# Patient Record
Sex: Male | Born: 1935 | Race: White | Hispanic: No | Marital: Married | Smoking: Never smoker
Health system: Southern US, Community
[De-identification: ages and names within clinical notes are randomized; demographics above are authoritative.]

## PROBLEM LIST (undated history)

## (undated) DIAGNOSIS — I10 Essential (primary) hypertension: Secondary | ICD-10-CM

## (undated) DIAGNOSIS — R0602 Shortness of breath: Secondary | ICD-10-CM

## (undated) DIAGNOSIS — C61 Malignant neoplasm of prostate: Secondary | ICD-10-CM

## (undated) DIAGNOSIS — R351 Nocturia: Secondary | ICD-10-CM

## (undated) DIAGNOSIS — R739 Hyperglycemia, unspecified: Secondary | ICD-10-CM

## (undated) DIAGNOSIS — N529 Male erectile dysfunction, unspecified: Secondary | ICD-10-CM

## (undated) DIAGNOSIS — E785 Hyperlipidemia, unspecified: Secondary | ICD-10-CM

## (undated) DIAGNOSIS — N189 Chronic kidney disease, unspecified: Secondary | ICD-10-CM

## (undated) DIAGNOSIS — D649 Anemia, unspecified: Secondary | ICD-10-CM

## (undated) DIAGNOSIS — I2510 Atherosclerotic heart disease of native coronary artery without angina pectoris: Secondary | ICD-10-CM

## (undated) DIAGNOSIS — E039 Hypothyroidism, unspecified: Secondary | ICD-10-CM

## (undated) DIAGNOSIS — I951 Orthostatic hypotension: Secondary | ICD-10-CM

## (undated) DIAGNOSIS — I4891 Unspecified atrial fibrillation: Principal | ICD-10-CM

## (undated) DIAGNOSIS — F411 Generalized anxiety disorder: Secondary | ICD-10-CM

## (undated) DIAGNOSIS — R251 Tremor, unspecified: Secondary | ICD-10-CM

## (undated) DIAGNOSIS — I5032 Chronic diastolic (congestive) heart failure: Secondary | ICD-10-CM

## (undated) DIAGNOSIS — Z950 Presence of cardiac pacemaker: Secondary | ICD-10-CM

## (undated) DIAGNOSIS — H269 Unspecified cataract: Secondary | ICD-10-CM

## (undated) DIAGNOSIS — I350 Nonrheumatic aortic (valve) stenosis: Secondary | ICD-10-CM

## (undated) DIAGNOSIS — J90 Pleural effusion, not elsewhere classified: Secondary | ICD-10-CM

## (undated) DIAGNOSIS — R439 Unspecified disturbances of smell and taste: Secondary | ICD-10-CM

## (undated) DIAGNOSIS — R413 Other amnesia: Secondary | ICD-10-CM

## (undated) DIAGNOSIS — K117 Disturbances of salivary secretion: Secondary | ICD-10-CM

## (undated) DIAGNOSIS — R5383 Other fatigue: Secondary | ICD-10-CM

## (undated) DIAGNOSIS — I440 Atrioventricular block, first degree: Secondary | ICD-10-CM

## (undated) DIAGNOSIS — R350 Frequency of micturition: Secondary | ICD-10-CM

## (undated) DIAGNOSIS — G4700 Insomnia, unspecified: Secondary | ICD-10-CM

## (undated) DIAGNOSIS — H9190 Unspecified hearing loss, unspecified ear: Secondary | ICD-10-CM

## (undated) DIAGNOSIS — N400 Enlarged prostate without lower urinary tract symptoms: Secondary | ICD-10-CM

## (undated) DIAGNOSIS — I498 Other specified cardiac arrhythmias: Secondary | ICD-10-CM

## (undated) DIAGNOSIS — R609 Edema, unspecified: Secondary | ICD-10-CM

## (undated) DIAGNOSIS — R5381 Other malaise: Secondary | ICD-10-CM

## (undated) DIAGNOSIS — I209 Angina pectoris, unspecified: Secondary | ICD-10-CM

## (undated) HISTORY — DX: Presence of cardiac pacemaker: Z95.0

## (undated) HISTORY — DX: Hyperglycemia, unspecified: R73.9

## (undated) HISTORY — DX: Tremor, unspecified: R25.1

## (undated) HISTORY — DX: Male erectile dysfunction, unspecified: N52.9

## (undated) HISTORY — DX: Generalized anxiety disorder: F41.1

## (undated) HISTORY — DX: Unspecified disturbances of smell and taste: R43.9

## (undated) HISTORY — DX: Insomnia, unspecified: G47.00

## (undated) HISTORY — DX: Other fatigue: R53.83

## (undated) HISTORY — DX: Malignant neoplasm of prostate: C61

## (undated) HISTORY — DX: Hypothyroidism, unspecified: E03.9

## (undated) HISTORY — DX: Other specified cardiac arrhythmias: I49.8

## (undated) HISTORY — DX: Edema, unspecified: R60.9

## (undated) HISTORY — DX: Unspecified cataract: H26.9

## (undated) HISTORY — DX: Other amnesia: R41.3

## (undated) HISTORY — DX: Disturbances of salivary secretion: K11.7

## (undated) HISTORY — DX: Other malaise: R53.81

## (undated) HISTORY — DX: Nocturia: R35.1

## (undated) HISTORY — DX: Anemia, unspecified: D64.9

## (undated) HISTORY — DX: Frequency of micturition: R35.0

## (undated) HISTORY — DX: Benign prostatic hyperplasia without lower urinary tract symptoms: N40.0

## (undated) HISTORY — DX: Unspecified hearing loss, unspecified ear: H91.90

---

## 1966-07-14 HISTORY — PX: HEMORROIDECTOMY: SUR656

## 1991-07-15 HISTORY — PX: SPINE SURGERY: SHX786

## 1999-07-15 LAB — HM COLONOSCOPY

## 1999-11-19 DIAGNOSIS — I498 Other specified cardiac arrhythmias: Secondary | ICD-10-CM

## 1999-11-19 HISTORY — DX: Other specified cardiac arrhythmias: I49.8

## 2000-06-12 ENCOUNTER — Other Ambulatory Visit: Admission: RE | Admit: 2000-06-12 | Discharge: 2000-06-12 | Payer: Self-pay | Admitting: Urology

## 2000-06-12 ENCOUNTER — Encounter (INDEPENDENT_AMBULATORY_CARE_PROVIDER_SITE_OTHER): Payer: Self-pay | Admitting: Specialist

## 2001-03-16 DIAGNOSIS — R439 Unspecified disturbances of smell and taste: Secondary | ICD-10-CM

## 2001-03-16 HISTORY — DX: Unspecified disturbances of smell and taste: R43.9

## 2003-02-22 DIAGNOSIS — R351 Nocturia: Secondary | ICD-10-CM

## 2003-02-22 HISTORY — DX: Nocturia: R35.1

## 2005-02-12 DIAGNOSIS — N400 Enlarged prostate without lower urinary tract symptoms: Secondary | ICD-10-CM

## 2005-02-12 DIAGNOSIS — R5381 Other malaise: Secondary | ICD-10-CM

## 2005-02-12 HISTORY — DX: Benign prostatic hyperplasia without lower urinary tract symptoms: N40.0

## 2005-02-12 HISTORY — DX: Other malaise: R53.81

## 2005-03-04 DIAGNOSIS — C61 Malignant neoplasm of prostate: Secondary | ICD-10-CM

## 2005-03-04 HISTORY — DX: Malignant neoplasm of prostate: C61

## 2005-03-20 ENCOUNTER — Ambulatory Visit: Admission: RE | Admit: 2005-03-20 | Discharge: 2005-06-30 | Payer: Self-pay | Admitting: Radiation Oncology

## 2005-05-09 DIAGNOSIS — G4700 Insomnia, unspecified: Secondary | ICD-10-CM

## 2005-05-09 DIAGNOSIS — R350 Frequency of micturition: Secondary | ICD-10-CM

## 2005-05-09 HISTORY — DX: Frequency of micturition: R35.0

## 2005-05-09 HISTORY — DX: Insomnia, unspecified: G47.00

## 2005-05-19 ENCOUNTER — Encounter: Admission: RE | Admit: 2005-05-19 | Discharge: 2005-05-19 | Payer: Self-pay | Admitting: Urology

## 2005-05-26 ENCOUNTER — Ambulatory Visit (HOSPITAL_BASED_OUTPATIENT_CLINIC_OR_DEPARTMENT_OTHER): Admission: RE | Admit: 2005-05-26 | Discharge: 2005-05-26 | Payer: Self-pay | Admitting: Urology

## 2005-05-26 ENCOUNTER — Ambulatory Visit (HOSPITAL_COMMUNITY): Admission: RE | Admit: 2005-05-26 | Discharge: 2005-05-26 | Payer: Self-pay | Admitting: Urology

## 2005-05-26 DIAGNOSIS — C61 Malignant neoplasm of prostate: Secondary | ICD-10-CM | POA: Insufficient documentation

## 2005-05-26 HISTORY — PX: PROSTATE SURGERY: SHX751

## 2006-07-14 HISTORY — PX: EYE SURGERY: SHX253

## 2006-12-09 DIAGNOSIS — N529 Male erectile dysfunction, unspecified: Secondary | ICD-10-CM

## 2006-12-09 HISTORY — DX: Male erectile dysfunction, unspecified: N52.9

## 2008-09-05 DIAGNOSIS — R0602 Shortness of breath: Secondary | ICD-10-CM

## 2008-09-05 DIAGNOSIS — R609 Edema, unspecified: Secondary | ICD-10-CM

## 2008-09-05 HISTORY — DX: Shortness of breath: R06.02

## 2008-09-05 HISTORY — DX: Edema, unspecified: R60.9

## 2008-09-06 ENCOUNTER — Encounter: Admission: RE | Admit: 2008-09-06 | Discharge: 2008-09-06 | Payer: Self-pay | Admitting: Internal Medicine

## 2009-07-25 DIAGNOSIS — K117 Disturbances of salivary secretion: Secondary | ICD-10-CM

## 2009-07-25 HISTORY — DX: Disturbances of salivary secretion: K11.7

## 2010-11-29 NOTE — Op Note (Signed)
NAME:  Barry Thompson, Barry Thompson                ACCOUNT NO.:  [id number]   MEDICAL RECORD NO.:  [id number]          PATIENT TYPE:  AMB   LOCATION:  [facility]                         FACILITY:  [facility]   PHYSICIAN:  Maretta Bees. Vonita Moss, M.D.DATE OF BIRTH:  03/13/1924   DATE OF PROCEDURE:  05/26/2005  DATE OF DISCHARGE:                                 OPERATIVE REPORT   PREOPERATIVE DIAGNOSES:  Carcinoma of the prostate.   POSTOPERATIVE DIAGNOSES:  Carcinoma of the prostate.   PROCEDURE:  Radioactive seed implantation of the prostate and cystoscopy.   SURGEON:  Maretta Bees. Vonita Moss, M.D.   ASSISTANT:  Artist Pais. Kathrynn Running, M.D.   HISTORY:  This 77 year old gentleman had a PSA that jumped up to 8.49 and  biopsy showed Gleason 7 carcinoma in the prostate. He and his wife were  counseled about his age but also his good health and they wanted the option  of radiation therapy. He consequently underwent radiotherapy consultation  and has undergone external beam radiation in preparation for completion of  his combined radiotherapy with radioactive seed implantation today.   DESCRIPTION OF PROCEDURE:  The patient was brought to the operating room,  placed in lithotomy position, Foley catheter and rectal tube inserted. A  transrectal ultrasound probe was inserted and treatment planning was  completed using the Nucletron device. After that using the perineal grid  with anchoring the needles in place, 22 treatment needles were placed under  fluoroscopic and computerized control delivering a total of 53 seeds and  with total activity of 22.6840 mCi. Fluoroscopy revealed good comparison to  the treatment planning schema. At this point, the Foley catheter was removed  and he was cystoscoped and there was no evidence of intraurethral or  intravesical seeds. The Foley was reinserted and the patient taken to the  recovery room in good condition with a perineal dressing in place with  minimal blood loss. He tolerated the  procedure well.      Maretta Bees. Vonita Moss, M.D.  Electronically Signed     LJP/MEDQ  D:  05/26/2005  T:  05/26/2005  Job:  [id number]   cc:   Artist Pais Kathrynn Running, M.D.  Fax: [phone]   [name]. Chilton Si, M.D.  Fax: [phone]

## 2011-02-04 DIAGNOSIS — H269 Unspecified cataract: Secondary | ICD-10-CM

## 2011-02-04 HISTORY — DX: Unspecified cataract: H26.9

## 2011-05-22 ENCOUNTER — Encounter: Payer: Self-pay | Admitting: Cardiovascular Disease

## 2011-05-22 ENCOUNTER — Ambulatory Visit (INDEPENDENT_AMBULATORY_CARE_PROVIDER_SITE_OTHER): Payer: Medicare Other | Admitting: Cardiovascular Disease

## 2011-05-22 DIAGNOSIS — I442 Atrioventricular block, complete: Secondary | ICD-10-CM | POA: Insufficient documentation

## 2011-05-22 DIAGNOSIS — E782 Mixed hyperlipidemia: Secondary | ICD-10-CM

## 2011-05-22 DIAGNOSIS — I059 Rheumatic mitral valve disease, unspecified: Secondary | ICD-10-CM

## 2011-05-22 DIAGNOSIS — I10 Essential (primary) hypertension: Secondary | ICD-10-CM

## 2011-05-22 DIAGNOSIS — I440 Atrioventricular block, first degree: Secondary | ICD-10-CM

## 2011-05-22 DIAGNOSIS — I350 Nonrheumatic aortic (valve) stenosis: Secondary | ICD-10-CM | POA: Insufficient documentation

## 2011-05-22 DIAGNOSIS — I359 Nonrheumatic aortic valve disorder, unspecified: Secondary | ICD-10-CM

## 2011-05-22 NOTE — Assessment & Plan Note (Signed)
NO indication for pacer.  Yearly ECG

## 2011-05-22 NOTE — Assessment & Plan Note (Signed)
Asymptomatic moderate to severe AS.  Patient F/U PRN no need to discuss TAVR further at this time.

## 2011-05-22 NOTE — Progress Notes (Signed)
77 yo of Art Green's with known AV disease.  Reviewed echos from 2009 and 8/12.  Last echo had normal EF mild to moderate LVH  Moderate to Severe AS  With mean gradient of 37 mmHg peak of 61 mmHg.  Also mild AR.  Patient is asymptomatic. No palpitations, syncope SSCP or dyspnea.  Fairly sedentary but does walk his Scotty Terrier without problems.  NO history of CAD or CHF.  Compliant with meds.  No indication for surgery or further w/u in asymptomatic 77 yo with non severe AS.  CRF;s elevated lipids and HTN on good Rx.  Chronic first degree heart block with no syncope or history of higher grade block.  Did discuss TAVR.  Risk of stroke still high.  Asked him if he wanted to F/U with Dr Excell Seltzer our TAVR "expert" and he declined.  Best approach is to have patient see [place] if he becomes symptomatic which is what he wants.  ROS: Denies fever, malais, weight loss, blurry vision, decreased visual acuity, cough, sputum, SOB, hemoptysis, pleuritic pain, palpitaitons, heartburn, abdominal pain, melena, lower extremity edema, claudication, or rash.  All other systems reviewed and negative   General: Affect appropriate Healthy:  appears stated age HEENT: normal Neck supple with no adenopathy JVP normal no bruits no thyromegaly Lungs clear with no wheezing and good diaphragmatic motion Heart:  S1/S2 muffled with severe AS murmur no ,rub, gallop or click PMI normal Abdomen: benighn, BS positve, no tenderness, no AAA no bruit.  No HSM or HJR Distal pulses intact with no bruits No edema Neuro non-focal Skin warm and dry No muscular weakness  Medications Current Outpatient Prescriptions  Medication Sig Dispense Refill  . aspirin 81 MG tablet Take 81 mg by mouth daily.        . cloNIDine (CATAPRES) 0.1 MG tablet Take 0.1 mg by mouth 2 (two) times daily.        . Fish Oil OIL 1 tablet by Does not apply route daily.        Marland Kitchen losartan-hydrochlorothiazide (HYZAAR) 100-12.5 MG per tablet Take 1 tablet by mouth  daily.        . Multiple Vitamin (MULTIVITAMIN) capsule Take 1 capsule by mouth daily.        . simvastatin (ZOCOR) 40 MG tablet Take 40 mg by mouth at bedtime.          Allergies Review of patient's allergies indicates no known allergies.  Family History: No family history on file.  Social History: History   Social History  . Marital Status: Married    Spouse Name: N/A    Number of Children: N/A  . Years of Education: N/A   Occupational History  . Not on file.   Social History Main Topics  . Smoking status: Never Smoker   . Smokeless tobacco: Not on file  . Alcohol Use: No  . Drug Use: No  . Sexually Active: Not on file   Other Topics Concern  . Not on file   Social History Narrative  . No narrative on file    Electrocardiogram: NSR 65 no BBB  PR 236  LAD LVH  Assessment and Plan

## 2011-05-22 NOTE — Assessment & Plan Note (Signed)
Well controlled.  Continue current medications and low sodium Dash type diet.    

## 2011-05-22 NOTE — Assessment & Plan Note (Signed)
Cholesterol is at goal.  Continue current dose of statin and diet Rx.  No myalgias or side effects.  F/U  LFT's in 6 months. No results found for this basename: LDLCALC             

## 2011-07-14 ENCOUNTER — Other Ambulatory Visit: Payer: Self-pay

## 2011-07-14 ENCOUNTER — Encounter (HOSPITAL_COMMUNITY): Payer: Self-pay | Admitting: *Deleted

## 2011-07-14 ENCOUNTER — Emergency Department (HOSPITAL_COMMUNITY): Payer: Medicare Other

## 2011-07-14 ENCOUNTER — Inpatient Hospital Stay (HOSPITAL_COMMUNITY)
Admission: EM | Admit: 2011-07-14 | Discharge: 2011-07-24 | DRG: 308 | Disposition: A | Discharge status: Skilled Nursing Facility | Payer: Medicare Other | Attending: Cardiology | Admitting: Cardiology

## 2011-07-14 DIAGNOSIS — I059 Rheumatic mitral valve disease, unspecified: Secondary | ICD-10-CM | POA: Diagnosis present

## 2011-07-14 DIAGNOSIS — N289 Disorder of kidney and ureter, unspecified: Secondary | ICD-10-CM

## 2011-07-14 DIAGNOSIS — I319 Disease of pericardium, unspecified: Secondary | ICD-10-CM | POA: Diagnosis present

## 2011-07-14 DIAGNOSIS — R079 Chest pain, unspecified: Secondary | ICD-10-CM | POA: Diagnosis present

## 2011-07-14 DIAGNOSIS — I951 Orthostatic hypotension: Secondary | ICD-10-CM | POA: Insufficient documentation

## 2011-07-14 DIAGNOSIS — I10 Essential (primary) hypertension: Secondary | ICD-10-CM | POA: Diagnosis present

## 2011-07-14 DIAGNOSIS — Z7982 Long term (current) use of aspirin: Secondary | ICD-10-CM

## 2011-07-14 DIAGNOSIS — I440 Atrioventricular block, first degree: Secondary | ICD-10-CM | POA: Diagnosis present

## 2011-07-14 DIAGNOSIS — I4891 Unspecified atrial fibrillation: Principal | ICD-10-CM | POA: Diagnosis not present

## 2011-07-14 DIAGNOSIS — I313 Pericardial effusion (noninflammatory): Secondary | ICD-10-CM

## 2011-07-14 DIAGNOSIS — J90 Pleural effusion, not elsewhere classified: Secondary | ICD-10-CM

## 2011-07-14 DIAGNOSIS — R0602 Shortness of breath: Secondary | ICD-10-CM

## 2011-07-14 DIAGNOSIS — I5033 Acute on chronic diastolic (congestive) heart failure: Secondary | ICD-10-CM | POA: Diagnosis present

## 2011-07-14 DIAGNOSIS — J441 Chronic obstructive pulmonary disease with (acute) exacerbation: Secondary | ICD-10-CM | POA: Diagnosis present

## 2011-07-14 DIAGNOSIS — E785 Hyperlipidemia, unspecified: Secondary | ICD-10-CM | POA: Diagnosis present

## 2011-07-14 DIAGNOSIS — I442 Atrioventricular block, complete: Secondary | ICD-10-CM | POA: Diagnosis present

## 2011-07-14 DIAGNOSIS — E782 Mixed hyperlipidemia: Secondary | ICD-10-CM | POA: Diagnosis present

## 2011-07-14 DIAGNOSIS — R0600 Dyspnea, unspecified: Secondary | ICD-10-CM | POA: Diagnosis present

## 2011-07-14 DIAGNOSIS — I509 Heart failure, unspecified: Secondary | ICD-10-CM

## 2011-07-14 DIAGNOSIS — I350 Nonrheumatic aortic (valve) stenosis: Secondary | ICD-10-CM | POA: Diagnosis present

## 2011-07-14 DIAGNOSIS — I359 Nonrheumatic aortic valve disorder, unspecified: Secondary | ICD-10-CM

## 2011-07-14 DIAGNOSIS — I3139 Other pericardial effusion (noninflammatory): Secondary | ICD-10-CM

## 2011-07-14 HISTORY — DX: Chronic diastolic (congestive) heart failure: I50.32

## 2011-07-14 HISTORY — DX: Unspecified atrial fibrillation: I48.91

## 2011-07-14 HISTORY — DX: Angina pectoris, unspecified: I20.9

## 2011-07-14 HISTORY — DX: Pleural effusion, not elsewhere classified: J90

## 2011-07-14 HISTORY — DX: Chronic kidney disease, unspecified: N18.9

## 2011-07-14 HISTORY — DX: Orthostatic hypotension: I95.1

## 2011-07-14 HISTORY — DX: Atrioventricular block, first degree: I44.0

## 2011-07-14 HISTORY — DX: Essential (primary) hypertension: I10

## 2011-07-14 HISTORY — DX: Shortness of breath: R06.02

## 2011-07-14 HISTORY — DX: Nonrheumatic aortic (valve) stenosis: I35.0

## 2011-07-14 HISTORY — DX: Hyperlipidemia, unspecified: E78.5

## 2011-07-14 LAB — COMPREHENSIVE METABOLIC PANEL
ALT: 15 U/L (ref 0–53)
AST: 21 U/L (ref 0–37)
Albumin: 3.9 g/dL (ref 3.5–5.2)
BUN: 17 mg/dL (ref 6–23)
CO2: 30 mEq/L (ref 19–32)
Calcium: 10.4 mg/dL (ref 8.4–10.5)
Chloride: 100 mEq/L (ref 96–112)
Creatinine, Ser: 0.92 mg/dL (ref 0.50–1.35)
GFR calc Af Amer: 87 mL/min — ABNORMAL LOW (ref >90)
GFR calc non Af Amer: 75 mL/min — ABNORMAL LOW (ref >90)
Total Bilirubin: 1 mg/dL (ref 0.3–1.2)
Total Protein: 7.5 g/dL (ref 6.0–8.3)

## 2011-07-14 LAB — DIFFERENTIAL
Basophils Absolute: 0.1 10*3/uL (ref 0.0–0.1)
Basophils Relative: 0 % (ref 0–1)
Eosinophils Absolute: 0 10*3/uL (ref 0.0–0.7)
Eosinophils Relative: 0 % (ref 0–5)
Lymphocytes Relative: 9 % — ABNORMAL LOW (ref 12–46)
Lymphs Abs: 1.2 10*3/uL (ref 0.7–4.0)
Monocytes Absolute: 1.2 10*3/uL — ABNORMAL HIGH (ref 0.1–1.0)
Monocytes Relative: 9 % (ref 3–12)
Neutro Abs: 11.5 10*3/uL — ABNORMAL HIGH (ref 1.7–7.7)
Neutrophils Relative %: 82 % — ABNORMAL HIGH (ref 43–77)

## 2011-07-14 LAB — PROTIME-INR
INR: 1.07 (ref 0.00–1.49)
Prothrombin Time: 14.1 seconds (ref 11.6–15.2)

## 2011-07-14 LAB — CBC
HCT: 45.5 % (ref 39.0–52.0)
Hemoglobin: 15.5 g/dL (ref 13.0–17.0)
MCH: 28.4 pg (ref 26.0–34.0)
MCHC: 34.1 g/dL (ref 30.0–36.0)
MCV: 83.3 fL (ref 78.0–100.0)
Platelets: 147 10*3/uL — ABNORMAL LOW (ref 150–400)
RDW: 13.7 % (ref 11.5–15.5)
WBC: 14 10*3/uL — ABNORMAL HIGH (ref 4.0–10.5)

## 2011-07-14 LAB — CARDIAC PANEL(CRET KIN+CKTOT+MB+TROPI): Troponin I: <0.3 ng/mL (ref <0.30)

## 2011-07-14 LAB — MRSA PCR SCREENING: MRSA by PCR: NEGATIVE

## 2011-07-14 LAB — APTT: aPTT: 31 seconds (ref 24–37)

## 2011-07-14 LAB — D-DIMER, QUANTITATIVE: D-Dimer, Quant: 0.46 ug/mL-FEU (ref 0.00–0.48)

## 2011-07-14 MED ORDER — HEPARIN BOLUS VIA INFUSION
4000.0000 [IU] | Freq: Once | INTRAVENOUS | Status: AC
  Administered 2011-07-14: 4000 [IU] via INTRAVENOUS
  Filled 2011-07-14: qty 4000

## 2011-07-14 MED ORDER — IPRATROPIUM BROMIDE 0.02 % IN SOLN
RESPIRATORY_TRACT | Status: AC
  Administered 2011-07-14: 0.5 mg via RESPIRATORY_TRACT
  Filled 2011-07-14: qty 2.5

## 2011-07-14 MED ORDER — SODIUM CHLORIDE 0.9 % IV SOLN
INTRAVENOUS | Status: DC
  Administered 2011-07-14: 10 mL/h via INTRAVENOUS
  Administered 2011-07-17: 12:00:00 via INTRAVENOUS

## 2011-07-14 MED ORDER — DILTIAZEM HCL 100 MG IV SOLR
10.0000 mg/h | INTRAVENOUS | Status: DC
  Administered 2011-07-14 (×3): 10 mg/h via INTRAVENOUS
  Administered 2011-07-15 – 2011-07-16 (×2): 5 mg/h via INTRAVENOUS
  Administered 2011-07-16 – 2011-07-18 (×4): 10 mg/h via INTRAVENOUS
  Filled 2011-07-14 (×3): qty 100

## 2011-07-14 MED ORDER — PATIENT'S GUIDE TO USING COUMADIN BOOK
Freq: Once | Status: DC
  Filled 2011-07-14: qty 1

## 2011-07-14 MED ORDER — MORPHINE SULFATE 4 MG/ML IJ SOLN
4.0000 mg | Freq: Once | INTRAMUSCULAR | Status: AC
  Administered 2011-07-14: 4 mg via INTRAVENOUS
  Filled 2011-07-14: qty 1

## 2011-07-14 MED ORDER — ADULT MULTIVITAMIN W/MINERALS CH
1.0000 | ORAL_TABLET | Freq: Every day | ORAL | Status: DC
  Administered 2011-07-15 – 2011-07-24 (×10): 1 via ORAL
  Filled 2011-07-14 (×11): qty 1

## 2011-07-14 MED ORDER — SODIUM CHLORIDE 0.9 % IV SOLN
Freq: Once | INTRAVENOUS | Status: AC
  Administered 2011-07-14: 125 mL/h via INTRAVENOUS

## 2011-07-14 MED ORDER — SIMVASTATIN 40 MG PO TABS
40.0000 mg | ORAL_TABLET | Freq: Every day | ORAL | Status: DC
  Administered 2011-07-14: 40 mg via ORAL
  Filled 2011-07-14 (×2): qty 1

## 2011-07-14 MED ORDER — FISH OIL OIL: 1.0000 | TOPICAL_OIL | Freq: Every day | Status: DC

## 2011-07-14 MED ORDER — SODIUM CHLORIDE 0.9 % IJ SOLN: 3.0000 mL | INTRAMUSCULAR | Status: DC | PRN

## 2011-07-14 MED ORDER — ALBUTEROL SULFATE (5 MG/ML) 0.5% IN NEBU
INHALATION_SOLUTION | RESPIRATORY_TRACT | Status: AC
  Administered 2011-07-14: 5 mg via RESPIRATORY_TRACT
  Filled 2011-07-14: qty 1

## 2011-07-14 MED ORDER — ASPIRIN 81 MG PO CHEW
81.0000 mg | CHEWABLE_TABLET | Freq: Every day | ORAL | Status: DC
  Administered 2011-07-15 – 2011-07-20 (×6): 81 mg via ORAL
  Filled 2011-07-14 (×5): qty 1

## 2011-07-14 MED ORDER — HYDROCHLOROTHIAZIDE 12.5 MG PO CAPS
12.5000 mg | ORAL_CAPSULE | Freq: Every day | ORAL | Status: DC
  Administered 2011-07-14 – 2011-07-15 (×2): 12.5 mg via ORAL
  Filled 2011-07-14 (×2): qty 1

## 2011-07-14 MED ORDER — FUROSEMIDE 10 MG/ML IJ SOLN
60.0000 mg | Freq: Once | INTRAMUSCULAR | Status: AC
  Administered 2011-07-14: 60 mg via INTRAVENOUS
  Filled 2011-07-14: qty 6

## 2011-07-14 MED ORDER — WARFARIN SODIUM 5 MG PO TABS
5.0000 mg | ORAL_TABLET | Freq: Once | ORAL | Status: AC
  Administered 2011-07-14: 5 mg via ORAL
  Filled 2011-07-14: qty 1

## 2011-07-14 MED ORDER — LOSARTAN POTASSIUM-HCTZ 100-12.5 MG PO TABS: 1.0000 | ORAL_TABLET | Freq: Every day | ORAL | Status: DC

## 2011-07-14 MED ORDER — CLONIDINE HCL 0.1 MG PO TABS
0.1000 mg | ORAL_TABLET | Freq: Every day | ORAL | Status: AC
  Administered 2011-07-15 – 2011-07-16 (×2): 0.1 mg via ORAL
  Filled 2011-07-14 (×3): qty 1

## 2011-07-14 MED ORDER — ALPRAZOLAM 0.25 MG PO TABS
0.2500 mg | ORAL_TABLET | Freq: Two times a day (BID) | ORAL | Status: DC | PRN
  Administered 2011-07-16 – 2011-07-22 (×7): 0.25 mg via ORAL
  Filled 2011-07-14 (×7): qty 1

## 2011-07-14 MED ORDER — WARFARIN VIDEO: Freq: Once | Status: DC

## 2011-07-14 MED ORDER — MULTIVITAMINS PO CAPS: 1.0000 | ORAL_CAPSULE | Freq: Every day | ORAL | Status: DC

## 2011-07-14 MED ORDER — SODIUM CHLORIDE 0.9 % IV SOLN
INTRAVENOUS | Status: DC
  Administered 2011-07-15: 20:00:00 via INTRAVENOUS

## 2011-07-14 MED ORDER — DILTIAZEM HCL 25 MG/5ML IV SOLN
10.0000 mg | Freq: Once | INTRAVENOUS | Status: AC
  Administered 2011-07-14: 10 mg via INTRAVENOUS
  Filled 2011-07-14: qty 5

## 2011-07-14 MED ORDER — ALBUTEROL SULFATE (5 MG/ML) 0.5% IN NEBU
2.5000 mg | INHALATION_SOLUTION | Freq: Four times a day (QID) | RESPIRATORY_TRACT | Status: AC | PRN
  Administered 2011-07-14: 5 mg via RESPIRATORY_TRACT
  Filled 2011-07-14: qty 0.5

## 2011-07-14 MED ORDER — SODIUM CHLORIDE 0.9 % IJ SOLN
3.0000 mL | Freq: Two times a day (BID) | INTRAMUSCULAR | Status: DC
  Administered 2011-07-14 – 2011-07-22 (×12): 3 mL via INTRAVENOUS
  Administered 2011-07-22: 22:00:00 via INTRAVENOUS
  Administered 2011-07-23: 3 mL via INTRAVENOUS

## 2011-07-14 MED ORDER — ASPIRIN 81 MG PO TABS: 81.0000 mg | ORAL_TABLET | Freq: Every day | ORAL | Status: DC

## 2011-07-14 MED ORDER — ONDANSETRON HCL 4 MG/2ML IJ SOLN: 4.0000 mg | Freq: Four times a day (QID) | INTRAMUSCULAR | Status: DC | PRN

## 2011-07-14 MED ORDER — TEMAZEPAM 7.5 MG PO CAPS
7.5000 mg | ORAL_CAPSULE | Freq: Once | ORAL | Status: AC
  Administered 2011-07-15: 7.5 mg via ORAL
  Filled 2011-07-14: qty 1

## 2011-07-14 MED ORDER — DILTIAZEM LOAD VIA INFUSION
20.0000 mg | Freq: Once | INTRAVENOUS | Status: AC
  Administered 2011-07-14: 20 mg via INTRAVENOUS
  Filled 2011-07-14: qty 20

## 2011-07-14 MED ORDER — LOSARTAN POTASSIUM 50 MG PO TABS
100.0000 mg | ORAL_TABLET | Freq: Every day | ORAL | Status: DC
  Administered 2011-07-14 – 2011-07-21 (×8): 100 mg via ORAL
  Filled 2011-07-14 (×9): qty 2

## 2011-07-14 MED ORDER — IPRATROPIUM BROMIDE 0.02 % IN SOLN
0.5000 mg | Freq: Four times a day (QID) | RESPIRATORY_TRACT | Status: AC | PRN
  Administered 2011-07-14: 0.5 mg via RESPIRATORY_TRACT
  Filled 2011-07-14: qty 2.5

## 2011-07-14 MED ORDER — HEPARIN SOD (PORCINE) IN D5W 100 UNIT/ML IV SOLN
1350.0000 [IU]/h | INTRAVENOUS | Status: DC
  Administered 2011-07-14 – 2011-07-15 (×2): 1350 [IU]/h via INTRAVENOUS
  Filled 2011-07-14 (×3): qty 250

## 2011-07-14 MED ORDER — ACETAMINOPHEN 325 MG PO TABS: 650.0000 mg | ORAL_TABLET | ORAL | Status: DC | PRN

## 2011-07-14 MED ORDER — OMEGA-3-ACID ETHYL ESTERS 1 G PO CAPS
1.0000 g | ORAL_CAPSULE | Freq: Every day | ORAL | Status: DC
  Administered 2011-07-15 – 2011-07-24 (×9): 1 g via ORAL
  Filled 2011-07-14 (×11): qty 1

## 2011-07-14 NOTE — Progress Notes (Signed)
Took patient off bipap and placed on a 2l [place] per MD request.  Patient tolerating well at this time.  Hr 88 spo2 96% on 2L [place]. bp 158/86/ BBS diminished and crackles.  RT will continue to monitor patient.

## 2011-07-14 NOTE — ED Notes (Signed)
Pt undressed and placed in gown. Pt placed on cardiac monitor, bp cuff, and pulse ox. Pt also placed on 2 L [place] O2 per protocol 

## 2011-07-14 NOTE — ED Notes (Signed)
Pt brought to room and started on a breathing tx. Pt unable to speak in complete sentences at this time. IV in place.

## 2011-07-14 NOTE — ED Notes (Signed)
Pt tolerating Bi-pap machine well at this time. Respirations not as labored, pt reports that his pain is better at this time. Family remains at the bedside.

## 2011-07-14 NOTE — ED Notes (Signed)
Pt removed from Bi-pap. Tolerating well, O2 sats at 97%. No acute distress noted at this time.

## 2011-07-14 NOTE — ED Notes (Signed)
Pt showed possible a-fib on the monitor. New EKG performed and given to Dr. Freida Busman.

## 2011-07-14 NOTE — ED Provider Notes (Addendum)
History     CSN: [id number]  Arrival date & time 07/14/11  1025   First MD Initiated Contact with Patient 07/14/11 1038      Chief Complaint  Patient presents with  . Shortness of Breath  . Chest Pain    (Consider location/radiation/quality/duration/timing/severity/associated sxs/prior treatment) Patient is a 77 y.o. male presenting with shortness of breath and chest pain. The history is provided by the patient. Language Interpreter Used: Need for intervention.  Shortness of Breath  Associated symptoms include chest pain and shortness of breath.  Chest Pain Primary symptoms include shortness of breath.    patient here with shortness of breath and coughing which started yesterday. Patient also notes left-sided chest pain which is been constant since then. He denies any fever, vomiting does note some diaphoresis. Symptoms worse with lying flat or exertion made better with nothing. No medications used prior to arrival. Denies any vomiting or diarrhea. She is a poor historian  Past Medical History  Diagnosis Date  . Hypertension     History reviewed. No pertinent past surgical history.  History reviewed. No pertinent family history.  History  Substance Use Topics  . Smoking status: Never Smoker   . Smokeless tobacco: Not on file  . Alcohol Use: No      Review of Systems  Respiratory: Positive for shortness of breath.   Cardiovascular: Positive for chest pain.  All other systems reviewed and are negative.    Allergies  Review of patient's allergies indicates no known allergies.  Home Medications   Current Outpatient Rx  Name Route Sig Dispense Refill  . ASPIRIN 81 MG PO TABS Oral Take 81 mg by mouth daily.      Marland Kitchen CLONIDINE HCL 0.1 MG PO TABS Oral Take 0.1 mg by mouth 2 (two) times daily.      Marland Kitchen FISH OIL OIL Does not apply 1 tablet by Does not apply route daily.      Marland Kitchen LOSARTAN POTASSIUM-HCTZ 100-12.5 MG PO TABS Oral Take 1 tablet by mouth daily.      .  MULTIVITAMINS PO CAPS Oral Take 1 capsule by mouth daily.      Marland Kitchen SIMVASTATIN 40 MG PO TABS Oral Take 40 mg by mouth at bedtime.        BP 232/97  Pulse 87  Temp(Src) 98.1 F (36.7 C) (Oral)  Resp 32  SpO2 96%  Physical Exam  Nursing note and vitals reviewed. Constitutional: He is oriented to person, place, and time. He appears well-developed and well-nourished.  Non-toxic appearance. No distress.  HENT:  Head: Normocephalic and atraumatic.  Eyes: Conjunctivae, EOM and lids are normal. Pupils are equal, round, and reactive to light.  Neck: Normal range of motion. Neck supple. No tracheal deviation present. No mass present.  Cardiovascular: Normal rate, regular rhythm and normal heart sounds.  Exam reveals no gallop.   No murmur heard. Pulmonary/Chest: Accessory muscle usage present. No stridor. Tachypnea noted. He is in respiratory distress. He has no decreased breath sounds. He has wheezes. He has rhonchi. He has no rales.  Abdominal: Soft. Normal appearance and bowel sounds are normal. He exhibits no distension. There is no tenderness. There is no rebound and no CVA tenderness.  Musculoskeletal: Normal range of motion. He exhibits no edema and no tenderness.  Neurological: He is alert and oriented to person, place, and time. He has normal strength. No cranial nerve deficit or sensory deficit. GCS eye subscore is 4. GCS verbal subscore is 5. GCS motor  subscore is 6.  Skin: Skin is warm. No abrasion and no rash noted. He is diaphoretic.  Psychiatric: His speech is normal and behavior is normal. His mood appears anxious.    ED Course  Procedures CRITICAL CARE Performed by: Toy Baker   Total critical care time: 75  Critical care time was exclusive of separately billable procedures and treating other patients.  Critical care was necessary to treat or prevent imminent or life-threatening deterioration.  Critical care was time spent personally by me on the following  activities: development of treatment plan with patient and/or surrogate as well as nursing, discussions with consultants, evaluation of patient's response to treatment, examination of patient, obtaining history from patient or surrogate, ordering and performing treatments and interventions, ordering and review of laboratory studies, ordering and review of radiographic studies, pulse oximetry and re-evaluation of patient's condition.  (including critical care time)   Labs Reviewed  CBC  DIFFERENTIAL  COMPREHENSIVE METABOLIC PANEL  I-STAT TROPONIN I  PROTIME-INR  APTT  PRO B NATRIURETIC PEPTIDE  D-DIMER, QUANTITATIVE   No results found.   No diagnosis found.    MDM   Date: 07/14/2011  Rate: 81  Rhythm: normal sinus rhythm  QRS Axis: right  Intervals: normal  ST/T Wave abnormalities: nonspecific ST changes  Conduction Disutrbances:first-degree A-V block   Narrative Interpretation:   Old EKG Reviewed: unchanged    1:03 PM Patient given Lasix 60 mg IV push for her treatment of his heart failure. He had been placed on BiPAP because of his worsening respiratory distress as well as he was given albuterol nebulizer. Patient has been reevaluated multiple times as the respiratory status is improving. Patient also given Cardizem for his rapid A. fib  Spoke with [facility] and patient will be admitted   Toy Baker, MD 07/14/11 1305  Toy Baker, MD 07/14/11 1308

## 2011-07-14 NOTE — ED Notes (Signed)
2923-01 READY 

## 2011-07-14 NOTE — Progress Notes (Signed)
ANTICOAGULATION CONSULT NOTE - Initial Consult  Pharmacy Consult for heparin and coumadin Indication: chest pain/ACS, atrial fibrillation with RVR  No Known Allergies  Patient Measurements: Height: 6\' 1"  (185.4 cm) Weight: 216 lb 11.4 oz (98.3 kg) IBW/kg (Calculated) : 79.9  Adjusted Body Weight: 95 kg  Vital Signs: Temp: 98.5 F (36.9 C) (12/31 1800) Temp src: Oral (12/31 1800) BP: 125/78 mmHg (12/31 1800) Pulse Rate: 87  (12/31 1800)  Labs:  Basename 07/14/11 1120  HGB 15.5  HCT 45.5  PLT 147*  APTT 31  LABPROT 14.1  INR 1.07  HEPARINUNFRC --  CREATININE 0.92  CKTOTAL --  CKMB --  TROPONINI --   Estimated Creatinine Clearance: 72.5 ml/min (by C-G formula based on Cr of 0.92).  Medical History: Past Medical History  Diagnosis Date  . Hypertension   . Hyperlipidemia   . Aortic stenosis   . 1st degree AV block     Medications:  Prescriptions prior to admission  Medication Sig Dispense Refill  . aspirin 81 MG tablet Take 81 mg by mouth daily.        . cloNIDine (CATAPRES) 0.1 MG tablet Take 0.1 mg by mouth 2 (two) times daily.        . Fish Oil OIL 1 tablet by Does not apply route daily.       Marland Kitchen losartan-hydrochlorothiazide (HYZAAR) 100-12.5 MG per tablet Take 1 tablet by mouth daily.        . Multiple Vitamin (MULTIVITAMIN) capsule Take 1 capsule by mouth daily.        . simvastatin (ZOCOR) 40 MG tablet Take 40 mg by mouth at bedtime.          Assessment: 77 yo male admitted with chest pain, new afib with RVR.  Pharmacy asked to begin anticoagulation with IV heparin and Coumadin.  Goal of Therapy:  INR 2-3, heparin level 0.3-0.7   Plan:  1. Heparin 4000 unit IV bolus x1, then start heparin gtt at 1350 units/hr.  2. Check heparin level in 8 hrs, then heparin level and CBC daily. 3. Coumadin 5 mg x1 now.  Daily PT/INR. 4. Will start Coumadin education.  Reece Leader, Pharm D 07/14/2011 7:17 PM    Norbert Malkin, Gwenlyn Found 07/14/2011,7:09 PM

## 2011-07-14 NOTE — ED Notes (Signed)
Pt complaining of left chest wall discomfort. Pt with labored respirations. O2 sats maintaining. EKG repeated. No changes. EDP notified and bipap ordered.

## 2011-07-14 NOTE — ED Notes (Signed)
Reports having sob and left side chest pain since last night, swelling to legs. ekg done at triage.

## 2011-07-14 NOTE — H&P (Signed)
History and Physical  Patient ID: SHREYAS PIATKOWSKI MRN: [id number], SOB: [birth year] 77 y.o. Date of Encounter: 07/14/2011, 3:30 PM  Primary Physician: Kimber Relic, MD, MD Primary Cardiologist: [name] MD  Chief Complaint: Shortness of breath and chest pain  HPI: 87yom w/ PMHx significant for HTN, Aortic stenosis, HLD, and 1st degree AV block who presented to [facility] 07[facility] with complaints of shortness of breath and chest pain.  Was last seen by Dr. Eden Emms in November for asymptomatic aortic stenosis found on echo. At that time he denied palpitations, syncope, chest pain, or dyspnea. Patient is a poor historian. He reports having shortness of breath over the last couple of months that worsened yesterday and was associated with some left sided chest pain that was worse with lying down. He reports orthopnea and nonproductive cough. He has intermittent lower ext edema that he says he has had for years and hasn't worsened recently. Denies fever, nausea, vomiting, weight gain, change in appetite, bowel or bladder habits. He denies any history of irregular heart rhythm or ischemic heart disease.   Last echo 02/2011 revealed mild-moderate concentric LVH, normal LV systolic function with EF >55%, impaired LV relaxation, No wall motion abnls, mod-severe LA dilation, mild-mod mitral annular calcification with trace MR, mod-severe aortic valve calcification with mod-severe AS and mild AR  Initial poc troponin normal, BNP 1319, electrolytes WNL, DDimer negative, WBC 14.0, CXR revealed cardiomegaly without pulmonary edema. Initial EKG revealed Sinus rhythm 81bpm, w/ 1st degree AV block, nonspecific ST changes. Was noted to be in a. Fib on monitor. Subsequent EKG revealed atrial fibrillation w/ RVR, 116bpm. His HR was noted to be as high as 140s. He received 60mg  IV Lasix, 10mg  diltiazem bolus, 4mg  MSO4, nebulizer treatment and placed on BiPAP. He currently denies any chest pain, but  remains mildly short of breath.   Diagnosis   HLD   Aortic Stenosis   1st degree AV block  . Hypertension    Surgical History: History reviewed. No pertinent past surgical history.   Home Meds: Medication Sig  aspirin 81 MG tablet Take 81 mg by mouth daily.    cloNIDine (CATAPRES) 0.1 MG tablet Take 0.1 mg by mouth 2 (two) times daily.    Fish Oil OIL 1 tablet by Does not apply route daily.    losartan-hydrochlorothiazide (HYZAAR) 100-12.5 MG per tablet Take 1 tablet by mouth daily.    Multiple Vitamin (MULTIVITAMIN) capsule Take 1 capsule by mouth daily.    simvastatin (ZOCOR) 40 MG tablet Take 40 mg by mouth at bedtime.      Allergies: No Known Allergies  History   Social History  . Marital Status: Married   Occupational History  . Retired   Social History Main Topics  . Smoking status: Never Smoker   . Alcohol Use: No  . Drug Use: No   History reviewed. No pertinent family history.  Review of Systems: General: negative for chills, fever, night sweats or weight changes.  Cardiovascular: As per HPI Dermatological: negative for rash Respiratory: As per HPI Urologic: negative for hematuria Abdominal: negative for nausea, vomiting, diarrhea, bright red blood per rectum, melena, or hematemesis Neurologic: negative for visual changes, syncope, or dizziness All other systems reviewed and are otherwise negative except as noted above.  Labs:   Lab Results  Component Value Date   WBC 14.0* 07/14/2011   HGB 15.5 07/14/2011   HCT 45.5 07/14/2011   MCV 83.3 07/14/2011   PLT 147* 07/14/2011  Lab 07/14/11 1120  NA 139  K 4.1  CL 100  CO2 30  BUN 17  CREATININE 0.92  CALCIUM 10.4  PROT 7.5  BILITOT 1.0  ALKPHOS 47  ALT 15  AST 21  GLUCOSE 107*   Lab Results  Component Value Date   DDIMER 0.46 07/14/2011     07/14/2011 11:20  Pro B Natriuretic peptide (BNP) 1319.0 (H)     07/14/2011 13:20  Troponin i, poc 0.02    Radiology/Studies:  Dg Chest  Port 1 View 07/14/2011  Findings: Suboptimal inspiration accounts for crowded bronchovascular markings, especially in the lung bases, and accentuates the cardiac silhouette.  This accounts for atelectasis in the lung bases, right greater than left.  Cardiac silhouette mildly to moderately enlarged.  Thoracic aorta atherosclerotic, unchanged.  Hilar and mediastinal contours otherwise unremarkable. Lungs otherwise clear.  No visible pleural effusions.  IMPRESSION: Suboptimal inspiration accounts for bibasilar atelectasis, right greater than left.  Cardiomegaly without pulmonary edema.    EKG: 07/14/11 @ 1020 - sinus rhythm w/ 1st degree AV block, 81bpm, nonspecific ST changes  07/14/11 @ 1142 - Atrial fibrillation 116, LVH  Physical Exam: Blood pressure 143/73, pulse 91, temperature 98.1 F (36.7 C), temperature source Oral, resp. rate 22, SpO2 100.00%. General: Well developed, well nourished, overweight elderly white male in no acute distress. Dyspnea with conversation Head: Normocephalic, atraumatic, sclera non-icteric, nares are without discharge Neck: Supple. Negative for carotid bruits. JVD not elevated. Lungs:  Scattered wheezes, bibasilar rales No rhonchi. Breathing is unlabored. Heart: RRR with S1 S2. No murmurs, rubs, or gallops appreciated. Abdomen: Soft, non-tender, non-distended with normoactive bowel sounds. No hepatomegaly. No rebound/guarding. No obvious abdominal masses. Msk:  Strength and tone appear normal for age. Extremities: No clubbing or cyanosis. No edema.  Distal pedal pulses are 2+ and equal bilaterally. Neuro: Alert and oriented X 3. Moves all extremities spontaneously. Psych:  Responds to questions appropriately with a normal affect.    ASSESSMENT AND PLAN:  87yom w/ PMHx significant for HTN, Aortic stenosis, HLD, and 1st degree AV block who presented to [facility] on [facility]14[facility]11 with complaints of shortness of breath and chest pain.   1. Dyspnea: Patient with  worsening dyspnea and orthopnea over the last couple of months. CXR revealed cardiomegaly without pulmonary edema or infiltrates, but BNP elevated at 1319 and rales on exam. Last Echo was in 02/2011 and revealed normal systolic function, but impaired relaxation and mod-severe AS. It is unclear if this exacerbation is related to worsening of AS or ?new onset a.fib. He received IV lasix in ED. Will rate control and assess for further need for diuresis. He has an elevated WBC count but is afebrile and no indication of pneumonia on CXR.   2. Chest Pain: No history of ischemic heart disease but has multiple cardiac risk factors. Likely secondary to atrial fibrillation and demand ischemia. Initial poc troponin is negative and EKG without acute ST changes. Cycle cardiac enzymes, monitor on tele, repeat EKGs PRN and in am. Cont ASA, statin, PRN nitro.  3. Atrial Fibrillation w/ RVR: No known history of atrial fibrillation. Patient denies feeling that his heart is racing so unsure if this is new. Rate 90s-110s after 10mg  IV cardizem bolus. Will need more aggressive rate control. Will give another IV cardizem bolus in ED then initiate drip. CHADS2 score 3. Will need anticoagulation. Initiate heparin and coumadin per pharmacy consult.  4. HTN: High upon presentation. Has improved with BiPAP and morphine. Will cont  home meds, decrease clonidine to 0.1mg  daily for 3 days then dc and add cardizem  5. HLD: Lipid panel in AM. Cont statin  Signed, Dung Prien PA-C 07/14/2011, 3:30 PM

## 2011-07-14 NOTE — ED Notes (Signed)
RT called for bipap set up

## 2011-07-15 DIAGNOSIS — I509 Heart failure, unspecified: Secondary | ICD-10-CM

## 2011-07-15 LAB — BASIC METABOLIC PANEL
BUN: 30 mg/dL — ABNORMAL HIGH (ref 6–23)
Calcium: 9.4 mg/dL (ref 8.4–10.5)
Chloride: 99 mEq/L (ref 96–112)
Creatinine, Ser: 1.33 mg/dL (ref 0.50–1.35)
GFR calc Af Amer: 55 mL/min — ABNORMAL LOW (ref >90)
GFR calc non Af Amer: 47 mL/min — ABNORMAL LOW (ref >90)

## 2011-07-15 LAB — CARDIAC PANEL(CRET KIN+CKTOT+MB+TROPI)
CK, MB: 3.8 ng/mL (ref 0.3–4.0)
CK, MB: 3.5 ng/mL (ref 0.3–4.0)
Relative Index: 1.7 (ref 0.0–2.5)
Total CK: 217 U/L (ref 7–232)
Troponin I: <0.3 ng/mL (ref <0.30)

## 2011-07-15 LAB — LIPID PANEL
Cholesterol: 131 mg/dL (ref 0–200)
LDL Cholesterol: 66 mg/dL (ref 0–99)
Total CHOL/HDL Ratio: 2.6 RATIO
VLDL: 14 mg/dL (ref 0–40)

## 2011-07-15 LAB — HEPARIN LEVEL (UNFRACTIONATED): Heparin Unfractionated: 0.44 IU/mL (ref 0.30–0.70)

## 2011-07-15 LAB — GLUCOSE, CAPILLARY: Glucose-Capillary: 122 mg/dL — ABNORMAL HIGH (ref 70–99)

## 2011-07-15 LAB — TSH: TSH: 3.545 u[IU]/mL (ref 0.350–4.500)

## 2011-07-15 MED ORDER — RIVAROXABAN 10 MG PO TABS
10.0000 mg | ORAL_TABLET | Freq: Every day | ORAL | Status: DC
  Filled 2011-07-15: qty 1

## 2011-07-15 MED ORDER — ALBUTEROL SULFATE (5 MG/ML) 0.5% IN NEBU
2.5000 mg | INHALATION_SOLUTION | Freq: Four times a day (QID) | RESPIRATORY_TRACT | Status: DC | PRN
  Administered 2011-07-15 – 2011-07-16 (×5): 2.5 mg via RESPIRATORY_TRACT
  Filled 2011-07-15 (×5): qty 0.5

## 2011-07-15 MED ORDER — BIOTENE DRY MOUTH MT LIQD
15.0000 mL | Freq: Two times a day (BID) | OROMUCOSAL | Status: DC
  Administered 2011-07-15 – 2011-07-24 (×17): 15 mL via OROMUCOSAL

## 2011-07-15 MED ORDER — RIVAROXABAN 10 MG PO TABS
20.0000 mg | ORAL_TABLET | Freq: Every day | ORAL | Status: DC
  Administered 2011-07-15 – 2011-07-18 (×4): 20 mg via ORAL
  Filled 2011-07-15 (×4): qty 2

## 2011-07-15 MED ORDER — ZOLPIDEM TARTRATE 5 MG PO TABS
5.0000 mg | ORAL_TABLET | Freq: Every evening | ORAL | Status: DC | PRN
  Administered 2011-07-15 – 2011-07-21 (×6): 5 mg via ORAL
  Filled 2011-07-15 (×7): qty 1

## 2011-07-15 MED ORDER — WARFARIN SODIUM 5 MG PO TABS
5.0000 mg | ORAL_TABLET | Freq: Once | ORAL | Status: DC
  Filled 2011-07-15: qty 1

## 2011-07-15 MED ORDER — AMIODARONE HCL 150 MG/3ML IV SOLN
30.0000 mg/h | INTRAVENOUS | Status: DC
  Administered 2011-07-15 – 2011-07-19 (×6): 30 mg/h via INTRAVENOUS
  Filled 2011-07-15 (×10): qty 9

## 2011-07-15 MED ORDER — ROSUVASTATIN CALCIUM 10 MG PO TABS
10.0000 mg | ORAL_TABLET | Freq: Every day | ORAL | Status: DC
  Administered 2011-07-15 – 2011-07-23 (×9): 10 mg via ORAL
  Filled 2011-07-15 (×10): qty 1

## 2011-07-15 MED ORDER — FUROSEMIDE 10 MG/ML IJ SOLN
20.0000 mg | Freq: Once | INTRAMUSCULAR | Status: AC
  Administered 2011-07-15: 20 mg via INTRAVENOUS
  Filled 2011-07-15: qty 2

## 2011-07-15 MED ORDER — DEXTROSE 5 % IV SOLN
60.0000 mg/h | INTRAVENOUS | Status: AC
  Administered 2011-07-15: 60 mg/h via INTRAVENOUS
  Filled 2011-07-15: qty 9

## 2011-07-15 NOTE — Progress Notes (Signed)
ANTICOAGULATION CONSULT NOTE - Follow Up Consult  Pharmacy Consult for heparin  Indication: chest pain/ACS, atrial fibrillation with RVR   No Known Allergies  Patient Measurements: Height: 6\' 1"  (185.4 cm) Weight: 219 lb 2.2 oz (99.4 kg) IBW/kg (Calculated) : 79.9  Adjusted Body Weight:   Vital Signs: Temp: 97.6 F (36.4 C) (01/01 0300) Temp src: Oral (01/01 0300) BP: 128/63 mmHg (01/01 0500) Pulse Rate: 72  (01/01 0500)  Labs:  Basename 07/15/11 0640 07/15/11 0103 07/14/11 1952 07/14/11 1120  HGB -- -- -- 15.5  HCT -- -- -- 45.5  PLT -- -- -- 147*  APTT -- -- -- 31  LABPROT -- -- -- 14.1  INR -- -- -- 1.07  HEPARINUNFRC 0.44 -- -- --  CREATININE -- -- -- 0.92  CKTOTAL -- 227 224 --  CKMB -- 3.8 4.3* --  TROPONINI -- <0.30 <0.30 --   Estimated Creatinine Clearance: 72.8 ml/min (by C-G formula based on Cr of 0.92).   Medications:  Infusions:    . sodium chloride 10 mL/hr at 07/14/11 1900  . sodium chloride 10 mL (07/14/11 2103)  . diltiazem (CARDIZEM) infusion 10 mg/hr (07/14/11 2159)  . heparin 1,350 Units/hr (07/14/11 2039)    Assessment: 87 yom on IV heparin/warfarin for chest pain/ACS, atrial fibrillation with RVR. Heparin now therapeutic at 0.44. No bleeding complications noted.  No INR ordered this am, will continue coumadin, no changes to heparin rate.  Goal of Therapy:  INR 2-3 Heparin level 0.3-0.7 units/ml   Plan:  1.Continue heparin at 1350 units/hr 2.Repeat warfarin 5mg  today 3.INR in am 4.Daily heparin level   Severiano Gilbert 07/15/2011,7:31 AM

## 2011-07-15 NOTE — Clinical Documentation Improvement (Signed)
CHF DOCUMENTATION CLARIFICATION QUERY  THIS DOCUMENT IS NOT A PERMANENT PART OF THE MEDICAL RECORD   Please update your documentation within the medical record to reflect your response to this query.                                                                                     07/15/11  Dr. Eden Emms,  In a better effort to capture your patient's severity of illness, reflect appropriate length of stay and utilization of resources, a review of the patient medical record has revealed the following indicators regarding Heart Failure.    Based on your clinical judgment, please document in the progress notes and discharge summary if a condition below provides greater specificity regarding the patient's cardiac and respiratory status on admission:  - acute diastolic heart failure with acute respiratory failure 2/2 atrial fibrillation with rvr  - atrial fibrillation with rvr with acute diastolic heart failure  - acute diastolic heart failure w/w atrial fib with rvr  - other condition - please specify  - unable to clinically determine  Clinical Information:   "Initial poc troponin normal, BNP 1319, electrolytes WNL, DDimer negative, WBC 14.0, CXR revealed cardiomegaly without pulmonary edema. Initial EKG revealed Sinus rhythm 81bpm, w/ 1st degree AV block, nonspecific ST changes. Was noted to be in a. Fib on monitor. Subsequent EKG revealed atrial fibrillation w/ RVR, 116bpm. His HR was noted to be as high as 140s. He received 60mg  IV Lasix, 10mg  diltiazem bolus, 4mg  MSO4, nebulizer treatment and placed on BiPAP. He currently denies any chest pain, but remains mildly short of breath." HOPE, JESSICA PA-C 07/14/2011, 3:30 PM  "Dyspnea: Patient with worsening dyspnea and orthopnea over the last couple of months. CXR revealed cardiomegaly without pulmonary edema or infiltrates, but BNP elevated at 1319 and rales on exam. Last Echo was in 02/2011 and revealed normal systolic function, but  impaired relaxation and mod-severe AS. It is unclear if this exacerbation is related to worsening of AS or ?new onset a.fib. He received IV lasix in ED. Will rate control and assess for further need for diuresis. He has an elevated WBC count but is afebrile and no indication of pneumonia on CXR." HOPE, JESSICA PA-C 07/14/2011, 3:30 PM  "Last echo 02/2011 revealed mild-moderate concentric LVH, normal LV systolic function with EF >55%, impaired LV relaxation, No wall motion abnls, mod-severe LA dilation, mild-mod mitral annular calcification with trace MR, mod-severe aortic valve calcification with mod-severe AS and mild AR" HOPE, JESSICA PA-C 07/14/2011, 3:30 PM  "Diastolic dysfunction: Lasix x1 related to AS/LVH BNP elevated" [name] 07/15/2011, 9:13 AM  "Afib: Rate control is ok. Discussed options Given AS and dyspnea feel facilitated [facility] sooner is in order. Will start Xeralto ( no history of CVA)" [name] 07/15/2011, 9:13 AM   In responding to this query please exercise your independent judgment.  The fact that a query is asked, does not imply that any particular answer is desired or expected.   Reviewed:   "Acute Diastolic Heart Failure" documented by Berton Mount NP 07/18/11 pn.  Mathis Dad RN  Thank You,  Jerral Ralph  RN BSN Certified Clinical Documentation Specialist:  Cell   [phone]  Health Information Management [place]  TO RESPOND TO THE THIS QUERY, FOLLOW THE INSTRUCTIONS BELOW:  1. If needed, update documentation for the patient's encounter via the notes activity.  2. Access this query again and click edit on the In [employer].  3. After updating, or not, click F2 to complete all highlighted (required) fields concerning your review. Select "additional documentation in the medical record" OR "no additional documentation provided".  4. Click Sign note button.  5. The deficiency will fall out of your In Basket *Please let us know if you are not  able to complete this workflow by phone or e-mail (listed below).

## 2011-07-15 NOTE — Progress Notes (Signed)
Patient ID: Barry Thompson, male   DOB: 09/27/1923, 78 y.o.   MRN: [id number] @ Subjective:  Denies SSCP, palpitations  Dyspnea is improved   Objective:  Filed Vitals:   07/15/11 0300 07/15/11 0400 07/15/11 0500 07/15/11 0740  BP: 128/63  128/63 104/92  Pulse: 79 77 72 69  Temp: 97.6 F (36.4 C)   97.9 F (36.6 C)  TempSrc: Oral   Oral  Resp: 20 19 21 24   Height:      Weight:  99.4 kg (219 lb 2.2 oz)    SpO2: 97% 96% 98% 98%    Intake/Output from previous day:  Intake/Output Summary (Last 24 hours) at 07/15/11 0913 Last data filed at 07/15/11 0905  Gross per 24 hour  Intake 1317.9 ml  Output    570 ml  Net  747.9 ml    Physical Exam: General appearance: alert and no distress Neck: no adenopathy, no carotid bruit, no JVD, supple, symmetrical, trachea midline and thyroid not enlarged, symmetric, no tenderness/mass/nodules Lungs: clear to auscultation bilaterally Heart: AS murmur Abdomen: soft, non-tender; bowel sounds normal; no masses,  no organomegaly Extremities: extremities normal, atraumatic, no cyanosis or edema Pulses: 2+ and symmetric Neurologic: Grossly normal  Lab Results: Basic Metabolic Panel:  Basename 07/15/11 0640 07/14/11 1120  NA 134* 139  K 4.5 4.1  CL 99 100  CO2 25 30  GLUCOSE 131* 107*  BUN 30* 17  CREATININE 1.33 0.92  CALCIUM 9.4 10.4  MG -- --  PHOS -- --   Liver Function Tests:  Basename 07/14/11 1120  AST 21  ALT 15  ALKPHOS 47  BILITOT 1.0  PROT 7.5  ALBUMIN 3.9   No results found for this basename: LIPASE:2,AMYLASE:2 in the last 72 hours CBC:  Basename 07/14/11 1120  WBC 14.0*  NEUTROABS 11.5*  HGB 15.5  HCT 45.5  MCV 83.3  PLT 147*   Cardiac Enzymes:  Basename 07/15/11 0103 07/14/11 1952  CKTOTAL 227 224  CKMB 3.8 4.3*  CKMBINDEX -- --  TROPONINI <0.30 <0.30   BNP: No components found with this basename: POCBNP:3 D-Dimer:  Basename 07/14/11 1120  DDIMER 0.46   Hemoglobin A1C: No results found for this  basename: HGBA1C in the last 72 hours Fasting Lipid Panel:  Basename 07/15/11 0640  CHOL 131  HDL 51  LDLCALC 66  TRIG 69  CHOLHDL 2.6  LDLDIRECT --   Thyroid Function Tests:  Basename 07/14/11 1953  TSH 3.545  T4TOTAL --  T3FREE --  THYROIDAB --   Anemia Panel: No results found for this basename: VITAMINB12,FOLATE,FERRITIN,TIBC,IRON,RETICCTPCT in the last 72 hours  Imaging: Dg Chest Port 1 View  07/14/2011  *RADIOLOGY REPORT*  Clinical Data: Severe left-sided chest pain.  Diaphoresis.  Acute hypertension.  PORTABLE CHEST - 1 VIEW 07/14/2011 1109 hours:  Comparison: Two-view chest x-ray 09/06/2008, 05/19/2005 [facility].  Findings: Suboptimal inspiration accounts for crowded bronchovascular markings, especially in the lung bases, and accentuates the cardiac silhouette.  This accounts for atelectasis in the lung bases, right greater than left.  Cardiac silhouette mildly to moderately enlarged.  Thoracic aorta atherosclerotic, unchanged.  Hilar and mediastinal contours otherwise unremarkable. Lungs otherwise clear.  No visible pleural effusions.  IMPRESSION: Suboptimal inspiration accounts for bibasilar atelectasis, right greater than left.  Cardiomegaly without pulmonary edema.  Original Report Authenticated By: Arnell Sieving, M.D.    Cardiac Studies:  ECG:  Atrial fibrillation with PVC IVCD  Rate 88 no acute ischemic changes   Telemetry: Atrial fibrillation  Echo:  Mean gradient 37 peak 61 EF normal  Done at [facility] 8/12  Medications:     . sodium chloride   Intravenous Once  . aspirin  81 mg Oral Daily  . cloNIDine  0.1 mg Oral Daily  . diltiazem  20 mg Intravenous Once  . diltiazem  10 mg Intravenous Once  . furosemide  60 mg Intravenous Once  . heparin  4,000 Units Intravenous Once  . hydrochlorothiazide  12.5 mg Oral Daily  . losartan  100 mg Oral Daily  .  morphine injection  4 mg Intravenous Once  . mulitivitamin with minerals  1 tablet Oral Daily  .  omega-3 acid ethyl esters  1 g Oral Daily  . patient's guide to using coumadin book   Does not apply Once  . rosuvastatin  10 mg Oral q1800  . sodium chloride  3 mL Intravenous Q12H  . temazepam  7.5 mg Oral Once  . warfarin  5 mg Oral Once  . warfarin  5 mg Oral ONCE-1800  . warfarin   Does not apply Once  . DISCONTD: aspirin  81 mg Oral Daily  . DISCONTD: Fish Oil  1 tablet Does not apply Daily  . DISCONTD: losartan-hydrochlorothiazide  1 tablet Oral Daily  . DISCONTD: multivitamin  1 capsule Oral Daily  . DISCONTD: simvastatin  40 mg Oral QHS       . sodium chloride 10 mL/hr at 07/14/11 1900  . sodium chloride 10 mL (07/14/11 2103)  . diltiazem (CARDIZEM) infusion 5 mg/hr (07/15/11 0855)  . heparin 1,350 Units/hr (07/15/11 0905)    Assessment/Plan:  Afib:  Rate control is ok.  Discussed options Given AS and dyspnea feel facilitated [facility] sooner is in order.  Will start Xeralto ( no history of CVA) Rate control is ok.  TEE/DCC Thursday.  Risk of stroke and reason for TEE discussed.  He has had increased dyspnea for 2-3 months Was in NSR 11/12 when  He saw me in office AS:  We discussed options.  He indicates not wanting to pursue cath or TAVR at [facility] at this time.  I told him he would likely continue to be dyspnic but getting  Him out of afib should help Diastolic dysfunction:  Lasix x1 related to AS/LVH  BNP elevated Cholesterol:  Continue statin.  [name] 07/15/2011, 9:13 AM

## 2011-07-15 NOTE — Progress Notes (Signed)
Pharmacy asked to review patient's medications for any drug-drug interactions with amiodarone.  Only major interaction in Lexi is possible enhanced bradycardic effects with Amiodarone and CCBs (non-DHP), but pt being monitored closely.  Also, pt has been switched from simvastatin to crestor to reduce risk of rhabdomyoysis.     Haynes Hoehn, PharmD 07/15/2011 10:37 AM  Pager: [phone]

## 2011-07-16 LAB — BASIC METABOLIC PANEL
CO2: 26 mEq/L (ref 19–32)
Chloride: 94 mEq/L — ABNORMAL LOW (ref 96–112)
Creatinine, Ser: 1.25 mg/dL (ref 0.50–1.35)
GFR calc Af Amer: 59 mL/min — ABNORMAL LOW (ref >90)
Potassium: 4 mEq/L (ref 3.5–5.1)
Sodium: 131 mEq/L — ABNORMAL LOW (ref 135–145)

## 2011-07-16 MED ORDER — MAGNESIUM HYDROXIDE 400 MG/5ML PO SUSP
30.0000 mL | Freq: Every day | ORAL | Status: DC | PRN
  Administered 2011-07-16 – 2011-07-23 (×2): 30 mL via ORAL
  Filled 2011-07-16 (×2): qty 30

## 2011-07-16 MED ORDER — LEVALBUTEROL HCL 1.25 MG/0.5ML IN NEBU
1.2500 mg | INHALATION_SOLUTION | Freq: Three times a day (TID) | RESPIRATORY_TRACT | Status: DC
  Administered 2011-07-16 – 2011-07-17 (×6): 1.25 mg via RESPIRATORY_TRACT
  Filled 2011-07-16 (×9): qty 0.5

## 2011-07-16 NOTE — H&P (Signed)
Agree with discharge summary and outpatient plan.

## 2011-07-16 NOTE — Progress Notes (Signed)
Received a call from RN about pt wheezing. He has received albuterol nebulizer treatments with improvement. Because he has received multiple treatments over the last 48 hours, I added scheduled Xopenex nebulizers for less effect on his heart rate. These will be every 8 hours. He can continue to get the albuterol when necessary.

## 2011-07-16 NOTE — Progress Notes (Signed)
Patient experienced profound exertional dyspnea throughout the night.  Every time patient stood at the bedside he became markedly  SOB & tachypneic with RR as high as 40 bpm. BP was also elevated from 180's to 190's systolic until he recovered.  Inspiratory & expiratory wheezes were audible throughout, with breath sounds more diminished.  This occurred three times with patient standing at bedside, and then again when he remained in bed and used the urinal. Recovery time varied from 15-30 minutes of rest, at which time breath sounds were improved (less or no wheezes), and improved air excursion.

## 2011-07-16 NOTE — Plan of Care (Signed)
Problem: Phase I Progression Outcomes Goal: EF % per last Echo/documented,Core Reminder form on chart Outcome: Progressing EF >55%

## 2011-07-16 NOTE — Progress Notes (Signed)
Patient ID: Barry Thompson, male   DOB: 1923-09-25, 78 y.o.   MRN: [id number] @ Subjective:  Denies SSCP, palpitations  Dyspnea is persistant   Objective:  Filed Vitals:   07/16/11 0500 07/16/11 0600 07/16/11 0751 07/16/11 0758  BP:  134/62 167/88 167/88  Pulse:  43 97 86  Temp:   98.1 F (36.7 C)   TempSrc:   Oral   Resp:  32 25 27  Height:      Weight: 99.1 kg (218 lb 7.6 oz)     SpO2:  99% 98% 98%    Intake/Output from previous day:  Intake/Output Summary (Last 24 hours) at 07/16/11 1010 Last data filed at 07/16/11 0800  Gross per 24 hour  Intake 1619.2 ml  Output   1250 ml  Net  369.2 ml    Physical Exam: General appearance: alert and no distress Neck: no adenopathy, no carotid bruit, no JVD, supple, symmetrical, trachea midline and thyroid not enlarged, symmetric, no tenderness/mass/nodules Lungs: clear to auscultation bilaterally Heart: AS murmur Abdomen: soft, non-tender; bowel sounds normal; no masses,  no organomegaly Extremities: extremities normal, atraumatic, no cyanosis or edema Pulses: 2+ and symmetric Neurologic: Grossly normal  Lab Results: Basic Metabolic Panel:  Basename 07/16/11 0552 07/15/11 0640  NA 131* 134*  K 4.0 4.5  CL 94* 99  CO2 26 25  GLUCOSE 118* 131*  BUN 38* 30*  CREATININE 1.25 1.33  CALCIUM 9.8 9.4  MG -- --  PHOS -- --   Liver Function Tests:  Basename 07/14/11 1120  AST 21  ALT 15  ALKPHOS 47  BILITOT 1.0  PROT 7.5  ALBUMIN 3.9   No results found for this basename: LIPASE:2,AMYLASE:2 in the last 72 hours CBC:  Basename 07/14/11 1120  WBC 14.0*  NEUTROABS 11.5*  HGB 15.5  HCT 45.5  MCV 83.3  PLT 147*   Cardiac Enzymes:  Basename 07/15/11 0945 07/15/11 0103 07/14/11 1952  CKTOTAL 217 227 224  CKMB 3.5 3.8 4.3*  CKMBINDEX -- -- --  TROPONINI <0.30 <0.30 <0.30   BNP: No components found with this basename: POCBNP:3 D-Dimer:  Basename 07/14/11 1120  DDIMER 0.46   Hemoglobin A1C: No results found  for this basename: HGBA1C in the last 72 hours Fasting Lipid Panel:  Basename 07/15/11 0640  CHOL 131  HDL 51  LDLCALC 66  TRIG 69  CHOLHDL 2.6  LDLDIRECT --   Thyroid Function Tests:  Basename 07/14/11 1953  TSH 3.545  T4TOTAL --  T3FREE --  THYROIDAB --   Anemia Panel: No results found for this basename: VITAMINB12,FOLATE,FERRITIN,TIBC,IRON,RETICCTPCT in the last 72 hours  Imaging: Dg Chest Port 1 View  07/14/2011  *RADIOLOGY REPORT*  Clinical Data: Severe left-sided chest pain.  Diaphoresis.  Acute hypertension.  PORTABLE CHEST - 1 VIEW 07/14/2011 1109 hours:  Comparison: Two-view chest x-ray 09/06/2008, 05/19/2005 [facility].  Findings: Suboptimal inspiration accounts for crowded bronchovascular markings, especially in the lung bases, and accentuates the cardiac silhouette.  This accounts for atelectasis in the lung bases, right greater than left.  Cardiac silhouette mildly to moderately enlarged.  Thoracic aorta atherosclerotic, unchanged.  Hilar and mediastinal contours otherwise unremarkable. Lungs otherwise clear.  No visible pleural effusions.  IMPRESSION: Suboptimal inspiration accounts for bibasilar atelectasis, right greater than left.  Cardiomegaly without pulmonary edema.  Original Report Authenticated By: Arnell Sieving, M.D.    Cardiac Studies:  ECG:  Atrial fibrillation with PVC IVCD  Rate 88 no acute ischemic changes   Telemetry: Atrial  fibrillation  Echo:  Mean gradient 37 peak 61 EF normal  Done at [facility] 8/12  Medications:      . antiseptic oral rinse  15 mL Mouth Rinse BID  . aspirin  81 mg Oral Daily  . cloNIDine  0.1 mg Oral Daily  . furosemide  20 mg Intravenous Once  . losartan  100 mg Oral Daily  . mulitivitamin with minerals  1 tablet Oral Daily  . omega-3 acid ethyl esters  1 g Oral Daily  . patient's guide to using coumadin book   Does not apply Once  . rivaroxaban  20 mg Oral Daily  . rosuvastatin  10 mg Oral q1800  . sodium  chloride  3 mL Intravenous Q12H  . DISCONTD: rivaroxaban  10 mg Oral Daily        . sodium chloride 10 mL/hr at 07/14/11 1900  . sodium chloride 10 mL/hr at 07/15/11 2014  . amiodarone (CORDARONE) infusion 30 mg/hr (07/15/11 1650)  . amiodarone (CORDARONE) infusion 30 mg/hr (07/15/11 2011)  . diltiazem (CARDIZEM) infusion 5 mg/hr (07/16/11 0509)    Assessment/Plan:  Afib:  Rate control is ok.  Discussed options Given AS and dyspnea feel facilitated [facility] sooner is in order.  Day 2 Xarelto   Rate control is ok.  TEE/DCC Thursday.  Risk of stroke and reason for TEE discussed.  He has had increased dyspnea for 2-3 months Was in NSR 11/12 when  He saw me in office AS:  We discussed options.  He indicates not wanting to pursue cath or [facility]AV[facility] at [facility] at this time.  I told him he would likely continue to be dyspnic but getting  Him out of afib should help Diastolic dysfunction:  Lasix x1 related to AS/LVH   F/U BNP   Cholesterol:  Continue statin.  [name] 07/16/2011, 10:10 AM

## 2011-07-17 ENCOUNTER — Encounter (HOSPITAL_COMMUNITY): Payer: Self-pay

## 2011-07-17 ENCOUNTER — Inpatient Hospital Stay (HOSPITAL_COMMUNITY): Payer: Medicare Other | Admitting: Anesthesiology

## 2011-07-17 ENCOUNTER — Encounter (HOSPITAL_COMMUNITY): Payer: Self-pay | Admitting: Anesthesiology

## 2011-07-17 ENCOUNTER — Encounter (HOSPITAL_COMMUNITY)
Admission: EM | Disposition: A | Discharge status: Skilled Nursing Facility | Payer: Self-pay | Source: Home / Self Care | Attending: Cardiology

## 2011-07-17 DIAGNOSIS — I4891 Unspecified atrial fibrillation: Secondary | ICD-10-CM

## 2011-07-17 HISTORY — PX: TEE WITHOUT CARDIOVERSION: SHX5443

## 2011-07-17 LAB — BASIC METABOLIC PANEL
CO2: 29 mEq/L (ref 19–32)
Calcium: 9.8 mg/dL (ref 8.4–10.5)
Chloride: 93 mEq/L — ABNORMAL LOW (ref 96–112)
Creatinine, Ser: 1.63 mg/dL — ABNORMAL HIGH (ref 0.50–1.35)
GFR calc Af Amer: 43 mL/min — ABNORMAL LOW (ref >90)
Sodium: 133 mEq/L — ABNORMAL LOW (ref 135–145)

## 2011-07-17 SURGERY — ECHOCARDIOGRAM, TRANSESOPHAGEAL
Anesthesia: Moderate Sedation

## 2011-07-17 MED ORDER — BUTAMBEN-TETRACAINE-BENZOCAINE 2-2-14 % EX AERO
INHALATION_SPRAY | CUTANEOUS | Status: DC | PRN
  Administered 2011-07-17: 2 via TOPICAL

## 2011-07-17 MED ORDER — IPRATROPIUM BROMIDE 0.02 % IN SOLN
0.5000 mg | RESPIRATORY_TRACT | Status: DC
  Administered 2011-07-17 – 2011-07-21 (×23): 0.5 mg via RESPIRATORY_TRACT
  Filled 2011-07-17 (×20): qty 2.5

## 2011-07-17 MED ORDER — IPRATROPIUM BROMIDE 0.02 % IN SOLN: 0.5000 mg | RESPIRATORY_TRACT | Status: DC | PRN

## 2011-07-17 MED ORDER — LEVALBUTEROL HCL 0.63 MG/3ML IN NEBU
0.6300 mg | INHALATION_SOLUTION | RESPIRATORY_TRACT | Status: DC
  Administered 2011-07-17 – 2011-07-21 (×23): 0.63 mg via RESPIRATORY_TRACT
  Filled 2011-07-17 (×29): qty 3

## 2011-07-17 MED ORDER — MIDAZOLAM HCL 10 MG/2ML IJ SOLN
INTRAMUSCULAR | Status: DC | PRN
  Administered 2011-07-17 (×2): 1 mg via INTRAVENOUS

## 2011-07-17 MED ORDER — FENTANYL CITRATE 0.05 MG/ML IJ SOLN
INTRAMUSCULAR | Status: AC
  Filled 2011-07-17: qty 2

## 2011-07-17 MED ORDER — MIDAZOLAM HCL 10 MG/2ML IJ SOLN
INTRAMUSCULAR | Status: AC
  Filled 2011-07-17: qty 2

## 2011-07-17 MED ORDER — LEVALBUTEROL HCL 0.63 MG/3ML IN NEBU
0.6300 mg | INHALATION_SOLUTION | Freq: Once | RESPIRATORY_TRACT | Status: DC
  Filled 2011-07-17: qty 3

## 2011-07-17 MED ORDER — LEVALBUTEROL HCL 0.63 MG/3ML IN NEBU
0.6300 mg | INHALATION_SOLUTION | RESPIRATORY_TRACT | Status: DC | PRN
  Filled 2011-07-17: qty 3

## 2011-07-17 MED ORDER — IPRATROPIUM BROMIDE 0.02 % IN SOLN
0.5000 mg | Freq: Once | RESPIRATORY_TRACT | Status: DC
  Filled 2011-07-17 (×4): qty 2.5

## 2011-07-17 MED ORDER — FENTANYL CITRATE 0.05 MG/ML IJ SOLN: 250.0000 ug | Freq: Once | INTRAMUSCULAR | Status: DC

## 2011-07-17 MED ORDER — MIDAZOLAM HCL 10 MG/2ML IJ SOLN: 10.0000 mg | Freq: Once | INTRAMUSCULAR | Status: DC

## 2011-07-17 MED ORDER — BENZOCAINE 20 % MT SOLN
1.0000 "application " | OROMUCOSAL | Status: DC | PRN
  Filled 2011-07-17: qty 57

## 2011-07-17 NOTE — Progress Notes (Signed)
  Echocardiogram Echocardiogram Transesophageal has been performed.  Juanita Laster Lekeisha Arenas 07/17/2011, 12:37 PM

## 2011-07-17 NOTE — Progress Notes (Signed)
Patient ID: Barry Thompson, male   DOB: 11/23/1923, 78 y.o.   MRN: [id number] @ Subjective:  Denies SSCP, palpitations  Dyspnea is persistant  He appears to be acting more like COPD exacerbation With relief using nebulizers   Objective:  Filed Vitals:   07/17/11 0400 07/17/11 0500 07/17/11 0600 07/17/11 0731  BP: 110/58     Pulse:      Temp: 97.3 F (36.3 C)   97.7 F (36.5 C)  TempSrc: Axillary   Oral  Resp: 26 21 20   Height:      Weight:  101.6 kg (223 lb 15.8 oz)    SpO2: 93%       Intake/Output from previous day:  Intake/Output Summary (Last 24 hours) at 07/17/11 0901 Last data filed at 07/17/11 0600  Gross per 24 hour  Intake 1240.7 ml  Output    525 ml  Net  715.7 ml    Physical Exam: General appearance: alert and no distress Neck: no adenopathy, no carotid bruit, no JVD, supple, symmetrical, trachea midline and thyroid not enlarged, symmetric, no tenderness/mass/nodules Lungs: expitory wheezing right base greater than left Heart: AS murmur Abdomen: soft, non-tender; bowel sounds normal; no masses,  no organomegaly Extremities: extremities normal, atraumatic, no cyanosis or edema Pulses: 2+ and symmetric Neurologic: Grossly normal  Lab Results: Basic Metabolic Panel:  Basename 07/17/11 0450 07/16/11 0552  NA 133* 131*  K 4.1 4.0  CL 93* 94*  CO2 29 26  GLUCOSE 121* 118*  BUN 46* 38*  CREATININE 1.63* 1.25  CALCIUM 9.8 9.8  MG -- --  PHOS -- --   Liver Function Tests:  Basename 07/14/11 1120  AST 21  ALT 15  ALKPHOS 47  BILITOT 1.0  PROT 7.5  ALBUMIN 3.9   No results found for this basename: LIPASE:2,AMYLASE:2 in the last 72 hours CBC:  Basename 07/14/11 1120  WBC 14.0*  NEUTROABS 11.5*  HGB 15.5  HCT 45.5  MCV 83.3  PLT 147*   Cardiac Enzymes:  Basename 07/15/11 0945 07/15/11 0103 07/14/11 1952  CKTOTAL 217 227 224  CKMB 3.5 3.8 4.3*  CKMBINDEX -- -- --  TROPONINI <0.30 <0.30 <0.30   BNP: No components found with this  basename: POCBNP:3 D-Dimer:  Basename 07/14/11 1120  DDIMER 0.46   Hemoglobin A1C: No results found for this basename: HGBA1C in the last 72 hours Fasting Lipid Panel:  Basename 07/15/11 0640  CHOL 131  HDL 51  LDLCALC 66  TRIG 69  CHOLHDL 2.6  LDLDIRECT --   Thyroid Function Tests:  Basename 07/14/11 1953  TSH 3.545  T4TOTAL --  T3FREE --  THYROIDAB --   Anemia Panel: No results found for this basename: VITAMINB12,FOLATE,FERRITIN,TIBC,IRON,RETICCTPCT in the last 72 hours  Imaging: No results found.  Cardiac Studies:  ECG:  Atrial fibrillation with PVC IVCD  Rate 88 no acute ischemic changes   Telemetry: Atrial fibrillation  Echo:  Mean gradient 37 peak 61 EF normal  Done at [facility] 8/12  Medications:      . antiseptic oral rinse  15 mL Mouth Rinse BID  . aspirin  81 mg Oral Daily  . cloNIDine  0.1 mg Oral Daily  . levalbuterol  1.25 mg Nebulization Q8H  . losartan  100 mg Oral Daily  . mulitivitamin with minerals  1 tablet Oral Daily  . omega-3 acid ethyl esters  1 g Oral Daily  . patient's guide to using coumadin book   Does not apply Once  . rivaroxaban    20 mg Oral Daily  . rosuvastatin  10 mg Oral q1800  . sodium chloride  3 mL Intravenous Q12H        . sodium chloride 10 mL/hr at 07/14/11 1900  . sodium chloride 10 mL/hr at 07/15/11 2014  . amiodarone (CORDARONE) infusion 30 mg/hr (07/17/11 0354)  . diltiazem (CARDIZEM) infusion 10 mg/hr (07/17/11 0354)    Assessment/Plan:  Afib:  Rate control is ok.  Discussed options Given AS and dyspnea feel facilitated [facility] sooner is in order.  Day3  Xarelto   Rate control is ok.  TEE/DCC today.  Risk of stroke and reason for TEE discussed.  He has had increased dyspnea for 2-3 months Was in NSR 11/12 when  He saw me in office AS:  We discussed options.  He indicates not wanting to pursue cath or TAVR at [facility] at this time.  I told him he would likely continue to be dyspnic but getting  Him out of afib should  help  Image AV and aortic root during TEE Diastolic dysfunction:  Lasix x1 related to AS/LVH   F/U BNP   Cholesterol:  Continue statin. COPD:  Will have pulmonary see today as I think this is a big issue with him.  Xopenex before TEE  Adelaide Pfefferkorn 07/17/2011, 9:01 AM        

## 2011-07-17 NOTE — Brief Op Note (Signed)
See report in [employer] Barry Thompson  

## 2011-07-17 NOTE — Anesthesia Postprocedure Evaluation (Signed)
  Anesthesia Post-op Note  Patient: Barry Thompson  Procedure(s) Performed:  TRANSESOPHAGEAL ECHOCARDIOGRAM (TEE) - With Cardioversion  Patient Location: PACU and Endoscopy Unit  Anesthesia Type: MAC  Level of Consciousness: awake  Airway and Oxygen Therapy: Patient Spontanous Breathing and Patient connected to nasal cannula oxygen  Post-op Pain: none  Post-op Assessment: Post-op Vital signs reviewed and Patient's Cardiovascular Status Stable  Post-op Vital Signs: Reviewed and stable  Complications: No apparent anesthesia complications

## 2011-07-17 NOTE — Interval H&P Note (Signed)
History and Physical Interval Note:  07/17/2011 12:10 PM  Barry Thompson  has presented today for surgery, with the diagnosis of AS and Afib  The various methods of treatment have been discussed with the patient and family. After consideration of risks, benefits and other options for treatment, the patient has consented to  Procedure(s): TRANSESOPHAGEAL ECHOCARDIOGRAM (TEE) as a surgical intervention .  The patients' history has been reviewed, patient examined, no change in status, stable for surgery.  I have reviewed the patients' chart and labs.  Questions were answered to the patient's satisfaction.     Olga Millers  No change Olga Millers

## 2011-07-17 NOTE — Preoperative (Signed)
Beta Blockers   Reason not to administer Beta Blockers:Not Applicable 

## 2011-07-17 NOTE — Anesthesia Preprocedure Evaluation (Signed)
Anesthesia Evaluation  Patient identified by MRN, date of birth, ID band Patient awake    Reviewed: Allergy & Precautions, H&P , NPO status , Patient's Chart, lab work & pertinent test results, reviewed documented beta blocker date and time   Airway Mallampati: III  Neck ROM: Full    Dental  (+) Dental Advisory Given   Pulmonary          Cardiovascular Exercise Tolerance: Poor hypertension, Pt. on medications + Orthopnea and + PND + dysrhythmias Atrial Fibrillation     Neuro/Psych    GI/Hepatic Neg liver ROS,   Endo/Other    Renal/GU      Musculoskeletal   Abdominal   Peds  Hematology   Anesthesia Other Findings   Reproductive/Obstetrics                           Anesthesia Physical Anesthesia Plan  ASA: III  Anesthesia Plan: MAC   Post-op Pain Management:    Induction:   Airway Management Planned: Mask  Additional Equipment:   Intra-op Plan:   Post-operative Plan:   Informed Consent:   Plan Discussed with: Anesthesiologist and Surgeon  Anesthesia Plan Comments:         Anesthesia Quick Evaluation

## 2011-07-17 NOTE — H&P (View-Only) (Signed)
Patient ID: Barry Thompson, male   DOB: February 20, 1924, 78 y.o.   MRN: [id number] @ Subjective:  Denies SSCP, palpitations  Dyspnea is persistant  He appears to be acting more like COPD exacerbation With relief using nebulizers   Objective:  Filed Vitals:   07/17/11 0400 07/17/11 0500 07/17/11 0600 07/17/11 0731  BP: 110/58     Pulse:      Temp: 97.3 F (36.3 C)   97.7 F (36.5 C)  TempSrc: Axillary   Oral  Resp: 26 21 20    Height:      Weight:  101.6 kg (223 lb 15.8 oz)    SpO2: 93%       Intake/Output from previous day:  Intake/Output Summary (Last 24 hours) at 07/17/11 0901 Last data filed at 07/17/11 0600  Gross per 24 hour  Intake 1240.7 ml  Output    525 ml  Net  715.7 ml    Physical Exam: General appearance: alert and no distress Neck: no adenopathy, no carotid bruit, no JVD, supple, symmetrical, trachea midline and thyroid not enlarged, symmetric, no tenderness/mass/nodules Lungs: expitory wheezing right base greater than left Heart: AS murmur Abdomen: soft, non-tender; bowel sounds normal; no masses,  no organomegaly Extremities: extremities normal, atraumatic, no cyanosis or edema Pulses: 2+ and symmetric Neurologic: Grossly normal  Lab Results: Basic Metabolic Panel:  Basename 07/17/11 0450 07/16/11 0552  NA 133* 131*  K 4.1 4.0  CL 93* 94*  CO2 29 26  GLUCOSE 121* 118*  BUN 46* 38*  CREATININE 1.63* 1.25  CALCIUM 9.8 9.8  MG -- --  PHOS -- --   Liver Function Tests:  Basename 07/14/11 1120  AST 21  ALT 15  ALKPHOS 47  BILITOT 1.0  PROT 7.5  ALBUMIN 3.9   No results found for this basename: LIPASE:2,AMYLASE:2 in the last 72 hours CBC:  Basename 07/14/11 1120  WBC 14.0*  NEUTROABS 11.5*  HGB 15.5  HCT 45.5  MCV 83.3  PLT 147*   Cardiac Enzymes:  Basename 07/15/11 0945 07/15/11 0103 07/14/11 1952  CKTOTAL 217 227 224  CKMB 3.5 3.8 4.3*  CKMBINDEX -- -- --  TROPONINI <0.30 <0.30 <0.30   BNP: No components found with this  basename: POCBNP:3 D-Dimer:  Basename 07/14/11 1120  DDIMER 0.46   Hemoglobin A1C: No results found for this basename: HGBA1C in the last 72 hours Fasting Lipid Panel:  Basename 07/15/11 0640  CHOL 131  HDL 51  LDLCALC 66  TRIG 69  CHOLHDL 2.6  LDLDIRECT --   Thyroid Function Tests:  Basename 07/14/11 1953  TSH 3.545  T4TOTAL --  T3FREE --  THYROIDAB --   Anemia Panel: No results found for this basename: VITAMINB12,FOLATE,FERRITIN,TIBC,IRON,RETICCTPCT in the last 72 hours  Imaging: No results found.  Cardiac Studies:  ECG:  Atrial fibrillation with PVC IVCD  Rate 88 no acute ischemic changes   Telemetry: Atrial fibrillation  Echo:  Mean gradient 37 peak 61 EF normal  Done at [facility] 8/12  Medications:      . antiseptic oral rinse  15 mL Mouth Rinse BID  . aspirin  81 mg Oral Daily  . cloNIDine  0.1 mg Oral Daily  . levalbuterol  1.25 mg Nebulization Q8H  . losartan  100 mg Oral Daily  . mulitivitamin with minerals  1 tablet Oral Daily  . omega-3 acid ethyl esters  1 g Oral Daily  . patient's guide to using coumadin book   Does not apply Once  . rivaroxaban  20 mg Oral Daily  . rosuvastatin  10 mg Oral q1800  . sodium chloride  3 mL Intravenous Q12H        . sodium chloride 10 mL/hr at 07/14/11 1900  . sodium chloride 10 mL/hr at 07/15/11 2014  . amiodarone (CORDARONE) infusion 30 mg/hr (07/17/11 0354)  . diltiazem (CARDIZEM) infusion 10 mg/hr (07/17/11 0354)    Assessment/Plan:  Afib:  Rate control is ok.  Discussed options Given AS and dyspnea feel facilitated [facility] sooner is in order.  Day3  Xarelto   Rate control is ok.  TEE/DCC today.  Risk of stroke and reason for TEE discussed.  He has had increased dyspnea for 2-3 months Was in NSR 11/12 when  He saw me in office AS:  We discussed options.  He indicates not wanting to pursue cath or TAVR at [facility] at this time.  I told him he would likely continue to be dyspnic but getting  Him out of afib should  help  Image AV and aortic root during TEE Diastolic dysfunction:  Lasix x1 related to AS/LVH   F/U BNP   Cholesterol:  Continue statin. COPD:  Will have pulmonary see today as I think this is a big issue with him.  Xopenex before TEE  [name] 07/17/2011, 9:01 AM

## 2011-07-17 NOTE — Progress Notes (Addendum)
Pt returned from [facility] from having a TEE minimal report given. Pt drowsy but easy to awake a/o x3 a little confused about which room he was sleeping. Pt denies any pain or discomfort

## 2011-07-17 NOTE — Progress Notes (Signed)
Pt awake alert/oriented x3 very short of breath at rest resp notified  MD notified breathing treatment ordered and medication changed

## 2011-07-17 NOTE — Procedures (Signed)
  Tee - Revealed spontaneous contrast in LA; no LAA thrombus.  Electrical Cardioversion Procedure Note Barry Thompson [id number] 1923/07/21  Procedure: Electrical Cardioversion Indications:  Atrial Fibrillation  Procedure Details Consent: Risks of procedure as well as the alternatives and risks of each were explained to the (patient/caregiver).  Consent for procedure obtained. Time Out: Verified patient identification, verified procedure, site/side was marked, verified correct patient position, special equipment/implants available, medications/allergies/relevent history reviewed, required imaging and test results available.  Performed  Patient placed on cardiac monitor, pulse oximetry, supplemental oxygen as necessary.  Sedation given: Versed 2 mg IV given for TEE; diprovan 30 mg IV given by anesthesia for DCCV. Pacer pads placed anterior and posterior chest.  Cardioverted 2 time(s).  Cardioverted at 120J without success; second attempt with 150 J resulted in sinus rhythm.  Evaluation Findings: Post procedure EKG shows: NSR Complications: None Patient did tolerate procedure well.   Barry Thompson 07/17/2011, 12:33 PM

## 2011-07-17 NOTE — Transfer of Care (Signed)
Immediate Anesthesia Transfer of Care Note  Patient: Barry Thompson  Procedure(s) Performed:  TRANSESOPHAGEAL ECHOCARDIOGRAM (TEE) - With Cardioversion  Patient Location: PACU and Endoscopy Unit  Anesthesia Type: MAC  Level of Consciousness: awake  Airway & Oxygen Therapy: Patient Spontanous Breathing and Patient connected to nasal cannula oxygen  Post-op Assessment: Report given to PACU RN  Post vital signs: Reviewed and stable  Complications: No apparent anesthesia complications

## 2011-07-18 ENCOUNTER — Encounter (HOSPITAL_COMMUNITY): Payer: PRIVATE HEALTH INSURANCE

## 2011-07-18 ENCOUNTER — Encounter (HOSPITAL_COMMUNITY): Payer: Self-pay | Admitting: Radiology

## 2011-07-18 ENCOUNTER — Other Ambulatory Visit (HOSPITAL_COMMUNITY): Payer: Self-pay | Admitting: Pulmonary Disease

## 2011-07-18 ENCOUNTER — Inpatient Hospital Stay (HOSPITAL_COMMUNITY): Payer: Medicare Other

## 2011-07-18 DIAGNOSIS — R0902 Hypoxemia: Secondary | ICD-10-CM

## 2011-07-18 DIAGNOSIS — I359 Nonrheumatic aortic valve disorder, unspecified: Secondary | ICD-10-CM

## 2011-07-18 DIAGNOSIS — R0602 Shortness of breath: Secondary | ICD-10-CM

## 2011-07-18 DIAGNOSIS — I4891 Unspecified atrial fibrillation: Secondary | ICD-10-CM

## 2011-07-18 DIAGNOSIS — J90 Pleural effusion, not elsewhere classified: Secondary | ICD-10-CM

## 2011-07-18 MED ORDER — AMIODARONE IV BOLUS ONLY 150 MG/100ML
150.0000 mg | Freq: Once | INTRAVENOUS | Status: AC
  Administered 2011-07-18: 150 mg via INTRAVENOUS
  Filled 2011-07-18: qty 100

## 2011-07-18 MED ORDER — RIVAROXABAN 10 MG PO TABS
10.0000 mg | ORAL_TABLET | Freq: Every day | ORAL | Status: DC
  Administered 2011-07-19 – 2011-07-20 (×2): 10 mg via ORAL
  Filled 2011-07-18 (×3): qty 1

## 2011-07-18 MED ORDER — IOHEXOL 350 MG/ML SOLN
100.0000 mL | Freq: Once | INTRAVENOUS | Status: AC | PRN
  Administered 2011-07-18: 100 mL via INTRAVENOUS

## 2011-07-18 MED ORDER — SODIUM CHLORIDE 0.9 % IV SOLN
INTRAVENOUS | Status: DC
  Administered 2011-07-18: 01:00:00 via INTRAVENOUS
  Administered 2011-07-23: 500 mL via INTRAVENOUS

## 2011-07-18 NOTE — Progress Notes (Signed)
Pt up to w/c to x ray for CT scan. Pt tolerated getting out of the chair into w/c with 1 person assist. Pt short of breath  o2 sat 94%pm 2 L n/c. Once in w/c pt slightly SOB recovery time 2-3 minutes. Pt tolerated Xray well.when returned from xray pt back to chair as requested he states that he rest better in the chair.

## 2011-07-18 NOTE — Progress Notes (Signed)
Notified MD that patient's heart rate is staying in the 50's. Cardizem running currently at 67ml/hr with amiodarone at 22ml/hr. Orders received to discontinue the cardizem gtt. Will continue to monitor. Natividad Brood

## 2011-07-18 NOTE — Consult Note (Signed)
Name: Barry Thompson MRN: [id number] DOB: [birth year]    LOS: 4 PCP:  Dr. Murray Hodgkins PCCM CONSULT NOTE    History of Present Illness:  78 y/o M with PMH of HTN, HLD, Aortic Stenosis- not candidate for surgery, and hx of prostadmitted on 12/31 with complaints of chest pain and shortness of breath.  Scheduled TEE 1/3 with cardioversion per Dr. Jens Som.  Post procedure, remained in SR but unfortunately went back into afib. He indicates he was noted to have a murmur by Dr. Chilton Si and was sent to Dr. Jens Som for evaluation in 05/2011.  Prior to admit, he was able to walk a block with his dog before resting.  Smoked for two years when in the [place] (age 72, 65) and then stopped.  Previous work history includes truck driving.  Currently, shortness of breath improved but remains dyspneic with activity.  Difficult to transfer from bed to chair.  Denies chills, weight loss, chest pain at present, cough, sputum production. PCCM consulted 1/4 for dyspnea.     Antibiotics: none   Tests / Events: 02/20/11 ECHO>>>EF >55%, nml systolic function, imparired LV relaxation RA/RV nml  1/3 TEE with cardioversion>>>SR briefly but then back to afib.  TEE limited study due to patient tolerance.    Past Medical History  Diagnosis Date  . Hypertension   . Hyperlipidemia   . Aortic stenosis   . 1st degree AV block     History reviewed. No pertinent past surgical history.  Prior to Admission medications   Medication Sig Start Date End Date Taking? Authorizing Provider  aspirin 81 MG tablet Take 81 mg by mouth daily.     Yes Historical Provider, MD  cloNIDine (CATAPRES) 0.1 MG tablet Take 0.1 mg by mouth 2 (two) times daily.     Yes Historical Provider, MD  Fish Oil OIL 1 tablet by Does not apply route daily.    Yes Historical Provider, MD  losartan-hydrochlorothiazide (HYZAAR) 100-12.5 MG per tablet Take 1 tablet by mouth daily.     Yes Historical Provider, MD  Multiple Vitamin (MULTIVITAMIN) capsule Take 1  capsule by mouth daily.     Yes Historical Provider, MD  simvastatin (ZOCOR) 40 MG tablet Take 40 mg by mouth at bedtime.     Yes Historical Provider, MD    Allergies No Known Allergies  Family History History reviewed. No pertinent family history.  Social History  reports that he has never smoked. He does not have any smokeless tobacco history on file. He reports that he does not drink alcohol or use illicit drugs.  Review Of Systems:  Gen: Denies fever, chills, weight change, fatigue, night sweats HEENT: Denies blurred vision, double vision, hearing loss, tinnitus, sinus congestion, rhinorrhea, sore throat, neck stiffness, dysphagia PULM: See HPI CV: Denies edema, orthopnea, paroxysmal nocturnal dyspnea, palpitations GI: Denies abdominal pain, nausea, vomiting, diarrhea, hematochezia, melena, constipation, change in bowel habits GU: Denies dysuria, hematuria, polyuria, oliguria, urethral discharge Endocrine: Denies hot or cold intolerance, polyuria, polyphagia or appetite change Derm: Denies rash, dry skin, scaling or peeling skin change Heme: Denies easy bruising, bleeding, bleeding gums Neuro: Denies headache, numbness, weakness, slurred speech, loss of memory or consciousness  Vital Signs: Temp:  [97.3 F (36.3 C)-98.5 F (36.9 C)] 98.5 F (36.9 C) (01/04 0748) Pulse Rate:  [62-78] 62  (01/04 0800) Resp:  [15-46] 22  (01/04 0800) BP: (108-189)/(51-96) 138/61 mmHg (01/04 0800) SpO2:  [85 %-100 %] 99 % (01/04 0828) Weight:  [225 lb 1.4 oz (  102.1 kg)] 225 lb 1.4 oz (102.1 kg) (01/04 0500) I/O last 3 completed shifts: In: 1107.8 [I.V.:1107.8] Out: 550 [Urine:550]  Physical Examination: General: wdwn elderly male in NAD Neuro: AAOx4, speech clear, MAE CV: s1s2 irregularly irregular PULM: resp's even / non-labored at rest, lungs bilaterally diminished with faint posterior crackles lower ZO:XWRUE, soft, bsx4 active, tol PO's Extremities: warm/dry, no edema    Labs      CBC    Component Value Date/Time   WBC 14.0* 07/14/2011 1120   RBC 5.46 07/14/2011 1120   HGB 15.5 07/14/2011 1120   HCT 45.5 07/14/2011 1120   PLT 147* 07/14/2011 1120   MCV 83.3 07/14/2011 1120   MCH 28.4 07/14/2011 1120   MCHC 34.1 07/14/2011 1120   RDW 13.7 07/14/2011 1120   LYMPHSABS 1.2 07/14/2011 1120   MONOABS 1.2* 07/14/2011 1120   EOSABS 0.0 07/14/2011 1120   BASOSABS 0.1 07/14/2011 1120        BMET    Component Value Date/Time   NA 133* 07/17/2011 0450   K 4.1 07/17/2011 0450   CL 93* 07/17/2011 0450   CO2 29 07/17/2011 0450   GLUCOSE 121* 07/17/2011 0450   BUN 46* 07/17/2011 0450   CREATININE 1.63* 07/17/2011 0450   CALCIUM 9.8 07/17/2011 0450   GFRNONAA 37* 07/17/2011 0450   GFRAA 43* 07/17/2011 0450    Radiology:  Assessment and Plan:  Dyspnea Assessment: Likely multifactorial w most significant contributor AS, new onset afib with variable rate up to 170's (now controlled).  No real history to support COPD (non-smoker, no occupational exposures etc, no changes on cxr c/w copd).  Chest pain and dyspnea on admit. Consider PE (unlikely on anticoag) or occult obstructive dz Plan: -CTA to r/o PE  -PFT's to assess lung function -Remove O2 at rest to assess saturations, suspect he will require home O2  Chest pain /Atrial fibrillation with rapid ventricular response Assessment: Plan: -rate control per cardiology/ xarelto  HTN (hypertension) / Mitral valve disease / Aortic valve disease / Mixed hyperlipidemia / First degree atrioventricular block Assessment: Plan: -per cardiology  Best practices / Disposition: -->Code Status:  -->DVT Px: -->GI Px: -->Diet:    Canary Brim, NP-C [facility] Pgr: [phone]  07/18/2011, 12:01 PM     Attending Addendum:  I have seen the patient, discussed the issues, test results and plans with B. Veleta Miners, NP. I agree with the Assessment and Plans as outlined above.   Levy Pupa, MD, PhD 07/18/2011, 1:08  PM [facility] [phone] or if no answer [phone]

## 2011-07-18 NOTE — Progress Notes (Addendum)
Cardiology Progress Note Patient Name: Barry Thompson Date of Encounter: 07/18/2011, 8:23 AM     Subjective  Patient cardioverted yesterday with return to NSR. The cardizem drip was dc'd last night due to heart rates in the 50. He converted back to A. Fib this morning around 5am with HRs 70s-80s. He remains dyspneic on 2-3L [place], but reports he thinks he breathing is somewhat improved with nebulizers. Denies chest pain or palpitations.   Objective   Telemetry: Atrial Fibrillation 70s-80s  EKG: Atrial Fibrillation 89bpm, LBBB  Medications: . aspirin  81 mg Oral Daily  . cloNIDine  0.1 mg Oral Daily  . ipratropium  0.5 mg Nebulization Once  . ipratropium  0.5 mg Nebulization Q4H  . levalbuterol  0.63 mg Nebulization Once  . levalbuterol  0.63 mg Nebulization Q4H  . losartan  100 mg Oral Daily  . mulitivitamin with minerals  1 tablet Oral Daily  . omega-3 acid ethyl esters  1 g Oral Daily  . patient's guide to using coumadin book   Does not apply Once  . rivaroxaban  20 mg Oral Daily  . rosuvastatin  10 mg Oral q1800  . sodium chloride  3 mL Intravenous Q12H   . amiodarone (CORDARONE) infusion 30 mg/hr (07/17/11 2205)  . DISCONTD: diltiazem (CARDIZEM) infusion 10 mg/hr (07/18/11 0030)   Physical Exam: Temp:  [97.3 F (36.3 C)-98.5 F (36.9 C)] 98.5 F (36.9 C) (01/04 0748) Pulse Rate:  [63-78] 78  (01/04 0600) Resp:  [15-46] 27  (01/04 0600) BP: (108-189)/(51-96) 157/52 mmHg (01/04 0400) SpO2:  [0 %-100 %] 97 % (01/04 0600) FiO2 (%):  [92 %] 92 % (01/03 1127) Weight:  [225 lb 1.4 oz (102.1 kg)] 225 lb 1.4 oz (102.1 kg) (01/04 0500)  General: Overweight white male, in no acute distress. Head: Normocephalic, atraumatic, sclera non-icteric, nares are without discharge.  Neck: Supple. Negative for carotid bruits or JVD Lungs: Expiratory wheezes throughout with bibasilar rales. No rhonchi. Breathing is unlabored, but he is dyspneic with conversation. Heart: Irregularly  irregular S1 S2, 3/6 systolic murmur at RUSB, without rubs or gallops.  Abdomen: Soft, non-tender, non-distended with normoactive bowel sounds. No rebound/guarding. No obvious abdominal masses. Msk:  Strength and tone appear normal for age. Extremities: Trace bilat LE edema. No clubbing or cyanosis. Distal pedal pulses are 2+ and equal bilaterally. Neuro: Alert and oriented X 3. Moves all extremities spontaneously. Psych:  Responds to questions appropriately with a normal affect.  Intake/Output Summary (Last 24 hours) at 07/18/11 0823 Last data filed at 07/18/11 0600  Gross per 24 hour  Intake  667.4 ml  Output    450 ml  Net  217.4 ml   Labs:  [facility] 07/17/11 0450 07/16/11 0552  NA 133* 131*  K 4.1 4.0  CL 93* 94*  CO2 29 26  GLUCOSE 121* 118*  BUN 46* 38*  CREATININE 1.63* 1.25  CALCIUM 9.8 9.8    07/14/2011 19:52 07/15/2011 01:03 07/15/2011 09:45  CK, MB 4.3 (H) 3.8 3.5  CK Total 224 227 217  Troponin I <0.30 <0.30 <0.30     07/15/2011 06:40  Cholesterol 131  Triglycerides 69  HDL 51  LDL (calc) 66  VLDL 14  Total CHOL/ HDL Ratio 2.6    Radiology/Studies:   07/17/11 -TEE DCCV - Limited study to R/O LAA thrombus prior to DCCV (study terminated early due to poor patient tolerance). Spontaneous contrast noted in left atrium and LAA but no thrombus;  limited images reveal small pericardial effusion; suggest full transthoracic echo to better assess AS, pericardial effusion and LV function. - Cardioverted 2 time(s).  Cardioverted at 120J without success; second attempt with 150 J resulted in sinus rhythm.  Dg Chest Port 1 View 07/14/2011  Findings: Suboptimal inspiration accounts for crowded bronchovascular markings, especially in the lung bases, and accentuates the cardiac silhouette.  This accounts for atelectasis in the lung bases, right greater than left.  Cardiac silhouette mildly to moderately enlarged.  Thoracic aorta atherosclerotic, unchanged.  Hilar and mediastinal  contours otherwise unremarkable. Lungs otherwise clear.  No visible pleural effusions.  IMPRESSION: Suboptimal inspiration accounts for bibasilar atelectasis, right greater than left.  Cardiomegaly without pulmonary edema.      Assessment and Plan  87yom w/ PMHx significant for HTN, Aortic stenosis, HLD, and 1st degree AV block who presented to [facility] on [facility]11 with complaints of shortness of breath and chest pain who was found to be in atrial fibrillation w/ RVR with Acute Diastolic CHF.   1. Atrial Fibrillation: He was cardioverted yesterday with return to NSR but converted back to atrial fibrillation this morning. He is rate controlled with heart rates in the 70s-80s. Continue Xarelto, but decrease to 10mg  daily given increased creatinine. Give 150mg  Amiodarone bolus and continue Amiodarone drip.  2. Aortic Stenosis: He is not a good candidate for AVR at this time and is not sure he wants to pursue that route.   3. Diastolic CHF: His volume status is stable at this time. Suspect his acute dyspnea is more of a pulmonary issue. Continue to monitor for need for diuresis. Cont ARB  4. Dyspnea: He remains dyspneic on 2-3L [place] with wheezes throughout. Cont nebulizers and await Pulmonary recommendations.  5. HLD: LDL 66. Continue statin  - Will order PT/OT consultation for disposition recommendations.  Signed, Lousie Calico PA-C  Continue amiodarone iv although hopes of sustained sinus are low.  Pulmonary to see today as I believe The largest component of dsypnea is pulmonary.  Not a candidate for open AVR.  Patient and son not Interested at this time in actively pursing TAVR at [facility].  Continue Marcello Moores 9:27 AM 07/18/2011

## 2011-07-18 NOTE — Progress Notes (Signed)
Clinical Social Worker called PA to put in PT/OT consult for disposition.   Rozetta Nunnery MSW, [employer] [phone]

## 2011-07-18 NOTE — Clinical Documentation Improvement (Signed)
RENAL FUNCTION DOCUMENTATION CLARIFICATION QUERY  THIS DOCUMENT IS NOT A PERMANENT PART OF THE MEDICAL RECORD  Please update your documentation within the medical record to reflect your response to this query.                                                                                     07/18/11  Dr. Eden Emms,  In a better effort to capture your patient's severity of illness, reflect appropriate length of stay and utilization of resources, a review of the patient medical record has revealed the following indicators.    Based on your clinical judgment, please document in the progress notes and discharge summary if a condition below provides greater specificity regarding the patient's renal status this admission:  - Acute Renal Failure  - Acute Kidney Injury  - Other Condition (please specify)  - Unable to Clinically Determine   Clinical Information:   BUN/CR/GFR (wm) 17/0.92/75 (admission)  46/1.63/37 (07/17/11)  Diuresed for acute diastolic heart failure    In responding to this query please exercise your independent judgment.  The fact that a query is asked, does not imply that any particular answer is desired or expected.   Reviewed:  no additional documentation provided  Thank You,  Jerral Ralph  Clinical Documentation Specialist:  Pager  Health Information Management [place]  TO RESPOND TO THE THIS QUERY, FOLLOW THE INSTRUCTIONS BELOW:  1. If needed, update documentation for the patient's encounter via the notes activity.  2. Access this query again and click edit on the In [employer].  3. After updating, or not, click F2 to complete all highlighted (required) fields concerning your review. Select "additional documentation in the medical record" OR "no additional documentation provided".  4. Click Sign note button.  5. The deficiency will fall out of your In Basket *Please let us know if you are not able to complete this workflow by phone or  e-mail (listed below).  Patient has heart failure from aortic stenosis.  Diuresed with prerenal azotemia no renal "failure" Peter Nishan 4:00 PM 07/18/2011

## 2011-07-19 ENCOUNTER — Inpatient Hospital Stay (HOSPITAL_COMMUNITY): Payer: Medicare Other

## 2011-07-19 DIAGNOSIS — R0989 Other specified symptoms and signs involving the circulatory and respiratory systems: Secondary | ICD-10-CM

## 2011-07-19 DIAGNOSIS — I313 Pericardial effusion (noninflammatory): Secondary | ICD-10-CM

## 2011-07-19 DIAGNOSIS — N289 Disorder of kidney and ureter, unspecified: Secondary | ICD-10-CM

## 2011-07-19 DIAGNOSIS — I509 Heart failure, unspecified: Secondary | ICD-10-CM

## 2011-07-19 DIAGNOSIS — J90 Pleural effusion, not elsewhere classified: Secondary | ICD-10-CM

## 2011-07-19 DIAGNOSIS — D689 Coagulation defect, unspecified: Secondary | ICD-10-CM

## 2011-07-19 DIAGNOSIS — R0902 Hypoxemia: Secondary | ICD-10-CM

## 2011-07-19 LAB — BASIC METABOLIC PANEL
BUN: 45 mg/dL — ABNORMAL HIGH (ref 6–23)
Chloride: 96 mEq/L (ref 96–112)
Creatinine, Ser: 1.4 mg/dL — ABNORMAL HIGH (ref 0.50–1.35)
GFR calc Af Amer: 51 mL/min — ABNORMAL LOW (ref >90)

## 2011-07-19 LAB — CHOLESTEROL, TOTAL: Cholesterol: 101 mg/dL (ref 0–200)

## 2011-07-19 LAB — PROTEIN, TOTAL: Total Protein: 6.3 g/dL (ref 6.0–8.3)

## 2011-07-19 LAB — LACTATE DEHYDROGENASE: LDH: 204 U/L (ref 94–250)

## 2011-07-19 NOTE — Progress Notes (Signed)
Physical Therapy Evaluation Patient Details Name: Barry Thompson MRN: [id number] DOB: 1923-11-17 Today's Date: 07/19/2011  Problem List:  Patient Active Problem List  Diagnoses  . HTN (hypertension)  . Mitral valve disease  . Aortic valve disease  . Mixed hyperlipidemia  . First degree atrioventricular block  . Dyspnea  . Chest pain  . Atrial fibrillation with rapid ventricular response    Past Medical History:  Past Medical History  Diagnosis Date  . Hypertension   . Hyperlipidemia   . Aortic stenosis   . 1st degree AV block    Past Surgical History: History reviewed. No pertinent past surgical history.  PT Assessment/Plan/Recommendation PT Assessment Clinical Impression Statement: Pt is an 78 y.o. male presenting with pulmonary and cardiac issues.  Deconditioning/weakness has devleoped as a result of medical condition and extended hospital stay.  PT intervention indicated to maximize mobility and strength to speed recovery and progress to independent level. PT Recommendation/Assessment: Patient will need skilled PT in the acute care venue PT Problem List: Decreased strength;Decreased activity tolerance;Decreased mobility;Cardiopulmonary status limiting activity Barriers to Discharge: None PT Therapy Diagnosis : Difficulty walking;Generalized weakness PT Plan PT Frequency: Min 3X/week PT Treatment/Interventions: Gait training;DME instruction;Functional mobility training;Therapeutic activities;Therapeutic exercise;Balance training;Patient/family education PT Recommendation Follow Up Recommendations: Skilled nursing facility Equipment Recommended: Defer to next venue PT Goals  Acute Rehab PT Goals PT Goal Formulation: With patient Time For Goal Achievement: 2 weeks Pt will go Supine/Side to Sit: with modified independence Pt will go Sit to Supine/Side: with modified independence Pt will go Sit to Stand: with supervision Pt will go Stand to Sit: with supervision Pt will  Transfer Bed to Chair/Chair to Bed: with supervision Pt will Ambulate: 51 - 150 feet;with min assist;with least restrictive assistive device  PT Evaluation Precautions/Restrictions    Prior Functioning  Home Living Lives With: Spouse Receives Help From: Family Type of Home: House Home Layout: Two level;Full bath on main level;Able to live on main level with bedroom/bathroom Home Access: Stairs to enter Entrance Stairs-Number of Steps: 1   Cognition Cognition Arousal/Alertness: Awake/alert Overall Cognitive Status: Appears within functional limits for tasks assessed Orientation Level: Oriented X4 Sensation/Coordination   Extremity Assessment   Mobility (including Balance) Bed Mobility Bed Mobility: Yes Supine to Sit: 4: Min assist;HOB elevated (Comment degrees) (30 degrees) Supine to Sit Details (indicate cue type and reason): verbal cues for sequencing Transfers Transfers: Yes Sit to Stand: 4: Min assist;With upper extremity assist;From bed Sit to Stand Details (indicate cue type and reason): verbal cues for hand placement Stand to Sit: 4: Min assist;To chair/3-in-1;With armrests Stand to Sit Details: verbal cues for safety/sequencing Ambulation/Gait Ambulation/Gait: Yes Ambulation/Gait Assistance: 4: Min assist Ambulation/Gait Assistance Details (indicate cue type and reason): dypsnea noted during gait. O2 via [address] in place throughout Rx. Ambulation Distance (Feet): 25 Feet Assistive device: 1 person hand held assist Gait Pattern: Within Functional Limits Gait velocity: decreased cadence    Exercise    End of Session PT - End of Session Equipment Utilized During Treatment: Gait belt Activity Tolerance: Patient limited by fatigue Patient left: in chair;with call bell in reach;with family/visitor present Nurse Communication: Mobility status for transfers General Behavior During Session: [facility] for tasks performed Cognition: [facility] for tasks performed  Ilda Foil 07/19/2011, 10:32 AM  Aida Raider, PT  Office # [phone] Pager [phone]

## 2011-07-19 NOTE — Progress Notes (Signed)
Name: Barry Thompson MRN: [id number] DOB: [birth year]    LOS: 5 PCP:  Dr. Murray Hodgkins PCCM Progress Note :     History of Present Illness:  78 y/o M with PMH of HTN, HLD, Aortic Stenosis- not candidate for surgery, and hx of prostadmitted on 12/31 with complaints of chest pain and shortness of breath.  Scheduled TEE 1/3 with cardioversion per Dr. Jens Som.  Post procedure, remained in SR but unfortunately went back into afib. He indicates he was noted to have a murmur by Dr. Chilton Si and was sent to Dr. Jens Som for evaluation in 05/2011.  Prior to admit, he was able to walk a block with his dog before resting.  Smoked for two years when in the [place] (age 51, 50) and then stopped.  Previous work history includes truck driving.  Currently, shortness of breath improved but remains dyspneic with activity.  Difficult to transfer from bed to chair.  Denies chills, weight loss, chest pain at present, cough, sputum production. PCCM consulted 1/4 for dyspnea.     Antibiotics: none   Tests / Events: 02/20/11 ECHO>>>EF >55%, nml systolic function, imparired LV relaxation RA/RV nml  1/3 TEE with cardioversion>>>SR briefly but then back to afib.  TEE limited study due to patient tolerance.   1/4 CT chest > No evidence of pulmonary emboli or thoracic aortic aneurysm. Moderate pericardial effusion with bilateral pleural effusions, small on the right and small to moderate on the left.     Vital Signs: Temp:  [97.7 F (36.5 C)-99 F (37.2 C)] 98.1 F (36.7 C) (01/05 1213) Pulse Rate:  [63-96] 82  (01/05 0400) Resp:  [21-28] 27  (01/05 0400) BP: (148-172)/(73-97) 148/88 mmHg (01/05 1213) SpO2:  [92 %-99 %] 96 % (01/05 1214) I/O last 3 completed shifts: In: 984.5 [I.V.:984.5] Out: 1225 [Urine:1225]  Physical Examination: General: wdwn elderly male in NAD Neuro: AAOx4, speech clear, MAE CV: s1s2 irregularly irregular, gr2/6 SM  PULM: resp's even / non-labored at rest, lungs bilaterally diminished  with faint posterior crackles lower WU:JWJXB, soft, bsx4 active, tol PO's Extremities: warm/dry, no edema    Labs     CBC    Component Value Date/Time   WBC 14.0* 07/14/2011 1120   RBC 5.46 07/14/2011 1120   HGB 15.5 07/14/2011 1120   HCT 45.5 07/14/2011 1120   PLT 147* 07/14/2011 1120   MCV 83.3 07/14/2011 1120   MCH 28.4 07/14/2011 1120   MCHC 34.1 07/14/2011 1120   RDW 13.7 07/14/2011 1120   LYMPHSABS 1.2 07/14/2011 1120   MONOABS 1.2* 07/14/2011 1120   EOSABS 0.0 07/14/2011 1120   BASOSABS 0.1 07/14/2011 1120        BMET    Component Value Date/Time   NA 134* 07/19/2011 1134   K 4.1 07/19/2011 1134   CL 96 07/19/2011 1134   CO2 29 07/19/2011 1134   GLUCOSE 130* 07/19/2011 1134   BUN 45* 07/19/2011 1134   CREATININE 1.40* 07/19/2011 1134   CALCIUM 9.5 07/19/2011 1134   GFRNONAA 44* 07/19/2011 1134   GFRAA 51* 07/19/2011 1134    Radiology:  Assessment and Plan:  Dyspnea Assessment: Likely multifactorial w most significant contributor AS, new onset afib with variable rate up to 170's (now controlled).  No real history to support COPD (non-smoker, no occupational exposures etc, no changes on cxr c/w copd).   1/5 CT chest neg for PE, , mod pericardial effusion w/ bilateral left > right pleural effusions  cxr with increased effusion on left  Plan: -PFT's to assess lung function  -review CT to eval if large enough for thoracentesis >reveiwed CT chest with DR. Simonds-order IR guided Throacentesis of left pleural effusion with drainage and evaluation of fluid    Chest pain /Atrial fibrillation with rapid ventricular response Assessment: Plan: -rate control per cardiology/ xarelto  HTN (hypertension) / Mitral valve disease / Aortic valve disease / Mixed hyperlipidemia / First degree atrioventricular block Assessment: Plan: -per cardiology  Best practices / Disposition: -->Code Status: full  -->DVT Px: xarelto  -->GI Px: -->Diet:    PARRETT,TAMMY NP  [place]  [facility]   07/19/2011, 2:15 PM     Pt seen and examined and database reviewed. I agree with above findings, assessment and plan  Billy Fischer, MD;  PCCM service; Mobile [phone]

## 2011-07-19 NOTE — Progress Notes (Signed)
  Patient Name: Barry Thompson      SUBJECTIVE: without confusion with mild shortness of breath  this is somewhat better than yesterday.  Past Medical History  Diagnosis Date  . Hypertension   . Hyperlipidemia   . Aortic stenosis   . 1st degree AV block     PHYSICAL EXAM Filed Vitals:   07/19/11 0400 07/19/11 0740 07/19/11 0828 07/19/11 0856  BP:    152/79  Pulse: 82     Temp: 98.1 F (36.7 C)  97.7 F (36.5 C)   TempSrc: Oral  Oral   Resp: 27     Height:      Weight:      SpO2: 99% 97%      Well developed and nourished in mild respiratory distress HENT normal Neck supple with JVP unable to discern  Carotids delayed but full  Clear occasional rales Irregularly irregular rate and rhythm with controlled  ventricular response, 2-3/6 systolic murmur to the right upper sternal border; S2 single Abd-soft with active BS without hepatomegaly No Clubbing cyanosis edema Skin-warm and dry A & Oriented  Grossly normal sensory and motor function   TELEMETRY: Reviewed telemetry pt in atrial fibrillation   Intake/Output Summary (Last 24 hours) at 07/19/11 1047 Last data filed at 07/19/11 0700  Gross per 24 hour  Intake  560.7 ml  Output    850 ml  Net -289.3 ml    LABS: Basic Metabolic Panel:  Lab 07/17/11 1610 07/16/11 0552 07/15/11 0640 07/14/11 1120  NA 133* 131* 134* 139  K 4.1 4.0 4.5 4.1  CL 93* 94* 99 100  CO2 29 26 25 30   GLUCOSE 121* 118* 131* 107*  BUN 46* 38* 30* 17  CREATININE 1.63* 1.25 1.33 0.92  CALCIUM 9.8 9.8 -- --  MG -- -- -- --  PHOS -- -- -- --   Cardiac Enzymes: No results found for this basename: CKTOTAL:3,CKMB:3,CKMBINDEX:3,TROPONINI:3 in the last 72 hours CBC:  Lab 07/14/11 1120  WBC 14.0*  NEUTROABS 11.5*  HGB 15.5  HCT 45.5  MCV 83.3  PLT 147*    ASSESSMENT AND PLAN:  The patient presented to the hospital with atrial fibrillation in the setting of aortic stenosis and congestive heart failure. He failed TE guided  cardioversion. He remains on Rivaroxaban  And amiodarone was initiated for both rate control and possibly for maintenance of sinus rhythm. He is felt to have some degree of COPD and was seen by pulmonary. CT angios morning demonstrated no pulmonary emboli. Unfortunately also showed pleural effusions and pericardial effusion. We will need an echo to assess this. Hospital course further notable for an elevated white count rate increasing creatinine.  There've been discussions in the past regarding valve replacement and a decision I guess has been not to pursue that   Patient Active Hospital Problem List: Dyspnea (07/14/2011) although multifactorial including some component of congestive heart failure: Will check renal function and anticipate diuresis   HTN (hypertension) (05/22/2011)  Aortic valve stensois   Assessment: needs 2d echo   Plan:    Atrial fibrillation with rapid ventricular response (07/14/2011)   Assessment:  Still an issue   I agree with use of ongoing amiodarone and anticipated cardioversion may be next week. He will tolerate atrial fibrillation poorly.   Pericardial effusion: 2-D echo  Renal insufficiency

## 2011-07-20 DIAGNOSIS — I319 Disease of pericardium, unspecified: Secondary | ICD-10-CM

## 2011-07-20 DIAGNOSIS — R791 Abnormal coagulation profile: Secondary | ICD-10-CM

## 2011-07-20 DIAGNOSIS — J90 Pleural effusion, not elsewhere classified: Secondary | ICD-10-CM

## 2011-07-20 LAB — BASIC METABOLIC PANEL
CO2: 30 mEq/L (ref 19–32)
Calcium: 9.7 mg/dL (ref 8.4–10.5)
Chloride: 98 mEq/L (ref 96–112)
Creatinine, Ser: 1.29 mg/dL (ref 0.50–1.35)
Glucose, Bld: 117 mg/dL — ABNORMAL HIGH (ref 70–99)

## 2011-07-20 MED ORDER — CLONIDINE HCL 0.1 MG PO TABS
0.1000 mg | ORAL_TABLET | Freq: Two times a day (BID) | ORAL | Status: DC
  Administered 2011-07-20 – 2011-07-24 (×8): 0.1 mg via ORAL
  Filled 2011-07-20 (×10): qty 1

## 2011-07-20 MED ORDER — CLONIDINE HCL 0.2 MG PO TABS
0.2000 mg | ORAL_TABLET | Freq: Once | ORAL | Status: AC
  Administered 2011-07-20: 0.2 mg via ORAL
  Filled 2011-07-20: qty 1

## 2011-07-20 MED ORDER — AMIODARONE HCL 200 MG PO TABS
400.0000 mg | ORAL_TABLET | Freq: Two times a day (BID) | ORAL | Status: DC
  Administered 2011-07-20 – 2011-07-23 (×7): 400 mg via ORAL
  Filled 2011-07-20 (×8): qty 2

## 2011-07-20 NOTE — Progress Notes (Signed)
Name: Barry Thompson MRN: [id number] DOB: 16-Feb-1924    LOS: 6 PCP:  Dr. Murray Hodgkins PCCM Progress Note :     History of Present Illness:  78 y/o M with PMH of HTN, HLD, Aortic Stenosis- not candidate for surgery, and hx of prostadmitted on 12/31 with complaints of chest pain and shortness of breath.  Scheduled TEE 1/3 with cardioversion per Dr. Jens Som.  Post procedure, remained in SR but unfortunately went back into afib. He indicates he was noted to have a murmur by Dr. Chilton Si and was sent to Dr. Jens Som for evaluation in 05/2011.  Prior to admit, he was able to walk a block with his dog before resting.  Smoked for two years when in the [place] (age 53, 25) and then stopped.  Previous work history includes truck driving.  Currently, shortness of breath improved but remains dyspneic with activity.  Difficult to transfer from bed to chair.  Denies chills, weight loss, chest pain at present, cough, sputum production. PCCM consulted 1/4 for dyspnea.     Antibiotics: none   Tests / Events: 02/20/11 ECHO>>>EF >55%, nml systolic function, imparired LV relaxation RA/RV nml  1/3 TEE with cardioversion>>>SR briefly but then back to afib.  TEE limited study due to patient tolerance.   1/4 CT chest > No evidence of pulmonary emboli or thoracic aortic aneurysm. Moderate pericardial effusion with bilateral pleural effusions, small on the right and small to moderate on the left.   Overnight:  Throacentesis on hold due to planned cardioversion -would need to stop anticoaguation. Discussion per DR. Graciela Husbands and CCM   Vital Signs: Temp:  [97.4 F (36.3 C)-99 F (37.2 C)] 97.6 F (36.4 C) (01/06 1141) Pulse Rate:  [44-92] 84  (01/06 1000) Resp:  [17-28] 19  (01/06 1000) BP: (140-168)/(54-86) 157/86 mmHg (01/06 0700) SpO2:  [90 %-100 %] 96 % (01/06 1000) Weight:  [101.6 kg (223 lb 15.8 oz)] 223 lb 15.8 oz (101.6 kg) (01/06 0600) I/O last 3 completed shifts: In: 1951.2 [P.O.:1000; I.V.:951.2] Out: 1800  [Urine:1800]  Physical Examination: General: wdwn elderly male in NAD Neuro: AAOx4, speech clear, MAE CV: s1s2 irregularly irregular, gr2/6 SM  PULM: resp's even / non-labored at rest, lungs bilaterally diminished with faint posterior crackles lower WU:JWJXB, soft, bsx4 active, tol PO's Extremities: warm/dry, no edema    Labs     CBC    Component Value Date/Time   WBC 14.0* 07/14/2011 1120   RBC 5.46 07/14/2011 1120   HGB 15.5 07/14/2011 1120   HCT 45.5 07/14/2011 1120   PLT 147* 07/14/2011 1120   MCV 83.3 07/14/2011 1120   MCH 28.4 07/14/2011 1120   MCHC 34.1 07/14/2011 1120   RDW 13.7 07/14/2011 1120   LYMPHSABS 1.2 07/14/2011 1120   MONOABS 1.2* 07/14/2011 1120   EOSABS 0.0 07/14/2011 1120   BASOSABS 0.1 07/14/2011 1120        BMET    Component Value Date/Time   NA 137 07/20/2011 0615   K 4.1 07/20/2011 0615   CL 98 07/20/2011 0615   CO2 30 07/20/2011 0615   GLUCOSE 117* 07/20/2011 0615   BUN 38* 07/20/2011 0615   CREATININE 1.29 07/20/2011 0615   CALCIUM 9.7 07/20/2011 0615   GFRNONAA 49* 07/20/2011 0615   GFRAA 57* 07/20/2011 0615    Radiology:  Assessment and Plan:  Dyspnea Assessment: Likely multifactorial w most significant contributor AS, new onset afib with variable rate up to 170's (now controlled).  No real history to support COPD (  non-smoker, no occupational exposures etc, no changes on cxr c/w copd).   1/5 CT chest neg for PE, , mod pericardial effusion w/ bilateral left > right pleural effusions  cxr with increased effusion on left   Plan: -PFT's to assess lung function  -hold on throacentsis for now -discuss futher with card tomorrow.   Chest pain /Atrial fibrillation with rapid ventricular response Assessment: Plan: -rate control per cardiology/ xarelto  HTN (hypertension) / Mitral valve disease / Aortic valve disease / Mixed hyperlipidemia / First degree atrioventricular block Assessment: Plan: -per cardiology  Best practices /  Disposition: -->Code Status: full  -->DVT Px: xarelto  -->GI Px: -->Diet:    PARRETT,TAMMY NP  [facility] [facility]   07/20/2011, 12:15 PM    Patient seen and examined and database was reviewed with ACNP Parrett. The above note reflects the plan as established on morning rounds.  Billy Fischer, MD;  PCCM service; Mobile [phone]

## 2011-07-20 NOTE — Progress Notes (Signed)
  Echocardiogram 2D Echocardiogram has been performed.  Barry Thompson Deneen 07/20/2011, 1:43 PM

## 2011-07-20 NOTE — Progress Notes (Signed)
Patient Name: Barry Thompson      SUBJECTIVE: without confusion with mild shortness of breath  this is somewhat better than yesterday. Likely related to improved rate control  Past Medical History  Diagnosis Date  . Hypertension   . Hyperlipidemia   . Aortic stenosis   . 1st degree AV block     PHYSICAL EXAM Filed Vitals:   07/20/11 0826 07/20/11 0832 07/20/11 0900 07/20/11 1000  BP:      Pulse:   82 84  Temp: 98.4 F (36.9 C)     TempSrc: Oral     Resp:   28 19  Height:      Weight:      SpO2:  94% 93% 96%   BP 157/86  Pulse 84  Temp(Src) 98.4 F (36.9 C) (Oral)  Resp 19  Ht 6\' 1"  (1.854 m)  Wt 223 lb 15.8 oz (101.6 kg)  BMI 29.55 kg/m2  SpO2 96%   Temp (24hrs), Avg:98.2 F (36.8 C), Min:97.4 F (36.3 C), Max:99 F (37.2 C)   Well developed and nourished in mild respiratory distress HENT normal Neck supple with JVP unable to discern  Carotids delayed but full  Clear occasional rales Irregularly irregular rate and rhythm with controlled  ventricular response, 2-3/6 systolic murmur to the right upper sternal border; S2 single Abd-soft with active BS without hepatomegaly No Clubbing cyanosis edema Skin-warm and dry A & Oriented  Grossly normal sensory and motor function   TELEMETRY: Reviewed telemetry pt in atrial fibrillation   Intake/Output Summary (Last 24 hours) at 07/20/11 1104 Last data filed at 07/20/11 1000  Gross per 24 hour  Intake 1354.1 ml  Output   1050 ml  Net  304.1 ml    LABS: Basic Metabolic Panel:  Lab 07/20/11 8295 07/19/11 1134 07/17/11 0450 07/16/11 0552 07/15/11 0640 07/14/11 1120  NA 137 134* 133* 131* 134* 139  K 4.1 4.1 4.1 4.0 4.5 4.1  CL 98 96 93* 94* 99 100  CO2 30 29 29 26 25 30   GLUCOSE 117* 130* 121* 118* 131* 107*  BUN 38* 45* 46* 38* 30* 17  CREATININE 1.29 1.40* 1.63* 1.25 1.33 0.92  CALCIUM 9.7 9.5 -- -- -- --  MG -- -- -- -- -- --  PHOS -- -- -- -- -- --   Cardiac Enzymes: No results found for this  basename: CKTOTAL:3,CKMB:3,CKMBINDEX:3,TROPONINI:3 in the last 72 hours CBC:  Lab 07/14/11 1120  WBC 14.0*  NEUTROABS 11.5*  HGB 15.5  HCT 45.5  MCV 83.3  PLT 147*    ASSESSMENT AND PLAN:  The patient presented to the hospital with atrial fibrillation in the setting of aortic stenosis and congestive heart failure. He failed TE guided cardioversion. He remains on Rivaroxaban (GFR-55) And amiodarone was initiated for both rate control and possibly for maintenance of sinus rhythm. He is felt to have some degree of COPD and was seen by pulmonary. CT angios morning demonstrated no pulmonary emboli. Unfortunately also showed pleural effusions and pericardial effusion. We will need an echo to assess this. Echo is pending  Hospital course further notable for an elevated white count and  increasing creatinine which is now improving  There've been discussions in the past regarding valve replacement and a decision I guess has been made,  not to pursue that   The patient also has a pleural effusion. CCM has suggested thoracentesis under ultrasound guidance. I with Dr. Sung Amabile. We have to prioritize thoracentesis versus cardioversion as before will  require (almost certainly) discontinuation of his  Rivaroxaban and thus would necessitate probably a TEE again prior to repeat cardioversion. Alternatively effusions could be a consequence of his heart failure derived from his atrial fibrillation in the setting of aortic stenosis and restoration of sinus rhythm could be key to the rtreatment.  There is a raised as to whether there may be a pleural pericarditis; he lacks symptoms to support this. I will check a sedimentation rate. If this were the case this might also explain the onset of atrial fibrillation although his diastolic heart disease and age are sufficient   At this point Dr. Sung Amabile has suggested that in the event that thoracentesis would require discontinuation of anticoagulation that he would be in  favor of continuing anticoagulation and undertaking cardioversion. We will await the decision tomorrow from CCM as to whether the thoracentesis can be d on anticoagulation  I will convert his amiodarone to by mouth. I would anticipate cardioversion Tuesday or Wednesday         Patient Active Hospital Problem List: Dyspnea (07/14/2011)  * Aortic stenosis, severe (05/22/2011)   Atrial fibrillation with rapid ventricular response (07/14/2011)   Pericardial effusion (07/19/2011)   Pleural effusion (07/20/2011)

## 2011-07-21 ENCOUNTER — Inpatient Hospital Stay (HOSPITAL_COMMUNITY): Payer: Medicare Other

## 2011-07-21 ENCOUNTER — Encounter (HOSPITAL_COMMUNITY): Payer: Self-pay | Admitting: Cardiology

## 2011-07-21 LAB — BODY FLUID CELL COUNT WITH DIFFERENTIAL
Lymphs, Fluid: 30 %
Monocyte-Macrophage-Serous Fluid: 55 % (ref 50–90)
Monocyte-Macrophage-Serous Fluid: 60 % (ref 50–90)
Neutrophil Count, Fluid: 15 % (ref 0–25)
Total Nucleated Cell Count, Fluid: 139 cu mm (ref 0–1000)
Total Nucleated Cell Count, Fluid: 151 cu mm (ref 0–1000)

## 2011-07-21 LAB — CBC
MCH: 28.7 pg (ref 26.0–34.0)
MCHC: 33.5 g/dL (ref 30.0–36.0)
MCV: 85.7 fL (ref 78.0–100.0)
Platelets: 254 10*3/uL (ref 150–400)
RDW: 13.8 % (ref 11.5–15.5)

## 2011-07-21 LAB — BASIC METABOLIC PANEL
BUN: 30 mg/dL — ABNORMAL HIGH (ref 6–23)
Calcium: 9.7 mg/dL (ref 8.4–10.5)
Creatinine, Ser: 1.21 mg/dL (ref 0.50–1.35)
GFR calc Af Amer: 61 mL/min — ABNORMAL LOW (ref >90)
GFR calc non Af Amer: 53 mL/min — ABNORMAL LOW (ref >90)

## 2011-07-21 LAB — PROTEIN, BODY FLUID

## 2011-07-21 LAB — BODY FLUID CULTURE

## 2011-07-21 MED ORDER — POTASSIUM CHLORIDE 20 MEQ/15ML (10%) PO LIQD
40.0000 meq | Freq: Once | ORAL | Status: DC
  Filled 2011-07-21: qty 30

## 2011-07-21 MED ORDER — RIVAROXABAN 15 MG PO TABS
15.0000 mg | ORAL_TABLET | Freq: Every day | ORAL | Status: DC
  Administered 2011-07-21 – 2011-07-24 (×4): 15 mg via ORAL
  Filled 2011-07-21 (×4): qty 1

## 2011-07-21 MED ORDER — POTASSIUM CHLORIDE CRYS ER 20 MEQ PO TBCR
40.0000 meq | EXTENDED_RELEASE_TABLET | Freq: Once | ORAL | Status: AC
  Administered 2011-07-21: 40 meq via ORAL
  Filled 2011-07-21: qty 2

## 2011-07-21 MED ORDER — FUROSEMIDE 10 MG/ML IJ SOLN
40.0000 mg | Freq: Two times a day (BID) | INTRAMUSCULAR | Status: DC
  Administered 2011-07-21 – 2011-07-24 (×7): 40 mg via INTRAVENOUS
  Filled 2011-07-21 (×9): qty 4

## 2011-07-21 MED ORDER — ALBUTEROL SULFATE (5 MG/ML) 0.5% IN NEBU
2.5000 mg | INHALATION_SOLUTION | Freq: Once | RESPIRATORY_TRACT | Status: AC
  Administered 2011-07-21: 2.5 mg via RESPIRATORY_TRACT

## 2011-07-21 MED ORDER — METOPROLOL SUCCINATE ER 25 MG PO TB24
25.0000 mg | ORAL_TABLET | Freq: Every day | ORAL | Status: DC
  Administered 2011-07-21 – 2011-07-23 (×3): 25 mg via ORAL
  Filled 2011-07-21 (×3): qty 1

## 2011-07-21 NOTE — Progress Notes (Signed)
Occupational Therapy Evaluation Patient Details Name: Barry Thompson MRN: [id number] DOB: 1923/11/02 Today's Date: 07/21/2011  Problem List:  Patient Active Problem List  Diagnoses  . HTN (hypertension)  . Mitral valve disease  . Aortic stenosis, severe  . Mixed hyperlipidemia  . First degree atrioventricular block  . Dyspnea  . Chest pain  . Atrial fibrillation with rapid ventricular response  . Pericardial effusion  . Renal insufficiency  . Pleural effusion    Past Medical History:  Past Medical History  Diagnosis Date  . Hypertension   . Hyperlipidemia   . Aortic stenosis   . 1st degree AV block    Past Surgical History:  Past Surgical History  Procedure Date  . Tee without cardioversion 07/17/2011    Procedure: TRANSESOPHAGEAL ECHOCARDIOGRAM (TEE);  Surgeon: Lewayne Bunting, MD;  Location: [facility] ENDOSCOPY;  Service: Cardiovascular;  Laterality: N/A;  With Cardioversion    OT Assessment/Plan/Recommendation OT Assessment Clinical Impression Statement: pt. will benefit from skilled OT to increase functional independence with ADLs and decrease burden of care at D/C by getting pt. to supervision level with ADLs. OT Recommendation/Assessment: Patient will need skilled OT in the acute care venue OT Problem List: Decreased strength;Decreased activity tolerance;Decreased safety awareness;Decreased knowledge of use of DME or AE;Cardiopulmonary status limiting activity Barriers to Discharge: None OT Therapy Diagnosis : Generalized weakness OT Plan OT Frequency: Min 2X/week OT Treatment/Interventions: Self-care/ADL training;Energy conservation;Therapeutic activities;Patient/family education;Balance training OT Recommendation Follow Up Recommendations: Home health OT Equipment Recommended: Defer to next venue Individuals Consulted Consulted and Agree with Results and Recommendations: Patient;Family member/caregiver Family Member Consulted: son, daughter-in-law OT Goals Acute  Rehab OT Goals OT Goal Formulation: With patient/family Time For Goal Achievement: 2 weeks ADL Goals Pt Will Perform Grooming: with set-up;with supervision;Standing at sink ADL Goal: Grooming - Progress: Progressing toward goals Pt Will Perform Lower Body Bathing: with set-up;with supervision;Sit to stand from bed ADL Goal: Lower Body Bathing - Progress: Not met Pt Will Perform Lower Body Dressing: with supervision;with set-up;Sit to stand from bed ADL Goal: Lower Body Dressing - Progress: Not met Pt Will Transfer to Toilet: with supervision;with set-up;3-in-1;Stand pivot transfer ADL Goal: Toilet Transfer - Progress: Progressing toward goals Pt Will Perform Toileting - Hygiene: with set-up;with supervision;Sit to stand from 3-in-1/toilet ADL Goal: Toileting - Hygiene - Progress: Progressing toward goals  OT Evaluation Precautions/Restrictions  Precautions Precautions: Fall Required Braces or Orthoses: No Restrictions Weight Bearing Restrictions: No Prior Functioning Home Living Lives With: Spouse Receives Help From: Family Type of Home: House Home Layout: Two level;Full bath on main level;Able to live on main level with bedroom/bathroom Home Access: Stairs to enter Entrance Stairs-Number of Steps: 1 Bathroom Shower/Tub: Forensic scientist: Standard Bathroom Accessibility: Yes How Accessible: Accessible via walker Home Adaptive Equipment: Bedside commode/3-in-1;Shower chair with back Prior Function Level of Independence: Independent with basic ADLs;Independent with gait;Independent with transfers Able to Take Stairs?: Yes Driving: No Vocation: Retired ADL ADL Eating/Feeding: Performed;Independent Where Assessed - Eating/Feeding: Chair Grooming: Performed;Wash/dry hands;Wash/dry face;Teeth care;Minimal assistance;Set up Grooming Details (indicate cue type and reason): Min assist for balance due to intermittent posterior lean Where Assessed - Grooming:  Standing at sink Upper Body Bathing: Simulated;Chest;Right arm;Left arm;Minimal assistance Where Assessed - Upper Body Bathing: Sitting, bed Lower Body Bathing: Simulated;Maximal assistance Where Assessed - Lower Body Bathing: Sit to stand from bed Upper Body Dressing: Performed;Minimal assistance Upper Body Dressing Details (indicate cue type and reason): with donning gown Where Assessed - Upper Body Dressing: Sitting, bed Lower Body  Dressing: Simulated;Maximal assistance Lower Body Dressing Details (indicate cue type and reason): With donning bilateral socks due to unable to reach feet or cross foot over opposite knee Where Assessed - Lower Body Dressing: Sitting, bed Toilet Transfer: Minimal assistance;Performed Toilet Transfer Details (indicate cue type and reason): Min verbal cues for hand placement Toilet Transfer Method: Stand pivot Toilet Transfer Equipment: Bedside commode Toileting - Clothing Manipulation: Performed;Minimal assistance Toileting - Clothing Manipulation Details (indicate cue type and reason): with holding gown Where Assessed - Glass blower/designer Manipulation: Standing Toileting - Hygiene: Performed;+1 Total assistance Toileting - Hygiene Details (indicate cue type and reason): Pt. unable to reach backside and maintain standing position Where Assessed - Toileting Hygiene: Standing Tub/Shower Transfer: Not assessed Tub/Shower Transfer Method: Not assessed ADL Comments: Pt.  completed ADL tasks standing at the sink ~78mins with min verbal cues for deep breathing intermittently due to minor SOB. Pt. ambulated in room ~15 with min hand held assist. Vision/Perception  Vision - History Baseline Vision: No visual deficits Patient Visual Report: No change from baseline Vision - Assessment Eye Alignment: Within Functional Limits Vision Assessment: Vision not tested Cognition Cognition Arousal/Alertness: Awake/alert Overall Cognitive Status: Appears within functional  limits for tasks assessed Orientation Level: Oriented X4 Sensation/Coordination Coordination Gross Motor Movements are Fluid and Coordinated: Yes Fine Motor Movements are Fluid and Coordinated: Yes Extremity Assessment RUE Assessment RUE Assessment: Within Functional Limits LUE Assessment LUE Assessment: Within Functional Limits Mobility  Bed Mobility Bed Mobility: Yes Supine to Sit: 5: Supervision Supine to Sit Details (indicate cue type and reason): Verbal cues for sequence. Sit to Supine - Left: 5: Supervision Sit to Supine - Left Details (indicate cue type and reason): Verbal cues for sequence. Transfers Transfers: Yes Sit to Stand: 4: Min assist;With upper extremity assist;From bed;From chair/3-in-1 (2 trials.) Sit to Stand Details (indicate cue type and reason): Assist for balance with cues for hand placement and safety. Stand to Sit: 4: Min assist;With upper extremity assist;To bed;To chair/3-in-1 (2 trials.) Stand to Sit Details: Assist for balance with cues for hand placement and safety. End of Session OT - End of Session Equipment Utilized During Treatment: Gait belt Activity Tolerance: Patient tolerated treatment well Patient left: in bed;with call bell in reach;with family/visitor present Nurse Communication: Mobility status for transfers General Behavior During Session: [facility] for tasks performed Cognition: [facility] for tasks performed   Sadye Kiernan, OTR/L Pager [phone] 07/21/2011, 1:24 PM

## 2011-07-21 NOTE — Progress Notes (Signed)
   CARE MANAGEMENT NOTE 07/21/2011  Patient:  Barry Thompson, Barry Thompson   Account Number:  [id number]  Date Initiated:  07/21/2011  Documentation initiated by:  GRAVES-BIGELOW,Phala Schraeder  Subjective/Objective Assessment:   Pt admitted with moderate-severe AS and diastolic CHF admitted with atrial fibrillation/RVR/CHF exacerbation.  He had TEE-guided cardioversion about a week ago but failed to hold NSR so has been started on amiodarone.  Also with L>R pleural effusions.     Action/Plan:   Pt is from home with family. PER MD Notes: pt will need conversion to NSR => tomorrow if no thoracentesis.   Anticipated DC Date:  07/25/2011   Anticipated DC Plan:  HOME W HOME HEALTH SERVICES      DC Planning Services  CM consult      Choice offered to / List presented to:             Status of service:   Medicare Important Message given?   (If response is "NO", the following Medicare IM given date fields will be blank) Date Medicare IM given:   Date Additional Medicare IM given:    Discharge Disposition:    Per UR Regulation:    Comments:  07-21-11 1009 Gerome Apley [phone] CM will continue to monitor for d/c disposition.

## 2011-07-21 NOTE — Progress Notes (Addendum)
Patient ID: Barry Thompson, male   DOB: May 12, 1924, 78 y.o.   MRN: [id number]    SUBJECTIVE: Patient is short of breath with ambulation.  Still in atrial fibrillation.     Marland Kitchen amiodarone  400 mg Oral BID  . antiseptic oral rinse  15 mL Mouth Rinse BID  . cloNIDine  0.1 mg Oral BID  . cloNIDine  0.2 mg Oral Once  . furosemide  40 mg Intravenous BID  . ipratropium  0.5 mg Nebulization Once  . ipratropium  0.5 mg Nebulization Q4H  . levalbuterol  0.63 mg Nebulization Once  . levalbuterol  0.63 mg Nebulization Q4H  . losartan  100 mg Oral Daily  . mulitivitamin with minerals  1 tablet Oral Daily  . omega-3 acid ethyl esters  1 g Oral Daily  . patient's guide to using coumadin book   Does not apply Once  . potassium chloride  40 mEq Oral Once  . rivaroxaban  15 mg Oral Daily  . rosuvastatin  10 mg Oral q1800  . sodium chloride  3 mL Intravenous Q12H  . DISCONTD: aspirin  81 mg Oral Daily  . DISCONTD: rivaroxaban  10 mg Oral Daily      Filed Vitals:   07/21/11 0406 07/21/11 0500 07/21/11 0600 07/21/11 0700  BP:  127/60    Pulse:  86 85 84  Temp:      TempSrc:      Resp:  18 21 19   Height:      Weight:   102.3 kg (225 lb 8.5 oz)   SpO2: 98% 97% 99% 97%    Intake/Output Summary (Last 24 hours) at 07/21/11 0808 Last data filed at 07/21/11 0600  Gross per 24 hour  Intake 1173.5 ml  Output    875 ml  Net  298.5 ml    LABS: Basic Metabolic Panel:  Basename 07/20/11 0615 07/19/11 1134  NA 137 134*  K 4.1 4.1  CL 98 96  CO2 30 29  GLUCOSE 117* 130*  BUN 38* 45*  CREATININE 1.29 1.40*  CALCIUM 9.7 9.5  MG -- --  PHOS -- --   Liver Function Tests:  Basename 07/19/11 1706  AST --  ALT --  ALKPHOS --  BILITOT --  PROT 6.3  ALBUMIN --   No results found for this basename: LIPASE:2,AMYLASE:2 in the last 72 hours CBC:  Basename 07/21/11 0705  WBC 9.2  NEUTROABS --  HGB 13.5  HCT 40.3  MCV 85.7  PLT 254   Cardiac Enzymes: No results found for this  basename: CKTOTAL:3,CKMB:3,CKMBINDEX:3,TROPONINI:3 in the last 72 hours BNP: No components found with this basename: POCBNP:3 D-Dimer: No results found for this basename: DDIMER:2 in the last 72 hours Hemoglobin A1C: No results found for this basename: HGBA1C in the last 72 hours Fasting Lipid Panel:  Basename 07/19/11 1706  CHOL 101  HDL --  LDLCALC --  TRIG --  CHOLHDL --  LDLDIRECT --   Thyroid Function Tests: No results found for this basename: TSH,T4TOTAL,FREET3,T3FREE,THYROIDAB in the last 72 hours Anemia Panel: No results found for this basename: VITAMINB12,FOLATE,FERRITIN,TIBC,IRON,RETICCTPCT in the last 72 hours  RADIOLOGY:  Ct Angio Chest W/cm &/or Wo Cm  07/18/2011  *RADIOLOGY REPORT*  Clinical Data:  78 year old male with chest pain and shortness of breath.  CT ANGIOGRAPHY CHEST WITH CONTRAST  Technique:  Multidetector CT imaging of the chest was performed using the standard protocol during bolus administration of intravenous contrast.  Multiplanar CT image reconstructions including MIPs  were obtained to evaluate the vascular anatomy.  Contrast: OMNIPAQUE IOHEXOL 350 MG/ML IV SOLN  Comparison:  07/14/2011 chest radiograph  Findings:  This is a technically satisfactory study.  No pulmonary emboli are identified. There is no evidence of thoracic aortic aneurysm.  Mild cardiomegaly and moderate coronary artery calcifications are identified.  A moderate pericardial effusion is noted. Bilateral pleural effusions are identified, small on the right and small to moderate on the left. No enlarged lymph nodes are identified.  Moderate bilateral lower lung atelectasis identified. There is no discrete mass or airspace disease present. No endobronchial or endotracheal lesions are identified.  No acute or suspicious bony abnormalities are noted. The visualized upper abdomen is unremarkable.  Review of the MIP images confirms the above findings.  IMPRESSION: No evidence of pulmonary emboli  or thoracic aortic aneurysm.  Moderate pericardial effusion with bilateral pleural effusions, small on the right and small to moderate on the left.  Associated bilateral lower lung atelectasis.  Cardiomegaly and coronary artery disease.  Original Report Authenticated By: Rosendo Gros, M.D.   Dg Chest Port 1 View  07/21/2011  *RADIOLOGY REPORT*  Clinical Data: Follow up of pleural effusion.  PORTABLE CHEST - 1 VIEW  Comparison: 07/19/2011  Findings: Moderate cardiomegaly.  Moderate left-sided pleural effusion is not significantly changed. No pneumothorax.  There may be a small right pleural effusion.  Interstitial edema is improved with mild venous congestion remaining.  Persistent left base air space disease with resolved right base air space disease.  IMPRESSION:  1.  Improved aeration.  Interstitial edema is improved to resolved. 2.  Persistent left base air space disease with moderate left pleural effusion.  Original Report Authenticated By: Consuello Bossier, M.D.    PHYSICAL EXAM General: NAD Neck: JVP 10 cm, no thyromegaly or thyroid nodule.  Lungs: Decreased breath sounds at bases, L>R CV: Nondisplaced PMI.  Heart irregular S1/S2, no S3/S4, 3/6 systolic m RUSB.  No peripheral edema.  No carotid bruit.  Normal pedal pulses.  Abdomen: Soft, nontender, no hepatosplenomegaly, no distention.  Neurologic: Alert and oriented x 3.  Psych: Normal affect. Extremities: No clubbing or cyanosis.   TELEMETRY: Reviewed telemetry pt in atrial fibrillation, rate in 80s  ASSESSMENT AND PLAN:  78 yo with moderate-severe AS and diastolic CHF admitted with atrial fibrillation/RVR and CHF exacerbation.  He had TEE-guided cardioversion about a week ago but failed to hold NSR so has been started on amiodarone.  Also with L>R pleural effusions.  1. Aortic stenosis: Per Dr. Eden Emms, not candidate for open AVR and patient has not wanted evaluation for TAVR.  2. CHF: Acute on chronic diastolic CHF.  Likely triggered by  atrial fibrillation in the face of AS.  He will tolerate afib poorly, needs to be converted back to NSR.  He has bilateral pleural effusions likely due to CHF.  He is not on Lasix.  - Start Lasix 40 mg IV bid today - Will need conversion to NSR => tomorrow if no thoracentesis.  3. Atrial fibrillation: On amiodarone.  Will need cardioversion again now that amiodarone has been started.  Unfortunately, he has been on Xarelto 10 mg daily (inadequate dose).  He should be on 15 mg daily.  I will increase the dose for today.  Since he has been inadequately dosed on Xarelto, he will need TEE prior to repeat cardioversion.  Will add beta blocker (Toprol XL).  4. Pleural effusions: Moderate on left.  Suspect due to CHF.  No  PNA.  There was discussion over the weekend between Dr. Sung Amabile and Dr. Graciela Husbands about thoracentesis.  If we are to do this, will have to hold Xarelto and delay cardioversion.  He has not been on Lasix, this could resolve the problem on its own.  I have spoken with Dr. Tyson Alias who will see him today.  We will decide on whether or not thoracentesis is needed.  5. Renal: Follow creatinine with diuresis.   [name] 07/21/2011 8:17 AM  Discussed with Dr. Sung Amabile.  There is a question of another etiology for the pleural effusions (larger on left rather than right, which would be unusual with CHF) and associated with pericardial effusion.  ? Inflammatory process.  He is currently under-dosed on Xarelto.  Dr. Sung Amabile will discuss with patient doing thoracentesis today with some increased risk from the under-dosed Xarelto.   [name] 07/21/2011 8:29 AM

## 2011-07-21 NOTE — Progress Notes (Addendum)
Name: Barry Thompson MRN: [id number] DOB: [birth year]    LOS: 7 PCP:  Dr. Murray Hodgkins PCCM Progress Note :     History of Present Illness:  78 y/o M with PMH of HTN, HLD, Aortic Stenosis- not candidate for surgery, and hx of prostadmitted on 12/31 with complaints of chest pain and shortness of breath.  Scheduled TEE 1/3 with cardioversion per Dr. Jens Som.  Post procedure, remained in SR but unfortunately went back into afib. He indicates he was noted to have a murmur by Dr. Chilton Si and was sent to Dr. Jens Som for evaluation in 05/2011.  Prior to admit, he was able to walk a block with his dog before resting.  Smoked for two years when in the [place] (age 53, 14) and then stopped.  Previous work history includes truck driving.  Currently, shortness of breath improved but remains dyspneic with activity.  Difficult to transfer from bed to chair.  Denies chills, weight loss, chest pain at present, cough, sputum production. PCCM consulted 1/4 for dyspnea.     Antibiotics: none   Tests / Events: 02/20/11 ECHO>>>EF >55%, nml systolic function, imparired LV relaxation RA/RV nml  1/3 TEE with cardioversion>>>SR briefly but then back to afib.  TEE limited study due to patient tolerance.   1/4 CT chest > No evidence of pulmonary emboli or thoracic aortic aneurysm. Moderate pericardial effusion with bilateral pleural effusions, small on the right and small to moderate on the left.   Overnight:  No sig change.   Vital Signs: Temp:  [97.9 F (36.6 C)-98.8 F (37.1 C)] 98.3 F (36.8 C) (01/07 1147) Pulse Rate:  [31-114] 82  (01/07 1200) Resp:  [17-31] 21  (01/07 1200) BP: (112-211)/(44-95) 121/53 mmHg (01/07 1200) SpO2:  [89 %-100 %] 97 % (01/07 1147) Weight:  [102.3 kg (225 lb 8.5 oz)] 225 lb 8.5 oz (102.3 kg) (01/07 0600) I/O last 3 completed shifts: In: 1770.6 [P.O.:1300; I.V.:470.6] Out: 1625 [Urine:1625]  Physical Examination: General: wdwn elderly male in NAD Neuro: AAOx4, speech clear,  MAE CV: s1s2 irregularly irregular, gr2/6 SM  PULM: resp's even / non-labored at rest, lungs bilaterally diminished with faint posterior crackles lower NW:GNFAO, soft, bsx4 active, tol PO's Extremities: warm/dry, no edema    Labs     CBC    Component Value Date/Time   WBC 9.2 07/21/2011 0705   RBC 4.70 07/21/2011 0705   HGB 13.5 07/21/2011 0705   HCT 40.3 07/21/2011 0705   PLT 254 07/21/2011 0705   MCV 85.7 07/21/2011 0705   MCH 28.7 07/21/2011 0705   MCHC 33.5 07/21/2011 0705   RDW 13.8 07/21/2011 0705   LYMPHSABS 1.2 07/14/2011 1120   MONOABS 1.2* 07/14/2011 1120   EOSABS 0.0 07/14/2011 1120   BASOSABS 0.1 07/14/2011 1120        BMET    Component Value Date/Time   NA 139 07/21/2011 0941   K 4.5 07/21/2011 0941   CL 98 07/21/2011 0941   CO2 33* 07/21/2011 0941   GLUCOSE 139* 07/21/2011 0941   BUN 30* 07/21/2011 0941   CREATININE 1.21 07/21/2011 0941   CALCIUM 9.7 07/21/2011 0941   GFRNONAA 53* 07/21/2011 0941   GFRAA 61* 07/21/2011 0941    Radiology:  Assessment and Plan:  Dyspnea Assessment: Likely multifactorial w most significant contributor AS, new onset afib with variable rate up to 170's (now controlled).  No real history to support COPD (non-smoker, no occupational exposures etc, no changes on cxr c/w copd).   1/5 CT  chest neg for PE, , mod pericardial effusion w/ bilateral left > right pleural effusions  cxr with increased effusion on left   Plan: -PFT's to assess lung function  -hold on throacentsis for now -discuss futher with card tomorrow.   Chest pain /Atrial fibrillation with rapid ventricular response Assessment: Plan: Thoracentesis today to r/o inflammatory plerupericarditis  07/21/2011, 1:09 PM    Billy Fischer, MD;  PCCM service; Mobile [phone]   ADD: Thoracentesis was without complications. OK to anticoagulate fully after today  Billy Fischer, MD;  PCCM service; Mobile [phone]

## 2011-07-21 NOTE — Progress Notes (Signed)
Physical Therapy Treatment Patient Details Name: Barry Thompson MRN: [id number] DOB: August 27, 1923 Today's Date: 07/21/2011  PT Assessment/Plan  PT - Assessment/Plan Comments on Treatment Session: Pt admitted for dyspnea and continues to progress with activity tolerance and independence.  May consider trial of LRAD with ambulation to increase independence.  Limited today by orthostatic BP.  Co-treatment with OT. PT Plan: Discharge plan remains appropriate;Frequency remains appropriate PT Frequency: Min 3X/week Follow Up Recommendations: Skilled nursing facility Equipment Recommended: Defer to next venue PT Goals  Acute Rehab PT Goals PT Goal Formulation: With patient Time For Goal Achievement: 2 weeks PT Goal: Supine/Side to Sit - Progress: Progressing toward goal PT Goal: Sit to Supine/Side - Progress: Progressing toward goal PT Goal: Sit to Stand - Progress: Progressing toward goal PT Goal: Stand to Sit - Progress: Progressing toward goal PT Goal: Ambulate - Progress: Progressing toward goal  PT Treatment Precautions/Restrictions  Precautions Precautions: Fall Required Braces or Orthoses: No Restrictions Weight Bearing Restrictions: No Pain 0/10 with treatment. Vitals Blood Pressure     Heart Rate Supine 200/79 94  Sitting 152/81 106  Standing 116/44 114   Mobility (including Balance) Bed Mobility Bed Mobility: Yes Supine to Sit: 5: Supervision Supine to Sit Details (indicate cue type and reason): Verbal cues for sequence. Sit to Supine - Left: 5: Supervision Sit to Supine - Left Details (indicate cue type and reason): Verbal cues for sequence. Transfers Transfers: Yes Sit to Stand: 4: Min assist;With upper extremity assist;From bed;From chair/3-in-1 (2 trials.) Sit to Stand Details (indicate cue type and reason): Assist for balance with cues for hand placement and safety. Stand to Sit: 4: Min assist;With upper extremity assist;To bed;To chair/3-in-1 (2 trials.) Stand to  Sit Details: Assist for balance with cues for hand placement and safety. Ambulation/Gait Ambulation/Gait: Yes Ambulation/Gait Assistance: 4: Min assist Ambulation/Gait Assistance Details (indicate cue type and reason): Assist for balance with cues to extend posture and for safety. Ambulation Distance (Feet): 20 Feet (Distance limited by orthostatic BP (see vitals).) Assistive device: 1 person hand held assist Gait Pattern: Decreased step length - right;Decreased step length - left;Shuffle;Trunk flexed Gait velocity: Guarded velocity. Stairs: No Corporate treasurer: No  Posture/Postural Control Posture/Postural Control: No significant limitations Balance Balance Assessed: No End of Session PT - End of Session Equipment Utilized During Treatment: Gait belt Activity Tolerance: Patient limited by fatigue Patient left: in bed;with call bell in reach;with family/visitor present Nurse Communication: Mobility status for transfers;Mobility status for ambulation General Behavior During Session: [facility] for tasks performed Cognition: [facility] for tasks performed  Cephus Shelling 07/21/2011, 1:21 PM  07/21/2011 Cephus Shelling, PT, DPT [phone]

## 2011-07-21 NOTE — Procedures (Addendum)
Thoracentesis Procedure Note  Pre-operative Diagnosis: Unexplained pleural effusions, L>R   Post-operative Diagnosis: same  Indications: Pleural effusion  Procedure Details  Consent: Informed consent was obtained. Risks of the procedure were discussed including: infection, bleeding, pain, pneumothorax.  Under sterile conditions the patient was positioned. Betadine solution and sterile drapes were utilized.  1% buffered lidocaine was used to anesthetize the 8th rib space. Fluid was obtained without any difficulties and minimal blood loss.  A dressing was applied to the wound and wound care instructions were provided.   Findings 950 ml of amber pleural fluid was obtained. A sample was sent to Pathology for cytogenetics, flow, and cell counts, as well as for infection analysis.  Complications:  None; patient tolerated the procedure well.          Condition: stable  Plan A follow up chest x-ray was ordered. Bed Rest for 0 hours. Tylenol 650 mg. for pain.  Attending Attestation: I performed the procedure.  STUDIES ORDERED: LDH, prot, cell count, cytology  Billy Fischer, MD;  PCCM service; Mobile [phone]

## 2011-07-22 LAB — CBC
Hemoglobin: 12.4 g/dL — ABNORMAL LOW (ref 13.0–17.0)
MCH: 27.5 pg (ref 26.0–34.0)
MCHC: 32 g/dL (ref 30.0–36.0)

## 2011-07-22 LAB — FUNGAL STAIN: Fungal Smear: NONE SEEN

## 2011-07-22 LAB — BASIC METABOLIC PANEL
BUN: 31 mg/dL — ABNORMAL HIGH (ref 6–23)
GFR calc non Af Amer: 47 mL/min — ABNORMAL LOW (ref >90)
Glucose, Bld: 114 mg/dL — ABNORMAL HIGH (ref 70–99)
Potassium: 4.8 mEq/L (ref 3.5–5.1)

## 2011-07-22 LAB — LACTATE DEHYDROGENASE: LDH: 184 U/L (ref 94–250)

## 2011-07-22 MED ORDER — HYDROCORTISONE 1 % EX CREA
1.0000 "application " | TOPICAL_CREAM | Freq: Three times a day (TID) | CUTANEOUS | Status: DC | PRN
  Filled 2011-07-22: qty 28

## 2011-07-22 MED ORDER — LOSARTAN POTASSIUM 50 MG PO TABS
50.0000 mg | ORAL_TABLET | Freq: Every day | ORAL | Status: DC
  Administered 2011-07-22 – 2011-07-24 (×3): 50 mg via ORAL
  Filled 2011-07-22 (×3): qty 1

## 2011-07-22 MED ORDER — SODIUM CHLORIDE 0.9 % IV SOLN: 250.0000 mL | INTRAVENOUS | Status: DC

## 2011-07-22 MED ORDER — LEVALBUTEROL HCL 0.63 MG/3ML IN NEBU: 0.6300 mg | INHALATION_SOLUTION | RESPIRATORY_TRACT | Status: DC | PRN

## 2011-07-22 MED ORDER — SODIUM CHLORIDE 0.9 % IJ SOLN: 3.0000 mL | INTRAMUSCULAR | Status: DC | PRN

## 2011-07-22 MED ORDER — SODIUM CHLORIDE 0.9 % IJ SOLN: 3.0000 mL | Freq: Two times a day (BID) | INTRAMUSCULAR | Status: DC

## 2011-07-22 NOTE — Progress Notes (Signed)
Name: Barry Thompson MRN: [id number] DOB: 09-14-1923    LOS: 8 PCP:  Dr. Murray Hodgkins PCCM Progress Note :     History of Present Illness:  78 y/o M with PMH of HTN, HLD, mod-sever Aortic Stenosis- not candidate for surgery, admitted with a-fib wit hRVR and CHF exacerbation. Also L> R pleural effusion. Failed TEE guided cardioversion to convert.PCCM consulted for dyspnea.   Antibiotics: none  Tests / Events: 02/20/11 ECHO>>>EF >55%, nml systolic function, imparired LV relaxation RA/RV nml  1/3 TEE with cardioversion>>>SR briefly but then back to afib.  TEE limited study due to patient tolerance.   1/4 CT chest > No evidence of pulmonary emboli or thoracic aortic aneurysm. Moderate pericardial effusion with bilateral pleural effusions, small on the right and small to moderate on the left.   1/7: Left sided thoracocentesis without complication, remains in a-fib, asymptomatic supine hypertension orthostatic hypotension,  1/8: TEE cardioversion today  Vital Signs: Temp:  [97.5 F (36.4 C)-99.3 F (37.4 C)] 98.1 F (36.7 C) (01/08 0758) Pulse Rate:  [31-114] 88  (01/08 0758) Resp:  [17-28] 24  (01/08 0800) BP: (115-200)/(44-120) 153/87 mmHg (01/08 0800) SpO2:  [87 %-100 %] 98 % (01/08 0800) Weight:  [217 lb 9.5 oz (98.7 kg)] 217 lb 9.5 oz (98.7 kg) (01/08 0457) I/O last 3 completed shifts: In: 1560 [P.O.:1560] Out: 3875 [Urine:2925; Other:950]  Physical Examination: General: wdwn elderly male in NAD Neuro: AAOx4, speech clear, MAE CV: s1s2 irregularly irregular, gr2/6 SM  PULM: resp's even / non-labored at rest, lungs bilaterally diminished with faint posterior crackles lower WU:JWJXB, soft, bsx4 active, tol PO's Extremities: warm/dry, no edema  Labs     CBC    Component Value Date/Time   WBC 9.5 07/22/2011 0550   RBC 4.51 07/22/2011 0550   HGB 12.4* 07/22/2011 0550   HCT 38.7* 07/22/2011 0550   PLT 260 07/22/2011 0550   MCV 85.8 07/22/2011 0550   MCH 27.5 07/22/2011 0550   MCHC  32.0 07/22/2011 0550   RDW 13.6 07/22/2011 0550   LYMPHSABS 1.2 07/14/2011 1120   MONOABS 1.2* 07/14/2011 1120   EOSABS 0.0 07/14/2011 1120   BASOSABS 0.1 07/14/2011 1120        BMET    Component Value Date/Time   NA 138 07/22/2011 0550   K 4.8 07/22/2011 0550   CL 99 07/22/2011 0550   CO2 32 07/22/2011 0550   GLUCOSE 114* 07/22/2011 0550   BUN 31* 07/22/2011 0550   CREATININE 1.34 07/22/2011 0550   CALCIUM 9.4 07/22/2011 0550   GFRNONAA 47* 07/22/2011 0550   GFRAA 54* 07/22/2011 0550    Radiology:  Assessment and Plan:  Dyspnea Assessment: Likely multifactorial w most significant contributor AS, new onset afib with variable rate up to 170's (now controlled).  No real history to support COPD (non-smoker, no occupational exposures etc, no changes on cxr c/w copd).   1/5 CT chest neg for PE, , mod pericardial effusion w/ bilateral left > right pleural effusions  cxr with increased effusion on left   Plan: -PFT's to assess lung function -no new thoracentesis today, agree with diuresis  Chest pain /Atrial fibrillation with rapid ventricular response Assessment: Plan: -TEE DC cardioversion today per cardiology  Max Fickle [phone]

## 2011-07-22 NOTE — Progress Notes (Signed)
Clinical Social Worker completed psychosocial assessment, which can be found in shadow chart. CSW spoke with patient regarding discharge plans of short term SNF for rehab. Patient was agreeable to this and provided CSW with three choices to fax his information to. FL2 is complete and can be found in shadow chart and CSW will initiate bed search of the three facilities requested by the patient. CSW will continue to follow.   Rozetta Nunnery MSW, [employer] [phone]

## 2011-07-22 NOTE — Progress Notes (Signed)
Patient ID: Barry Thompson, male   DOB: 04/25/1924, 78 y.o.   MRN: [id number]     SUBJECTIVE: Patient diuresed well yesterday.  Still in atrial fibrillation.  Noted to be very orthostatic by BP when standing but not symptomatic.  Left-sided thoracentesis yesterday with no complication.      Marland Kitchen albuterol  2.5 mg Nebulization Once  . amiodarone  400 mg Oral BID  . antiseptic oral rinse  15 mL Mouth Rinse BID  . cloNIDine  0.1 mg Oral BID  . furosemide  40 mg Intravenous BID  . ipratropium  0.5 mg Nebulization Once  . levalbuterol  0.63 mg Nebulization Once  . losartan  50 mg Oral Daily  . metoprolol succinate  25 mg Oral Daily  . mulitivitamin with minerals  1 tablet Oral Daily  . omega-3 acid ethyl esters  1 g Oral Daily  . patient's guide to using coumadin book   Does not apply Once  . potassium chloride  40 mEq Oral Once  . rivaroxaban  15 mg Oral Daily  . rosuvastatin  10 mg Oral q1800  . sodium chloride  3 mL Intravenous Q12H  . sodium chloride  3 mL Intravenous Q12H  . DISCONTD: aspirin  81 mg Oral Daily  . DISCONTD: ipratropium  0.5 mg Nebulization Q4H  . DISCONTD: levalbuterol  0.63 mg Nebulization Q4H  . DISCONTD: losartan  100 mg Oral Daily  . DISCONTD: potassium chloride  40 mEq Oral Once  . DISCONTD: rivaroxaban  10 mg Oral Daily      Filed Vitals:   07/22/11 0200 07/22/11 0300 07/22/11 0400 07/22/11 0457  BP: 130/72 130/77 115/66   Pulse: 79 79 64   Temp:   98.7 F (37.1 C)   TempSrc:   Oral   Resp: 24 17    Height:      Weight:    98.7 kg (217 lb 9.5 oz)  SpO2: 99% 99% 100%     Intake/Output Summary (Last 24 hours) at 07/22/11 0745 Last data filed at 07/22/11 0459  Gross per 24 hour  Intake    960 ml  Output   3500 ml  Net  -2540 ml    LABS: Basic Metabolic Panel:  Basename 07/22/11 0550 07/21/11 0941  NA 138 139  K 4.8 4.5  CL 99 98  CO2 32 33*  GLUCOSE 114* 139*  BUN 31* 30*  CREATININE 1.34 1.21  CALCIUM 9.4 9.7  MG -- --  PHOS -- --     Liver Function Tests:  Basename 07/22/11 0550 07/19/11 1706  AST -- --  ALT -- --  ALKPHOS -- --  BILITOT -- --  PROT 6.5 6.3  ALBUMIN -- --   No results found for this basename: LIPASE:2,AMYLASE:2 in the last 72 hours CBC:  Basename 07/22/11 0550 07/21/11 0705  WBC 9.5 9.2  NEUTROABS -- --  HGB 12.4* 13.5  HCT 38.7* 40.3  MCV 85.8 85.7  PLT 260 254   ESR 70  Pleural fluid/serum LDH ratio = 0.51 Pleural fluid/serum total protein ratio = 0.47  RADIOLOGY:  Ct Angio Chest W/cm &/or Wo Cm  07/18/2011  *RADIOLOGY REPORT*  Clinical Data:  78 year old male with chest pain and shortness of breath.  CT ANGIOGRAPHY CHEST WITH CONTRAST  Technique:  Multidetector CT imaging of the chest was performed using the standard protocol during bolus administration of intravenous contrast.  Multiplanar CT image reconstructions including MIPs were obtained to evaluate the vascular anatomy.  Contrast:  OMNIPAQUE IOHEXOL 350 MG/ML IV SOLN  Comparison:  07/14/2011 chest radiograph  Findings:  This is a technically satisfactory study.  No pulmonary emboli are identified. There is no evidence of thoracic aortic aneurysm.  Mild cardiomegaly and moderate coronary artery calcifications are identified.  A moderate pericardial effusion is noted. Bilateral pleural effusions are identified, small on the right and small to moderate on the left. No enlarged lymph nodes are identified.  Moderate bilateral lower lung atelectasis identified. There is no discrete mass or airspace disease present. No endobronchial or endotracheal lesions are identified.  No acute or suspicious bony abnormalities are noted. The visualized upper abdomen is unremarkable.  Review of the MIP images confirms the above findings.  IMPRESSION: No evidence of pulmonary emboli or thoracic aortic aneurysm.  Moderate pericardial effusion with bilateral pleural effusions, small on the right and small to moderate on the left.  Associated bilateral  lower lung atelectasis.  Cardiomegaly and coronary artery disease.  Original Report Authenticated By: Rosendo Gros, M.D.    PHYSICAL EXAM General: NAD Neck: JVP 10 cm, no thyromegaly or thyroid nodule.  Lungs: Decreased breath sounds at bases, L>R CV: Nondisplaced PMI.  Heart irregular S1/S2, no S3/S4, 3/6 systolic m RUSB.  No peripheral edema.  No carotid bruit.  Normal pedal pulses.  Abdomen: Soft, nontender, no hepatosplenomegaly, no distention.  Neurologic: Alert and oriented x 3.  Psych: Normal affect. Extremities: No clubbing or cyanosis.   TELEMETRY: Reviewed telemetry pt in atrial fibrillation, rate in 80s  ASSESSMENT AND PLAN:  78 yo with moderate-severe AS and diastolic CHF admitted with atrial fibrillation/RVR and CHF exacerbation.  He had TEE-guided cardioversion about a week ago but failed to hold NSR so has been started on amiodarone.  Also with L>R pleural effusions.  1. Aortic stenosis: Per Dr. Eden Emms, not candidate for open AVR and patient has not wanted evaluation for TAVR.  2. CHF: Acute on chronic diastolic CHF.  Likely triggered by atrial fibrillation in the face of AS.  He will tolerate afib poorly, needs to be converted back to NSR.  He has bilateral pleural effusions likely due to CHF.  He is diuresing well.  - Continue Lasix 40 mg IV bid today - Will need conversion to NSR => will do this by TEE-cardioversion tomorrow after 3rd dose of therapeutic Xarelto (had been underdosed on Xarelto until yesterday).  3. Atrial fibrillation: On amiodarone.  Will need cardioversion again now that amiodarone has been started.  Unfortunately, he had been on Xarelto 10 mg daily until yesterday (inadequate dose).  He will need 3 doses of Xarelto 15 mg daily, 3rd dose will be tomorrow morning.  Since he has been inadequately dosed on Xarelto, he will need TEE prior to repeat cardioversion.   4. Pleural effusions: Status post thoracentesis.  Effusion is transudative by Light's criteria  (pleural/serum LDH 0.51, pleural/serum total protein 0.47).  This would suggest CHF-related.  However, interestingly his ESR is quite high which would tend to suggest an inflammatory process (also with both pericardial and pleural effusions).  5. Renal: Follow creatinine with diuresis.  6. Orthostasis: Patient had supine hypertension with a rather profound BP drop with standing yesterday.  I will cut back on losartan to 50 mg daily but do not want to go much lower with his significant supine hypertension.  He does not note lightheadedness.   7.  Out of bed, ambulate.   [name] 07/22/2011 7:45 AM

## 2011-07-22 NOTE — Progress Notes (Signed)
UR Completed.  Barry Thompson [phone] 07/22/2011  

## 2011-07-23 ENCOUNTER — Encounter (HOSPITAL_COMMUNITY): Payer: Self-pay | Admitting: *Deleted

## 2011-07-23 ENCOUNTER — Encounter (HOSPITAL_COMMUNITY)
Admission: EM | Disposition: A | Discharge status: Skilled Nursing Facility | Payer: Self-pay | Source: Home / Self Care | Attending: Cardiology

## 2011-07-23 DIAGNOSIS — I4891 Unspecified atrial fibrillation: Secondary | ICD-10-CM

## 2011-07-23 DIAGNOSIS — I5023 Acute on chronic systolic (congestive) heart failure: Secondary | ICD-10-CM

## 2011-07-23 LAB — BASIC METABOLIC PANEL
BUN: 33 mg/dL — ABNORMAL HIGH (ref 6–23)
CO2: 35 mEq/L — ABNORMAL HIGH (ref 19–32)
GFR calc non Af Amer: 47 mL/min — ABNORMAL LOW (ref >90)
Glucose, Bld: 107 mg/dL — ABNORMAL HIGH (ref 70–99)
Potassium: 4.4 mEq/L (ref 3.5–5.1)

## 2011-07-23 LAB — CBC
Hemoglobin: 12.2 g/dL — ABNORMAL LOW (ref 13.0–17.0)
MCH: 27.3 pg (ref 26.0–34.0)
MCHC: 31.8 g/dL (ref 30.0–36.0)
MCV: 85.9 fL (ref 78.0–100.0)

## 2011-07-23 SURGERY — TRANSESOPHAGEAL ECHOCARDIOGRAM WITH CARDIOVERSION

## 2011-07-23 MED ORDER — SODIUM CHLORIDE 0.9 % IJ SOLN: 3.0000 mL | Freq: Two times a day (BID) | INTRAMUSCULAR | Status: DC

## 2011-07-23 MED ORDER — SODIUM CHLORIDE 0.9 % IV SOLN: 250.0000 mL | INTRAVENOUS | Status: DC | PRN

## 2011-07-23 MED ORDER — SODIUM CHLORIDE 0.9 % IJ SOLN: 3.0000 mL | INTRAMUSCULAR | Status: DC | PRN

## 2011-07-23 MED ORDER — FENTANYL CITRATE 0.05 MG/ML IJ SOLN
INTRAMUSCULAR | Status: AC
  Filled 2011-07-23: qty 2

## 2011-07-23 MED ORDER — BENZOCAINE 20 % MT SOLN
1.0000 "application " | OROMUCOSAL | Status: DC | PRN
  Filled 2011-07-23: qty 57

## 2011-07-23 MED ORDER — SODIUM CHLORIDE 0.9 % IJ SOLN
3.0000 mL | Freq: Two times a day (BID) | INTRAMUSCULAR | Status: DC
  Administered 2011-07-23 – 2011-07-24 (×3): 3 mL via INTRAVENOUS

## 2011-07-23 MED ORDER — FENTANYL CITRATE 0.05 MG/ML IJ SOLN: 250.0000 ug | Freq: Once | INTRAMUSCULAR | Status: DC

## 2011-07-23 MED ORDER — MIDAZOLAM HCL 10 MG/2ML IJ SOLN
INTRAMUSCULAR | Status: AC
  Filled 2011-07-23: qty 2

## 2011-07-23 MED ORDER — AMIODARONE HCL 200 MG PO TABS
200.0000 mg | ORAL_TABLET | Freq: Two times a day (BID) | ORAL | Status: DC
  Administered 2011-07-24: 200 mg via ORAL
  Filled 2011-07-23 (×3): qty 1

## 2011-07-23 MED ORDER — FENTANYL CITRATE 0.05 MG/ML IJ SOLN
INTRAMUSCULAR | Status: DC | PRN
  Administered 2011-07-23 (×3): 25 ug via INTRAVENOUS

## 2011-07-23 MED ORDER — MIDAZOLAM HCL 10 MG/2ML IJ SOLN
INTRAMUSCULAR | Status: DC | PRN
  Administered 2011-07-23: 2 mg via INTRAVENOUS
  Administered 2011-07-23 (×3): 1 mg via INTRAVENOUS

## 2011-07-23 MED ORDER — MIDAZOLAM HCL 10 MG/2ML IJ SOLN: 10.0000 mg | Freq: Once | INTRAMUSCULAR | Status: DC

## 2011-07-23 MED ORDER — SODIUM CHLORIDE 0.45 % IV SOLN: INTRAVENOUS | Status: DC

## 2011-07-23 NOTE — Procedures (Signed)
Procedure: TEE  Indication: Atrial fibrillation, pre-cardioversion.  Sedation: 2 mg IV Versed, 25 mcg IV Fentanyl  Findings: Moderate LVH, EF 55%.  At least moderate aortic stenosis with mean gradient 30 mmHg.  Moderate pericardial effusion that did not appear to be causing hemodynamic consequence.  No LAA thrombus.   Complications: None.  Will proceed to DCCV.

## 2011-07-23 NOTE — Progress Notes (Signed)
Patient ID: SHADRACH BARTUNEK, male   DOB: 10-19-1923, 78 y.o.   MRN: [id number]     SUBJECTIVE: Patient diuresed well yesterday.  Still in atrial fibrillation.  Walked yesterday with minimal dyspnea, not lightheaded.    Marland Kitchen amiodarone  400 mg Oral BID  . antiseptic oral rinse  15 mL Mouth Rinse BID  . cloNIDine  0.1 mg Oral BID  . fentaNYL  250 mcg Intravenous Once  . furosemide  40 mg Intravenous BID  . ipratropium  0.5 mg Nebulization Once  . levalbuterol  0.63 mg Nebulization Once  . losartan  50 mg Oral Daily  . metoprolol succinate  25 mg Oral Daily  . midazolam  10 mg Intravenous Once  . mulitivitamin with minerals  1 tablet Oral Daily  . omega-3 acid ethyl esters  1 g Oral Daily  . patient's guide to using coumadin book   Does not apply Once  . rivaroxaban  15 mg Oral Daily  . rosuvastatin  10 mg Oral q1800  . sodium chloride  3 mL Intravenous Q12H  . sodium chloride  3 mL Intravenous Q12H  . sodium chloride  3 mL Intravenous Q12H      Filed Vitals:   07/23/11 0350 07/23/11 0400 07/23/11 0500 07/23/11 0738  BP: 137/64   152/73  Pulse:    81  Temp:  97.6 F (36.4 C)  97.8 F (36.6 C)  TempSrc:  Oral  Oral  Resp:      Height:      Weight:   97 kg (213 lb 13.5 oz)   SpO2: 99%   99%    Intake/Output Summary (Last 24 hours) at 07/23/11 0800 Last data filed at 07/23/11 0600  Gross per 24 hour  Intake    480 ml  Output   2450 ml  Net  -1970 ml    LABS: Basic Metabolic Panel:  Basename 07/23/11 0542 07/22/11 0550  NA 139 138  K 4.4 4.8  CL 98 99  CO2 35* 32  GLUCOSE 107* 114*  BUN 33* 31*  CREATININE 1.34 1.34  CALCIUM 9.3 9.4  MG -- --  PHOS -- --   Liver Function Tests:  Basename 07/22/11 0550  AST --  ALT --  ALKPHOS --  BILITOT --  PROT 6.5  ALBUMIN --   No results found for this basename: LIPASE:2,AMYLASE:2 in the last 72 hours CBC:  Basename 07/23/11 0542 07/22/11 0550  WBC 9.2 9.5  NEUTROABS -- --  HGB 12.2* 12.4*  HCT 38.4* 38.7*    MCV 85.9 85.8  PLT 282 260   ESR 70  Pleural fluid/serum LDH ratio = 0.51 Pleural fluid/serum total protein ratio = 0.47  RADIOLOGY:  Ct Angio Chest W/cm &/or Wo Cm  07/18/2011  *RADIOLOGY REPORT*  Clinical Data:  78 year old male with chest pain and shortness of breath.  CT ANGIOGRAPHY CHEST WITH CONTRAST  Technique:  Multidetector CT imaging of the chest was performed using the standard protocol during bolus administration of intravenous contrast.  Multiplanar CT image reconstructions including MIPs were obtained to evaluate the vascular anatomy.  Contrast: OMNIPAQUE IOHEXOL 350 MG/ML IV SOLN  Comparison:  07/14/2011 chest radiograph  Findings:  This is a technically satisfactory study.  No pulmonary emboli are identified. There is no evidence of thoracic aortic aneurysm.  Mild cardiomegaly and moderate coronary artery calcifications are identified.  A moderate pericardial effusion is noted. Bilateral pleural effusions are identified, small on the right and small to moderate  on the left. No enlarged lymph nodes are identified.  Moderate bilateral lower lung atelectasis identified. There is no discrete mass or airspace disease present. No endobronchial or endotracheal lesions are identified.  No acute or suspicious bony abnormalities are noted. The visualized upper abdomen is unremarkable.  Review of the MIP images confirms the above findings.  IMPRESSION: No evidence of pulmonary emboli or thoracic aortic aneurysm.  Moderate pericardial effusion with bilateral pleural effusions, small on the right and small to moderate on the left.  Associated bilateral lower lung atelectasis.  Cardiomegaly and coronary artery disease.  Original Report Authenticated By: Rosendo Gros, M.D.    PHYSICAL EXAM General: NAD Neck: JVP 8-9 cm, no thyromegaly or thyroid nodule.  Lungs: Decreased breath sounds at bases, L>R CV: Nondisplaced PMI.  Heart irregular S1/S2, no S3/S4, 3/6 systolic m RUSB.  No peripheral  edema.  No carotid bruit.  Normal pedal pulses.  Abdomen: Soft, nontender, no hepatosplenomegaly, no distention.  Neurologic: Alert and oriented x 3.  Psych: Normal affect. Extremities: No clubbing or cyanosis.   TELEMETRY: Reviewed telemetry pt in atrial fibrillation, rate in 80s  ASSESSMENT AND PLAN:  78 yo with moderate-severe AS and diastolic CHF admitted with atrial fibrillation/RVR and CHF exacerbation.  He had TEE-guided cardioversion about a week ago but failed to hold NSR so has been started on amiodarone.  Also with L>R pleural effusions.  1. Aortic stenosis: Per Dr. Eden Emms, not candidate for open AVR and patient has not wanted evaluation for TAVR.  2. CHF: Acute on chronic diastolic CHF.  Likely triggered by atrial fibrillation in the face of AS.  He will tolerate afib poorly, needs to be converted back to NSR.  He has bilateral pleural effusions likely due to CHF.  He is diuresing well.  - Continue Lasix 40 mg IV bid today - Will need conversion to NSR => will do this by TEE-cardioversion today after 3rd dose of therapeutic Xarelto (had been underdosed on Xarelto until Monday).  3. Atrial fibrillation: On amiodarone.  Will need cardioversion again now that amiodarone has been started.  Unfortunately, he had been on Xarelto 10 mg daily until Monday (inadequate dose).  He will need 3 doses of Xarelto 15 mg daily, 3rd dose will be today.  Since he has been inadequately dosed on Xarelto, he will need TEE prior to repeat cardioversion => TEE cardioversion today.   4. Pleural effusions: Status post thoracentesis.  Effusion is transudative by Light's criteria (pleural/serum LDH 0.51, pleural/serum total protein 0.47).  This would suggest CHF-related.  However, interestingly his ESR is quite high which would tend to suggest an inflammatory process (also with both pericardial and pleural effusions).  5. Renal: Follow creatinine with diuresis, stable so far.  6. HTN: BP running rather high at  times.  He denies orthostatic symptoms.  Will add low dose amlodipine.   7. Short term rehab when ready for discharge, ? Tomorrow.   [name] 07/23/2011 8:00 AM

## 2011-07-23 NOTE — Progress Notes (Signed)
Name: Barry Thompson MRN: [id number] DOB: 1924-02-11    LOS: 9 PCP:  Dr. Murray Hodgkins PCCM Progress Note :     History of Present Illness: 78 y/o M with PMH of HTN, HLD, mod-sever Aortic Stenosis- not candidate for surgery, admitted with a-fib wit hRVR and CHF exacerbation. Also L> R pleural effusion. Failed TEE guided cardioversion to convert.PCCM consulted for dyspnea.   Antibiotics: none  Tests / Events: 02/20/11 ECHO>>>EF >55%, nml systolic function, imparired LV relaxation RA/RV nml  1/3 TEE with cardioversion>>>SR briefly but then back to afib.  TEE limited study due to patient tolerance.   1/4 CT chest > No evidence of pulmonary emboli or thoracic aortic aneurysm. Moderate pericardial effusion with bilateral pleural effusions, small on the right and small to moderate on the left.   1/7: Left sided thoracocentesis without complication, remains in a-fib, asymptomatic supine hypertension orthostatic hypotension,  1/8: TEE cardioversion today. Diuresing well on lasix  Vital Signs: Temp:  [97.4 F (36.3 C)-98.5 F (36.9 C)] 97.9 F (36.6 C) (01/09 0920) Pulse Rate:  [81-84] 81  (01/09 0738) Resp:  [20-24] 22  (01/09 0920) BP: (131-181)/(46-95) 175/85 mmHg (01/09 0920) SpO2:  [94 %-100 %] 99 % (01/09 0920) Weight:  [213 lb 13.5 oz (97 kg)] 213 lb 13.5 oz (97 kg) (01/09 0500) I/O last 3 completed shifts: In: 960 [P.O.:960] Out: 3500 [Urine:3500]  Physical Examination: General: wdwn elderly male in NAD Neuro: AAOx4, speech clear, MAE CV: s1s2 irregularly irregular, gr2/6 SM  PULM: resp's even / non-labored at rest, lungs bilaterally diminished with faint posterior crackles lower UX:LKGMW, soft, bsx4 active, tol PO's Extremities: warm/dry, no edema  Labs     CBC    Component Value Date/Time   WBC 9.2 07/23/2011 0542   RBC 4.47 07/23/2011 0542   HGB 12.2* 07/23/2011 0542   HCT 38.4* 07/23/2011 0542   PLT 282 07/23/2011 0542   MCV 85.9 07/23/2011 0542   MCH 27.3 07/23/2011 0542   MCHC 31.8 07/23/2011 0542   RDW 13.5 07/23/2011 0542   LYMPHSABS 1.2 07/14/2011 1120   MONOABS 1.2* 07/14/2011 1120   EOSABS 0.0 07/14/2011 1120   BASOSABS 0.1 07/14/2011 1120    BMET    Component Value Date/Time   NA 139 07/23/2011 0542   K 4.4 07/23/2011 0542   CL 98 07/23/2011 0542   CO2 35* 07/23/2011 0542   GLUCOSE 107* 07/23/2011 0542   BUN 33* 07/23/2011 0542   CREATININE 1.34 07/23/2011 0542   CALCIUM 9.3 07/23/2011 0542   GFRNONAA 47* 07/23/2011 0542   GFRAA 54* 07/23/2011 0542    Radiology:  Assessment and Plan:  Dyspnea Assessment: Likely multifactorial w most significant contributor AS, new onset afib with variable rate up to 170's (now controlled).  No real history to support COPD (non-smoker, no occupational exposures etc, no changes on cxr c/w copd).   1/5 CT chest neg for PE, , mod pericardial effusion w/ bilateral left > right pleural effusions   Plan: - pleural fluid analysis looks like transudative from CHF but also has high ESR, AFB, culture, pending. Fungal stain neg. No malignant cells.  -PFT's to assess lung function -continue with diuresis  Chest pain /Atrial fibrillation with rapid ventricular response Assessment: Plan: -TEE DC cardioversion today per cardiology  [facility] [phone]  Seen and examined with Dr. Scot Dock, I agree with his note above.  PCCM will sign off, call if questions.  [name]

## 2011-07-23 NOTE — Progress Notes (Signed)
Echocardiogram 2D Echocardiogram has been performed.  Jeryl Columbia R 07/23/2011, 11:18 AM

## 2011-07-23 NOTE — Procedures (Signed)
Electrical Cardioversion Procedure Note Barry Thompson [id number] 10-01-1923  Procedure: Electrical Cardioversion Indications:  Atrial Fibrillation  Procedure Details Consent: Risks of procedure as well as the alternatives and risks of each were explained to the (patient/caregiver).  Consent for procedure obtained. Time Out: Verified patient identification, verified procedure, site/side was marked, verified correct patient position, special equipment/implants available, medications/allergies/relevent history reviewed, required imaging and test results available.  Performed  Patient placed on cardiac monitor, pulse oximetry, supplemental oxygen as necessary.  Sedation given: Benzodiazepines Pacer pads placed anterior and posterior chest.  Cardioverted 1 time(s).  Cardioverted at 200J.  Evaluation Findings: Post procedure EKG shows: NSR Complications: None Patient did tolerate procedure well.   Barry Thompson 07/23/2011, 10:56 AM

## 2011-07-23 NOTE — Progress Notes (Signed)
Clinical Social Worker met with family and friends who were in the room to discuss the SNF bed availability. Patient's wife chooses [facility] SNF if patient is able to be discharged tomorrow, but if patient is discharged Friday, [facility] would require a $194 bed hold fee and the family is not agreeable to that. Family gave CSW a second choice if discharge is Friday for SNF [facility]. CSW will continue to follow to facilitate discharge when medically ready.   Rozetta Nunnery MSW, [employer] [phone]

## 2011-07-23 NOTE — Progress Notes (Signed)
Discussed in the long length of stay meeting Genella Rife [facility] 07/23/2011

## 2011-07-23 NOTE — Progress Notes (Signed)
OT Note:  Pt out of room for test/procedure per RN.  Will continue to follow. 07/23/2011 Martie Round, OTR/L Pager: [phone]

## 2011-07-23 NOTE — Progress Notes (Signed)
Received call from nurse that patient has been brady'ing down even while awake to the 30's, asymptomatic, then coming back up into the 60s. Discussed with Dr. Shirlee Latch - will d/c Toprol for now and continue Amiodarone but at decreased dose of 200mg  bid.  Dayna Dunn PA-C

## 2011-07-24 ENCOUNTER — Encounter (HOSPITAL_COMMUNITY): Payer: Self-pay | Admitting: Nurse Practitioner

## 2011-07-24 DIAGNOSIS — I951 Orthostatic hypotension: Secondary | ICD-10-CM | POA: Insufficient documentation

## 2011-07-24 LAB — CBC
HCT: 38.6 % — ABNORMAL LOW (ref 39.0–52.0)
Hemoglobin: 12.3 g/dL — ABNORMAL LOW (ref 13.0–17.0)
MCH: 27.3 pg (ref 26.0–34.0)
MCHC: 31.9 g/dL (ref 30.0–36.0)
RBC: 4.5 MIL/uL (ref 4.22–5.81)

## 2011-07-24 LAB — BASIC METABOLIC PANEL
BUN: 43 mg/dL — ABNORMAL HIGH (ref 6–23)
CO2: 34 mEq/L — ABNORMAL HIGH (ref 19–32)
Chloride: 97 mEq/L (ref 96–112)
GFR calc non Af Amer: 42 mL/min — ABNORMAL LOW (ref >90)
Glucose, Bld: 95 mg/dL (ref 70–99)
Potassium: 4.5 mEq/L (ref 3.5–5.1)
Sodium: 138 mEq/L (ref 135–145)

## 2011-07-24 MED ORDER — LOSARTAN POTASSIUM 50 MG PO TABS: 50.0000 mg | ORAL_TABLET | Freq: Every day | ORAL | Status: DC

## 2011-07-24 MED ORDER — RIVAROXABAN 15 MG PO TABS: 15.0000 mg | ORAL_TABLET | Freq: Every day | ORAL | Status: DC

## 2011-07-24 MED ORDER — AMIODARONE HCL 200 MG PO TABS: 200.0000 mg | ORAL_TABLET | Freq: Two times a day (BID) | ORAL | Status: DC

## 2011-07-24 MED ORDER — FUROSEMIDE 40 MG PO TABS: ORAL_TABLET | ORAL | Status: DC

## 2011-07-24 MED ORDER — AMLODIPINE BESYLATE 2.5 MG PO TABS
2.5000 mg | ORAL_TABLET | Freq: Every day | ORAL | Status: DC
  Administered 2011-07-24: 2.5 mg via ORAL
  Filled 2011-07-24: qty 1

## 2011-07-24 MED ORDER — AMLODIPINE BESYLATE 2.5 MG PO TABS: 2.5000 mg | ORAL_TABLET | Freq: Every day | ORAL | Status: DC

## 2011-07-24 MED ORDER — ATORVASTATIN CALCIUM 20 MG PO TABS: 20.0000 mg | ORAL_TABLET | Freq: Every day | ORAL | Status: DC

## 2011-07-24 MED ORDER — IPRATROPIUM-ALBUTEROL 18-103 MCG/ACT IN AERO: 2.0000 | INHALATION_SPRAY | Freq: Four times a day (QID) | RESPIRATORY_TRACT | Status: DC | PRN

## 2011-07-24 NOTE — Progress Notes (Signed)
Sats 99% throughout night, d/c'd 2LNC and pt maintained sats > 95% on room air. Barry Thompson

## 2011-07-24 NOTE — Progress Notes (Signed)
Clinical Social Worker facilitated patient discharge to SNF, [facility] by contacting family and facility and arranging transport via ambulance.   Rozetta Nunnery MSW, [employer] [phone]

## 2011-07-24 NOTE — Progress Notes (Signed)
Patient ID: Barry Thompson, male   DOB: 1924-07-14, 78 y.o.   MRN: [id number]      SUBJECTIVE: Patient diuresed well again yesterday.  Converted to NSR with cardioversion and remains in NSR.  HR dipping to 40s at times but recovers quickly to 70s.       Marland Kitchen amiodarone  200 mg Oral BID  . antiseptic oral rinse  15 mL Mouth Rinse BID  . cloNIDine  0.1 mg Oral BID  . furosemide  40 mg Intravenous BID  . ipratropium  0.5 mg Nebulization Once  . levalbuterol  0.63 mg Nebulization Once  . losartan  50 mg Oral Daily  . mulitivitamin with minerals  1 tablet Oral Daily  . omega-3 acid ethyl esters  1 g Oral Daily  . patient's guide to using coumadin book   Does not apply Once  . rivaroxaban  15 mg Oral Daily  . rosuvastatin  10 mg Oral q1800  . sodium chloride  3 mL Intravenous Q12H  . sodium chloride  3 mL Intravenous Q12H  . DISCONTD: amiodarone  400 mg Oral BID  . DISCONTD: fentaNYL  250 mcg Intravenous Once  . DISCONTD: fentaNYL  250 mcg Intravenous Once  . DISCONTD: metoprolol succinate  25 mg Oral Daily  . DISCONTD: midazolam  10 mg Intravenous Once  . DISCONTD: midazolam  10 mg Intravenous Once  . DISCONTD: sodium chloride  3 mL Intravenous Q12H  . DISCONTD: sodium chloride  3 mL Intravenous Q12H  . DISCONTD: sodium chloride  3 mL Intravenous Q12H  . DISCONTD: sodium chloride  3 mL Intravenous Q12H      Filed Vitals:   07/24/11 0004 07/24/11 0419 07/24/11 0500 07/24/11 0744  BP:  133/65  163/72  Pulse:    76  Temp: 98.7 F (37.1 C) 97.6 F (36.4 C)  97.6 F (36.4 C)  TempSrc: Oral Oral  Oral  Resp:  18    Height:   6\' 1"  (1.854 m)   Weight:   98.4 kg (216 lb 14.9 oz)   SpO2:  95%  95%    Intake/Output Summary (Last 24 hours) at 07/24/11 0803 Last data filed at 07/24/11 0500  Gross per 24 hour  Intake   1014 ml  Output   2085 ml  Net  -1071 ml    LABS: Basic Metabolic Panel:  Basename 07/23/11 0542 07/22/11 0550  NA 139 138  K 4.4 4.8  CL 98 99  CO2 35* 32   GLUCOSE 107* 114*  BUN 33* 31*  CREATININE 1.34 1.34  CALCIUM 9.3 9.4  MG -- --  PHOS -- --   Liver Function Tests:  Basename 07/22/11 0550  AST --  ALT --  ALKPHOS --  BILITOT --  PROT 6.5  ALBUMIN --   No results found for this basename: LIPASE:2,AMYLASE:2 in the last 72 hours CBC:  Basename 07/23/11 0542 07/22/11 0550  WBC 9.2 9.5  NEUTROABS -- --  HGB 12.2* 12.4*  HCT 38.4* 38.7*  MCV 85.9 85.8  PLT 282 260   ESR 70  Pleural fluid/serum LDH ratio = 0.51 Pleural fluid/serum total protein ratio = 0.47  RADIOLOGY:  Ct Angio Chest W/cm &/or Wo Cm  07/18/2011  *RADIOLOGY REPORT*  Clinical Data:  78 year old male with chest pain and shortness of breath.  CT ANGIOGRAPHY CHEST WITH CONTRAST  Technique:  Multidetector CT imaging of the chest was performed using the standard protocol during bolus administration of intravenous contrast.  Multiplanar CT  image reconstructions including MIPs were obtained to evaluate the vascular anatomy.  Contrast: OMNIPAQUE IOHEXOL 350 MG/ML IV SOLN  Comparison:  07/14/2011 chest radiograph  Findings:  This is a technically satisfactory study.  No pulmonary emboli are identified. There is no evidence of thoracic aortic aneurysm.  Mild cardiomegaly and moderate coronary artery calcifications are identified.  A moderate pericardial effusion is noted. Bilateral pleural effusions are identified, small on the right and small to moderate on the left. No enlarged lymph nodes are identified.  Moderate bilateral lower lung atelectasis identified. There is no discrete mass or airspace disease present. No endobronchial or endotracheal lesions are identified.  No acute or suspicious bony abnormalities are noted. The visualized upper abdomen is unremarkable.  Review of the MIP images confirms the above findings.  IMPRESSION: No evidence of pulmonary emboli or thoracic aortic aneurysm.  Moderate pericardial effusion with bilateral pleural effusions, small on the  right and small to moderate on the left.  Associated bilateral lower lung atelectasis.  Cardiomegaly and coronary artery disease.  Original Report Authenticated By: Rosendo Gros, M.D.    PHYSICAL EXAM General: NAD Neck: JVP 8 cm, no thyromegaly or thyroid nodule.  Lungs: Decreased breath sounds at bases, L>R CV: Nondisplaced PMI.  Heart irregular S1/S2, no S3/S4, 3/6 systolic m RUSB.  No peripheral edema.  No carotid bruit.  Normal pedal pulses.  Abdomen: Soft, nontender, no hepatosplenomegaly, no distention.  Neurologic: Alert and oriented x 3.  Psych: Normal affect. Extremities: No clubbing or cyanosis.   TELEMETRY: Reviewed telemetry pt in sinus rhythm, rate 70s.  Occasionally dropped into 40s with sleep.   ASSESSMENT AND PLAN:  78 yo with moderate-severe AS and diastolic CHF admitted with atrial fibrillation/RVR and CHF exacerbation.  He had TEE-guided cardioversion about a week ago but failed to hold NSR so has been started on amiodarone.  Also with L>R pleural effusions.  He has had left thoracentesis and repeat DCCV yesterday. 1. Aortic stenosis: Per Dr. Eden Emms, not candidate for open AVR and patient has not wanted evaluation for TAVR.  2. CHF: Acute on chronic diastolic CHF.  Likely triggered by atrial fibrillation in the face of AS.  He will tolerate afib poorly, needs to maintain NSR.  He has bilateral pleural effusions likely due to CHF.  He is diuresing well.  - Will convert to Lasix 40 mg po bid at discharge.  - Await BMET today.  3. Atrial fibrillation: On amiodarone.  Cardioverted to NSR.  Continue Xarelto (renally dosed).  4. Pleural effusions: Status post thoracentesis.  Effusion is transudative by Light's criteria (pleural/serum LDH 0.51, pleural/serum total protein 0.47).  This would suggest CHF-related.  However, interestingly his ESR was quite high which would tend to suggest an inflammatory process (also with both pericardial and pleural effusions).  5. Renal: Follow  creatinine with diuresis, stable so far.  6. HTN: BP running rather high at times.  He denies orthostatic symptoms.  Will add low dose amlodipine.   7. Will discharge to short term rehab today.  - Followup Dr. Eden Emms WITHIN 1 WEEK. - BMET in 1 week - Meds: amiodarone 200 mg bid, clonidine 0.1 mg bid, Lasix 40 mg bid x 1 week then 40 mg daily, combivent qid prn, losartan 50 mg daily, Xarelto 15 mg daily, atorvastatin 20 mg daily, amlodipine 2.5 mg daily.   [name] 07/24/2011 8:03 AM

## 2011-07-24 NOTE — Discharge Summary (Signed)
Patient ID: Barry Thompson,  MRN: [id number], DOB/AGE: 10-25-1923 78 y.o.  Admit date: 07/14/2011 Discharge date: 07/24/2011  Primary Care Provider: A. Green Primary Cardiologist: P. Nishan  Discharge Diagnoses Principal Problem:  *Atrial fibrillation with rapid ventricular response Active Problems:  HTN (hypertension)  Mitral valve disease  Aortic stenosis, severe  Mixed hyperlipidemia  First degree atrioventricular block  Dyspnea  Chest pain  Pericardial effusion  Renal insufficiency  Pleural effusion  Orthostatic hypotension   Allergies No Known Allergies  Procedures 07/20/2011 2D Echocardiogram with contrast  Study Conclusions  - Left ventricle: The study is technically very limited. EF   seems to be in the 55% range. The cavity size was normal.   Wall thickness was increased in a pattern of moderate to   severe LVH. Images were inadequate for LV wall motion   assessment. - Aortic valve: There is moderately severe AS with   calcification of leaflets. Mild regurgitation. Mean   gradient: 35mm Hg (S). Peak gradient: 61mm Hg (S). Valve   area: 0.98cm^2(VTI). Valve area: 0.91cm^2 (Vmax). Valve   area: 0.93cm^2 (Vmean). - Right atrium: The atrium was mildly to moderately dilated. - Pulmonary arteries: PA peak pressure: 45mm Hg (S). - Pericardium, extracardiac: There is a pericardial effusion   that is difficult to fully assess. I doubt it is large. _______________________________________________________  07/18/2011 CTA chest  IMPRESSION: No evidence of pulmonary emboli or thoracic aortic aneurysm.  Moderate pericardial effusion with bilateral pleural effusions, small on the right and small to moderate on the left.  Associated bilateral lower lung atelectasis.  Cardiomegaly and coronary artery disease. ________________________________________________________  07/21/2011 Left Thoracentesis  Findings 950 ml of amber pleural fluid was obtained. A sample was sent to  Pathology for cytogenetics, flow, and cell counts, as well as for infection analysis _________________________________________________  History of Present Illness  78 y/o male with the above problem list who presented to the ED on 12/31 with complaints of progressive dyspnea.  He was found to be in a.fib with RVR and had mild volume overload.  His troponin was NL.  He was placed on IV lasix and Diltiazem and admitted for further evaluation.  Hospital Course   Following admission, we were able to achieve reasonable rate control with IV diltiazem.  2D echo was initially performed on 07/17/2011 revealing NL LV function but moderately severe Aortic stenosis.  Despite diuresis, pts dyspnea did not significantly improve and it was felt that restoring sinus rhythm would be crucial.  Pt was placed on Rivaroxaban therapy (adjusted for renal insufficiency) and after 3 doses, underwent TEE and DCCV on Jul 17, 2011.  Unfortunately, pt was unable to maintain sinus rhythm and he was therefor placed on Amiodarone therapy.    As his dyspnea continued, pulmonology was consulted and pt underwent CTA chest on 1/4 revealing no pulmonary emboli however pt was noted to have a pericardial effusion along with l > r pleural effusions.  Pts sed rate was elevated and there was concern for an acute inflammatory process accounting for these effusions.  Repeat echo was performed on 1/6 and showed no evidence of tamponade physiology and pericardial effusion was not felt to be large.  With regard to pleural effusions, decision was made to perform diagnostic/therapeutic thoracentesis on 1/7 with 974ml's drawn off.  This was found to be transudative and felt to be 2/2 diast chf in setting of afib and moderately severe AS.  Diuresis continued and attention was focused on restoring sinus rhythm.  Pt underwent  repeat DCCV on Jan 9th and this time, in the setting of amiodarone therapy, pt successfully converted to sinus rhythm.  He has since  maintained sinus rhythm and is felt to be stable for discharge to rehab today.  He will f/u next week in our office at which time we will re-eval a bmet.  Discharge Vitals:  Blood pressure 163/72, pulse 76, temperature 97.6 F (36.4 C), temperature source Oral, resp. rate 18, height 6\' 1"  (1.854 m), weight 216 lb 14.9 oz (98.4 kg), SpO2 95.00%.   Labs: CBC:  Basename 07/24/11 0843 07/23/11 0542  WBC 9.4 9.2  NEUTROABS -- --  HGB 12.3* 12.2*  HCT 38.6* 38.4*  MCV 85.8 85.9  PLT 283 282   Basic Metabolic Panel:  Basename 07/23/11 0542 07/22/11 0550  NA 139 138  K 4.4 4.8  CL 98 99  CO2 35* 32  GLUCOSE 107* 114*  BUN 33* 31*  CREATININE 1.34 1.34  CALCIUM 9.3 9.4  MG -- --  PHOS -- --   Liver Function Tests:  Basename 07/22/11 0550  AST --  ALT --  ALKPHOS --  BILITOT --  PROT 6.5  ALBUMIN --   Lab Results  Component Value Date   CKTOTAL 217 07/15/2011   CKMB 3.5 07/15/2011   TROPONINI <0.30 07/15/2011   Lab Results  Component Value Date   TSH 3.545 07/14/2011   Lab Results  Component Value Date   CHOL 101 07/19/2011   HDL 51 07/15/2011   LDLCALC 66 07/15/2011   TRIG 69 07/15/2011   CHOLHDL 2.6 07/15/2011    Disposition:  Follow-up Information    Follow up with GREEN, [name], MD. (as scheduled)       Follow up with [name], MD on 07/30/2011. (9:30;  We will also check a blood chemistry that day)    Contact information:   [address] [place] [address] [phone]          Discharge Medications: Current Discharge Medication List    START taking these medications   Details  albuterol-ipratropium (COMBIVENT) 18-103 MCG/ACT inhaler Inhale 2 puffs into the lungs every 6 (six) hours as needed for wheezing. Qty: 1 Inhaler, Refills: 3    amiodarone (PACERONE) 200 MG tablet Take 1 tablet (200 mg total) by mouth 2 (two) times daily. Qty: 30 tablet, Refills: 6    amLODipine (NORVASC) 2.5 MG tablet  Take 1 tablet (2.5 mg total) by mouth daily. Qty: 30 tablet, Refills: 6    atorvastatin (LIPITOR) 20 MG tablet Take 1 tablet (20 mg total) by mouth daily. Qty: 30 tablet, Refills: 6    furosemide (LASIX) 40 MG tablet 1 tab bid x 1 week then 1 tab daily Qty: 30 tablet, Refills: 6    losartan (COZAAR) 50 MG tablet Take 1 tablet (50 mg total) by mouth daily. Qty: 30 tablet, Refills: 6    Rivaroxaban (XARELTO) 15 MG TABS tablet Take 1 tablet (15 mg total) by mouth daily. Qty: 30 tablet, Refills: 6      CONTINUE these medications which have NOT CHANGED   Details  cloNIDine (CATAPRES) 0.1 MG tablet Take 0.1 mg by mouth 2 (two) times daily.      Fish Oil OIL 1 tablet by Does not apply route daily.     Multiple Vitamin (MULTIVITAMIN) capsule Take 1 capsule by mouth daily.        STOP taking these medications     aspirin  81 MG tablet      losartan-hydrochlorothiazide (HYZAAR) 100-12.5 MG per tablet      simvastatin (ZOCOR) 40 MG tablet         Outstanding Labs/Studies  F/u bmet  Duration of Discharge Encounter: Greater than 30 minutes including physician time.  Signed, Nicolasa Ducking NP 07/24/2011, 10:00 AM

## 2011-07-30 ENCOUNTER — Ambulatory Visit (INDEPENDENT_AMBULATORY_CARE_PROVIDER_SITE_OTHER): Payer: Medicare Other | Admitting: Cardiovascular Disease

## 2011-07-30 ENCOUNTER — Other Ambulatory Visit: Payer: Medicare Other | Admitting: *Deleted

## 2011-07-30 ENCOUNTER — Ambulatory Visit
Admission: RE | Admit: 2011-07-30 | Discharge: 2011-07-30 | Disposition: A | Discharge status: Home / Self Care | Payer: Medicare Other | Source: Ambulatory Visit | Attending: Cardiovascular Disease | Admitting: Cardiovascular Disease

## 2011-07-30 ENCOUNTER — Encounter: Payer: Self-pay | Admitting: Cardiovascular Disease

## 2011-07-30 DIAGNOSIS — R0602 Shortness of breath: Secondary | ICD-10-CM

## 2011-07-30 DIAGNOSIS — Z79899 Other long term (current) drug therapy: Secondary | ICD-10-CM

## 2011-07-30 DIAGNOSIS — I313 Pericardial effusion (noninflammatory): Secondary | ICD-10-CM

## 2011-07-30 DIAGNOSIS — I4891 Unspecified atrial fibrillation: Secondary | ICD-10-CM

## 2011-07-30 DIAGNOSIS — I319 Disease of pericardium, unspecified: Secondary | ICD-10-CM

## 2011-07-30 DIAGNOSIS — I509 Heart failure, unspecified: Secondary | ICD-10-CM

## 2011-07-30 DIAGNOSIS — J90 Pleural effusion, not elsewhere classified: Secondary | ICD-10-CM

## 2011-07-30 LAB — BASIC METABOLIC PANEL
BUN: 16 mg/dL (ref 6–23)
CO2: 31 mEq/L (ref 19–32)
Calcium: 8.6 mg/dL (ref 8.4–10.5)
GFR: 64.85 mL/min (ref >60.00)
Glucose, Bld: 92 mg/dL (ref 70–99)
Sodium: 138 mEq/L (ref 135–145)

## 2011-07-30 NOTE — Progress Notes (Signed)
78 yo long recent hospitalization for dyspnea.  PAF.  Had two TEE [facility] and amiodarone but failed to maintain SR now persistant afib with anticoagulation and rate control.  Has had severe AS.  Last TEE only showed mean gradient of 30 and peak of 44.  Not an AVR candidate and has not wanted TAVR.  Possible pleuropericarditis in hospital.  Had 900cc throacentesis by Dr Sung Amabile.  Needs F/U echo for pericardial effusion no tamponade in hospital.  Since D/C has been improved with less dyspnea.  No palpitations and does not know when he is in afib.  No bleeding problems.  D/C to rehab and making progress.    ROS: Denies fever, malais, weight loss, blurry vision, decreased visual acuity, cough, sputum, SOB, hemoptysis, pleuritic pain, palpitaitons, heartburn, abdominal pain, melena, lower extremity edema, claudication, or rash.  All other systems reviewed and negative  General: Affect appropriate Elderly chronically ill HEENT: normal Neck supple with no adenopathy JVP normal no bruits no thyromegaly Lungs clear with no wheezing and good diaphragmatic motion Decreased BS left lung Heart:  S1/S2 AS  murmur,rub, gallop or click PMI normal Abdomen: benighn, BS positve, no tenderness, no AAA no bruit.  No HSM or HJR Distal pulses intact with no bruits No edema Neuro non-focal Skin warm and dry No muscular weakness   Current Outpatient Prescriptions  Medication Sig Dispense Refill  . albuterol-ipratropium (COMBIVENT) 18-103 MCG/ACT inhaler Inhale 2 puffs into the lungs every 6 (six) hours as needed for wheezing.  1 Inhaler  3  . amiodarone (PACERONE) 200 MG tablet Take 1 tablet (200 mg total) by mouth 2 (two) times daily.  30 tablet  6  . amLODipine (NORVASC) 2.5 MG tablet Take 1 tablet (2.5 mg total) by mouth daily.  30 tablet  6  . atorvastatin (LIPITOR) 20 MG tablet Take 1 tablet (20 mg total) by mouth daily.  30 tablet  6  . cloNIDine (CATAPRES) 0.1 MG tablet Take 0.1 mg by mouth 2 (two) times  daily.        . Fish Oil OIL 1 tablet by Does not apply route daily.       . furosemide (LASIX) 40 MG tablet 1 tab bid x 1 week then 1 tab daily  30 tablet  6  . losartan (COZAAR) 50 MG tablet Take 1 tablet (50 mg total) by mouth daily.  30 tablet  6  . Multiple Vitamin (MULTIVITAMIN) capsule Take 1 capsule by mouth daily.        . Rivaroxaban (XARELTO) 15 MG TABS tablet Take 1 tablet (15 mg total) by mouth daily.  30 tablet  6    Allergies  Review of patient's allergies indicates no known allergies.  Electrocardiogram:  Assessment and Plan

## 2011-07-30 NOTE — Assessment & Plan Note (Signed)
Not critical and TEE suggests more moderate Not a candidate for AVR

## 2011-07-30 NOTE — Assessment & Plan Note (Signed)
F/U CXR today with lateral decubitus

## 2011-07-30 NOTE — Assessment & Plan Note (Signed)
Rate control is good and on xarelto  Monitor renal function to see if dose should be decreased to 15 mg

## 2011-07-30 NOTE — Assessment & Plan Note (Signed)
Improved F/U CXR with lateral decubitus for pleural effusion  Echo to R/O increasing effu

## 2011-07-30 NOTE — Patient Instructions (Signed)
Your physician recommends that you schedule a follow-up appointment in: 6-8 WEEKS WITH DR Willough [facility] Your physician recommends that you continue on your current medications as directed. Please refer to the Current Medication list given to you today. Your physician recommends that you return for lab work in: TODAY BMET BNP  DX V58.69 A chest x-ray takes a picture of the organs and structures inside the chest, including the heart, lungs, and blood vessels. This test can show several things, including, whether the heart is enlarges; whether fluid is building up in the lungs; and whether pacemaker / defibrillator leads are still in place. DX F/U PE   PA LA  LEFT LAT DECUBITIS Your physician has requested that you have an echocardiogram. Echocardiography is a painless test that uses sound waves to create images of your heart. It provides your doctor with information about the size and shape of your heart and how well your heart's chambers and valves are working. This procedure takes approximately one hour. There are no restrictions for this procedure. DX F/U  PE

## 2011-07-31 LAB — MISCELLANEOUS TEST

## 2011-08-04 ENCOUNTER — Ambulatory Visit (HOSPITAL_COMMUNITY): Payer: Medicare Other | Attending: Cardiology | Admitting: Radiology

## 2011-08-04 DIAGNOSIS — I319 Disease of pericardium, unspecified: Secondary | ICD-10-CM

## 2011-08-04 DIAGNOSIS — R0602 Shortness of breath: Secondary | ICD-10-CM

## 2011-08-04 DIAGNOSIS — Z79899 Other long term (current) drug therapy: Secondary | ICD-10-CM

## 2011-08-04 DIAGNOSIS — I10 Essential (primary) hypertension: Secondary | ICD-10-CM | POA: Insufficient documentation

## 2011-08-04 DIAGNOSIS — E785 Hyperlipidemia, unspecified: Secondary | ICD-10-CM | POA: Insufficient documentation

## 2011-08-04 DIAGNOSIS — I4891 Unspecified atrial fibrillation: Secondary | ICD-10-CM | POA: Insufficient documentation

## 2011-08-04 DIAGNOSIS — I313 Pericardial effusion (noninflammatory): Secondary | ICD-10-CM

## 2011-08-04 DIAGNOSIS — R0989 Other specified symptoms and signs involving the circulatory and respiratory systems: Secondary | ICD-10-CM | POA: Insufficient documentation

## 2011-08-04 DIAGNOSIS — I509 Heart failure, unspecified: Secondary | ICD-10-CM | POA: Insufficient documentation

## 2011-08-04 DIAGNOSIS — R0609 Other forms of dyspnea: Secondary | ICD-10-CM | POA: Insufficient documentation

## 2011-08-06 ENCOUNTER — Telehealth: Payer: Self-pay | Admitting: Cardiovascular Disease

## 2011-08-06 DIAGNOSIS — J90 Pleural effusion, not elsewhere classified: Secondary | ICD-10-CM

## 2011-08-06 NOTE — Telephone Encounter (Signed)
NEED TO SCHEDULE F/U CXR  AT [facility]  PT WILL NO LONGER BE IN FACILITY AT THAT TIME .[name]

## 2011-08-06 NOTE — Telephone Encounter (Signed)
Fu call Thayer Ohm from [employer] you back

## 2011-09-05 LAB — AFB CULTURE WITH SMEAR (NOT AT ARMC)

## 2011-09-10 ENCOUNTER — Ambulatory Visit (INDEPENDENT_AMBULATORY_CARE_PROVIDER_SITE_OTHER): Payer: Medicare Other | Admitting: Cardiovascular Disease

## 2011-09-10 ENCOUNTER — Encounter: Payer: Self-pay | Admitting: Cardiovascular Disease

## 2011-09-10 DIAGNOSIS — I313 Pericardial effusion (noninflammatory): Secondary | ICD-10-CM

## 2011-09-10 DIAGNOSIS — I350 Nonrheumatic aortic (valve) stenosis: Secondary | ICD-10-CM

## 2011-09-10 DIAGNOSIS — I359 Nonrheumatic aortic valve disorder, unspecified: Secondary | ICD-10-CM

## 2011-09-10 DIAGNOSIS — I4891 Unspecified atrial fibrillation: Secondary | ICD-10-CM

## 2011-09-10 DIAGNOSIS — I319 Disease of pericardium, unspecified: Secondary | ICD-10-CM

## 2011-09-10 DIAGNOSIS — E782 Mixed hyperlipidemia: Secondary | ICD-10-CM

## 2011-09-10 DIAGNOSIS — I10 Essential (primary) hypertension: Secondary | ICD-10-CM

## 2011-09-10 NOTE — Progress Notes (Signed)
78 yo long recent hospitalization for dyspnea. PAF. Had two TEE [facility] and amiodarone but failed to maintain SR now persistant afib with anticoagulation and rate control. Has had severe AS. Last TEE only showed mean gradient of 30 and peak of 44. Not an AVR candidate and has not wanted TAVR. Possible pleuropericarditis in hospital. Had 900cc throacentesis by Dr Sung Amabile.  Since D/C has been improved with less dyspnea. No palpitations and does not know when he is in afib. No bleeding problems. D/C to rehab and making progress.  I believe he is in NSR today.  Feels great with no edema.  Still has dementia and severe lung disease.  However it is clear that his acute decompensation was from afib and CHF form AS.    He looks so good today I readressed the issue of TAVR.  Son in room as well.  May consider CTA to see if he is a candidate for this.  Re-evaluate in 6-8 Weeks.  Echo 08/04/11 Study Conclusions  - Left ventricle: The cavity size was mildly dilated. Wall thickness was increased in a pattern of moderate to severe LVH. Systolic function was normal. The estimated ejection fraction was in the range of 60% to 65%. Wall motion was normal; there were no regional wall motion abnormalities. - Left atrium: The atrium was mildly dilated. - Pericardium, extracardiac: A small to moderate pericardial effusion was identified. Impressions:  - Limited study to fu pericardial effusion; full doppler not performed. Small to moderate pericardial effusion; no change in size compared to 07/20/11.  ROS: Denies fever, malais, weight loss, blurry vision, decreased visual acuity, cough, sputum, SOB, hemoptysis, pleuritic pain, palpitaitons, heartburn, abdominal pain, melena, lower extremity edema, claudication, or rash.  All other systems reviewed and negative  General: Affect appropriate Chronically ill with poor memory HEENT: normal Neck supple with no adenopathy JVP normal no bruits no thyromegaly Lungs clear  with no wheezing and good diaphragmatic motion Heart:  S1/S2 muffled with severe AS murmur, no rub, gallop or click PMI normal Abdomen: benighn, BS positve, no tenderness, no AAA no bruit.  No HSM or HJR Distal pulses intact with no bruits No edema Neuro non-focal Skin warm and dry No muscular weakness   Current Outpatient Prescriptions  Medication Sig Dispense Refill  . albuterol-ipratropium (COMBIVENT) 18-103 MCG/ACT inhaler Inhale 2 puffs into the lungs every 6 (six) hours as needed for wheezing.  1 Inhaler  3  . amiodarone (PACERONE) 200 MG tablet Take 1 tablet (200 mg total) by mouth 2 (two) times daily.  30 tablet  6  . atorvastatin (LIPITOR) 20 MG tablet Take 1 tablet (20 mg total) by mouth daily.  30 tablet  6  . cloNIDine (CATAPRES) 0.1 MG tablet Take 0.1 mg by mouth 2 (two) times daily.        Marland Kitchen docusate sodium (COLACE) 100 MG capsule Take 100 mg by mouth daily.      . Fish Oil OIL 1 tablet by Does not apply route daily.       . furosemide (LASIX) 40 MG tablet 1 tab daily      . labetalol (NORMODYNE) 100 MG tablet Take 100 mg by mouth 2 (two) times daily.      Marland Kitchen losartan-hydrochlorothiazide (HYZAAR) 100-25 MG per tablet Take 1 tablet by mouth daily.      . Multiple Vitamin (MULTIVITAMIN) capsule Take 1 capsule by mouth daily.        . Rivaroxaban (XARELTO) 15 MG TABS tablet Take 1 tablet (  15 mg total) by mouth daily.  30 tablet  6    Allergies  Review of patient's allergies indicates no known allergies.  Electrocardiogram:  SR rate 54 PR 252 LVH inferior T wave changes  Assessment and Plan

## 2011-09-10 NOTE — Assessment & Plan Note (Signed)
Pleuropericarditis.  Effusion stable by recent echo and S/P thoracentesis.  Consdier F/U echo and CXR in 6-8 weeks

## 2011-09-10 NOTE — Patient Instructions (Signed)
Your physician recommends that you schedule a follow-up appointment in: 6-8 WEEKS WITH DR NISHAN  Your physician recommends that you continue on your current medications as directed. Please refer to the Current Medication list given to you today.  

## 2011-09-10 NOTE — Assessment & Plan Note (Signed)
Recent CHF exacerbation.  Not a candidate for open surgery due to age, dementia and COPD.  Consider CTA in 6-8 weeks to see if he would be a candidate for TAVR.  Likely to have recurrent CHF with severe AS and PAF.  At some point only other option would be palliative care

## 2011-09-10 NOTE — Assessment & Plan Note (Signed)
NSR today Continue amiodarone.  Important to maintain as much NSR as possible to limit CHF

## 2011-09-10 NOTE — Assessment & Plan Note (Signed)
Cholesterol is at goal.  Continue current dose of statin and diet Rx.  No myalgias or side effects.  F/U  LFT's in 6 months. Lab Results  Component Value Date   LDLCALC 66 07/15/2011

## 2011-09-10 NOTE — Assessment & Plan Note (Signed)
Well controlled.  Continue current medications and low sodium Dash type diet.    

## 2011-09-23 ENCOUNTER — Other Ambulatory Visit: Payer: Self-pay | Admitting: Cardiovascular Disease

## 2011-09-23 NOTE — Telephone Encounter (Signed)
PT'S WIFE AWARE FOR PT TO HAVE REPEAT CXR THIS WEEK  WILL HAVE DONE AT [facility] OFFICE .[name]

## 2011-09-24 ENCOUNTER — Ambulatory Visit (HOSPITAL_COMMUNITY)
Admission: RE | Admit: 2011-09-24 | Discharge: 2011-09-24 | Disposition: A | Discharge status: Home / Self Care | Payer: Medicare Other | Source: Ambulatory Visit | Attending: Cardiovascular Disease | Admitting: Cardiovascular Disease

## 2011-09-24 DIAGNOSIS — J90 Pleural effusion, not elsewhere classified: Secondary | ICD-10-CM

## 2011-09-24 DIAGNOSIS — F411 Generalized anxiety disorder: Secondary | ICD-10-CM

## 2011-09-24 DIAGNOSIS — E039 Hypothyroidism, unspecified: Secondary | ICD-10-CM

## 2011-09-24 HISTORY — DX: Generalized anxiety disorder: F41.1

## 2011-09-24 HISTORY — DX: Hypothyroidism, unspecified: E03.9

## 2011-10-22 ENCOUNTER — Encounter: Payer: Self-pay | Admitting: *Deleted

## 2011-10-22 ENCOUNTER — Encounter: Payer: Self-pay | Admitting: Cardiovascular Disease

## 2011-10-22 ENCOUNTER — Ambulatory Visit (INDEPENDENT_AMBULATORY_CARE_PROVIDER_SITE_OTHER): Payer: Medicare Other | Admitting: Cardiovascular Disease

## 2011-10-22 VITALS — BP 224/85 | HR 45 | Wt 217.0 lb

## 2011-10-22 DIAGNOSIS — I10 Essential (primary) hypertension: Secondary | ICD-10-CM

## 2011-10-22 DIAGNOSIS — I359 Nonrheumatic aortic valve disorder, unspecified: Secondary | ICD-10-CM

## 2011-10-22 DIAGNOSIS — R0600 Dyspnea, unspecified: Secondary | ICD-10-CM

## 2011-10-22 DIAGNOSIS — R0989 Other specified symptoms and signs involving the circulatory and respiratory systems: Secondary | ICD-10-CM

## 2011-10-22 DIAGNOSIS — E782 Mixed hyperlipidemia: Secondary | ICD-10-CM

## 2011-10-22 DIAGNOSIS — Z79899 Other long term (current) drug therapy: Secondary | ICD-10-CM

## 2011-10-22 DIAGNOSIS — I350 Nonrheumatic aortic (valve) stenosis: Secondary | ICD-10-CM

## 2011-10-22 LAB — BASIC METABOLIC PANEL
BUN: 24 mg/dL — ABNORMAL HIGH (ref 6–23)
CO2: 34 mEq/L — ABNORMAL HIGH (ref 19–32)
Chloride: 98 mEq/L (ref 96–112)
Creatinine, Ser: 1.2 mg/dL (ref 0.4–1.5)
Potassium: 3.6 mEq/L (ref 3.5–5.1)

## 2011-10-22 NOTE — Patient Instructions (Addendum)
Your physician recommends that you schedule a follow-up appointment in: 6-8 WEEKS WITH DR Excell Seltzer FOR AS Your physician recommends that you continue on your current medications as directed. Please refer to the Current Medication list given to you today. CARDIAC CT

## 2011-10-22 NOTE — Assessment & Plan Note (Signed)
Severe COPD.  Improved.  Left pleural effusion gone.  Severity of long disease and age preclude open AVR

## 2011-10-22 NOTE — Assessment & Plan Note (Signed)
Well controlled.  Continue current medications and low sodium Dash type diet.    

## 2011-10-22 NOTE — Progress Notes (Signed)
Patient ID: Barry Thompson, male   DOB: Aug 21, 1923, 78 y.o.   MRN: [id number] 78 yo long recent hospitalization for dyspnea. PAF. Had two TEE [facility] and amiodarone but failed to maintain SR now persistant afib with anticoagulation and rate control. Has had severe AS. Last TEE only showed mean gradient of 30 and peak of 44. Not an AVR candidate and has not wanted TAVR. Possible pleuropericarditis in hospital. Had 900cc throacentesis by Dr Sung Amabile. Since D/C has been improved with less dyspnea. No palpitations and does not know when he is in afib. No bleeding problems. D/C to rehab and making progress. I believe he is in NSR today. Feels great with no edema. Still has dementia and severe lung disease. However it is clear that his acute decompensation was from afib and CHF form AS.  He is more active and riding a bike qod.  Told him maintaining his functional status if very important  He looks so good today I readressed the issue of TAVR. Son in room as well. Will do CTA and have Dr Excell Seltzer see him for more info on TAVR  Echo 08/04/11  Study Conclusions  - Left ventricle: The cavity size was mildly dilated. Wall thickness was increased in a pattern of moderate to severe LVH. Systolic function was normal. The estimated ejection fraction was in the range of 60% to 65%. Wall motion was normal; there were no regional wall motion abnormalities. - Left atrium: The atrium was mildly dilated. - Pericardium, extracardiac: A small to moderate pericardial effusion was identified. Impressions:  - Limited study to fu pericardial effusion; full doppler not performed. Small to moderate pericardial effusion; no change in size compared to 07/20/11.  ROS: Denies fever, malais, weight loss, blurry vision, decreased visual acuity, cough, sputum, SOB, hemoptysis, pleuritic pain, palpitaitons, heartburn, abdominal pain, melena, lower extremity edema, claudication, or rash.  All other systems reviewed and  negative  General: Affect appropriate Elderly COPDer HEENT: normal Neck supple with no adenopathy JVP normal no bruits no thyromegaly Lungs clear with no wheezing and good diaphragmatic motion Heart:  S1/S2 diminished AS  murmur, no rub, gallop or click PMI normal Abdomen: benighn, BS positve, no tenderness, no AAA no bruit.  No HSM or HJR Distal pulses intact with no bruits No edema Neuro non-focal Skin warm and dry No muscular weakness   Current Outpatient Prescriptions  Medication Sig Dispense Refill  . albuterol-ipratropium (COMBIVENT) 18-103 MCG/ACT inhaler Inhale 2 puffs into the lungs every 6 (six) hours as needed for wheezing.  1 Inhaler  3  . ALPRAZolam (XANAX) 0.25 MG tablet Take 0.25 mg by mouth 3 (three) times daily as needed.      Marland Kitchen amiodarone (PACERONE) 200 MG tablet Take 1 tablet (200 mg total) by mouth 2 (two) times daily.  30 tablet  6  . atorvastatin (LIPITOR) 20 MG tablet Take 1 tablet (20 mg total) by mouth daily.  30 tablet  6  . cloNIDine (CATAPRES) 0.1 MG tablet Take 0.1 mg by mouth. 2 TABS TWICE DAILY      . docusate sodium (COLACE) 100 MG capsule Take 100 mg by mouth daily.      . Fish Oil OIL 1 tablet by Does not apply route daily.       . furosemide (LASIX) 40 MG tablet 1 tab daily      . labetalol (NORMODYNE) 100 MG tablet Take 100 mg by mouth 2 (two) times daily.      Marland Kitchen levothyroxine (SYNTHROID, LEVOTHROID) 25  MCG tablet Take 25 mcg by mouth daily.      Marland Kitchen losartan-hydrochlorothiazide (HYZAAR) 100-12.5 MG per tablet Take 1 tablet by mouth daily.      . Multiple Vitamin (MULTIVITAMIN) capsule Take 1 capsule by mouth daily.        . Rivaroxaban (XARELTO) 15 MG TABS tablet Take 1 tablet (15 mg total) by mouth daily.  30 tablet  6    Allergies  Review of patient's allergies indicates no known allergies.  Electrocardiogram:  Assessment and Plan

## 2011-10-22 NOTE — Assessment & Plan Note (Signed)
Cholesterol is at goal.  Continue current dose of statin and diet Rx.  No myalgias or side effects.  F/U  LFT's in 6 months. Lab Results  Component Value Date   LDLCALC 66 07/15/2011

## 2011-10-22 NOTE — Assessment & Plan Note (Signed)
Patient interested in understanding more about TAVR.  Will have Dr Excell Seltzer see.  Cr normal and in NSR will do cardiac CT to see if he is a candidate for it.

## 2011-10-23 ENCOUNTER — Encounter: Payer: Self-pay | Admitting: *Deleted

## 2011-10-23 ENCOUNTER — Telehealth: Payer: Self-pay | Admitting: *Deleted

## 2011-10-23 NOTE — Telephone Encounter (Signed)
10/23/11--pt notified of "normal" labs--nt

## 2011-11-05 ENCOUNTER — Inpatient Hospital Stay (HOSPITAL_COMMUNITY): Admission: RE | Admit: 2011-11-05 | Payer: Medicare Other | Source: Ambulatory Visit

## 2011-11-12 ENCOUNTER — Other Ambulatory Visit: Payer: Self-pay | Admitting: Cardiovascular Disease

## 2011-11-12 ENCOUNTER — Ambulatory Visit (HOSPITAL_COMMUNITY)
Admission: RE | Admit: 2011-11-12 | Discharge: 2011-11-12 | Disposition: A | Discharge status: Home / Self Care | Payer: Medicare Other | Source: Ambulatory Visit | Attending: Cardiovascular Disease | Admitting: Cardiovascular Disease

## 2011-11-12 DIAGNOSIS — T578X1A Toxic effect of other specified inorganic substances, accidental (unintentional), initial encounter: Secondary | ICD-10-CM | POA: Insufficient documentation

## 2011-11-12 DIAGNOSIS — I359 Nonrheumatic aortic valve disorder, unspecified: Secondary | ICD-10-CM

## 2011-11-12 DIAGNOSIS — J90 Pleural effusion, not elsewhere classified: Secondary | ICD-10-CM | POA: Insufficient documentation

## 2011-11-12 DIAGNOSIS — T65891A Toxic effect of other specified substances, accidental (unintentional), initial encounter: Secondary | ICD-10-CM | POA: Insufficient documentation

## 2011-11-12 DIAGNOSIS — I350 Nonrheumatic aortic (valve) stenosis: Secondary | ICD-10-CM

## 2011-11-12 MED ORDER — IOHEXOL 350 MG/ML SOLN
150.0000 mL | Freq: Once | INTRAVENOUS | Status: AC | PRN
  Administered 2011-11-12: 80 mL via INTRAVENOUS

## 2011-11-12 MED ORDER — METOPROLOL TARTRATE 1 MG/ML IV SOLN
INTRAVENOUS | Status: AC
  Filled 2011-11-12: qty 20

## 2011-11-12 MED ORDER — NITROGLYCERIN 0.4 MG SL SUBL
SUBLINGUAL_TABLET | SUBLINGUAL | Status: AC
  Filled 2011-11-12: qty 25

## 2011-11-12 MED ORDER — IOHEXOL 350 MG/ML SOLN
70.0000 mL | Freq: Once | INTRAVENOUS | Status: AC | PRN
  Administered 2011-11-12: 70 mL via INTRAVENOUS

## 2011-11-24 ENCOUNTER — Encounter: Payer: Self-pay | Admitting: Cardiovascular Disease

## 2011-12-10 ENCOUNTER — Encounter: Payer: Self-pay | Admitting: *Deleted

## 2011-12-10 ENCOUNTER — Ambulatory Visit (INDEPENDENT_AMBULATORY_CARE_PROVIDER_SITE_OTHER): Payer: Medicare Other | Admitting: Cardiovascular Disease

## 2011-12-10 VITALS — BP 144/80 | HR 40

## 2011-12-10 DIAGNOSIS — I359 Nonrheumatic aortic valve disorder, unspecified: Secondary | ICD-10-CM

## 2011-12-10 DIAGNOSIS — I350 Nonrheumatic aortic (valve) stenosis: Secondary | ICD-10-CM

## 2011-12-10 NOTE — Patient Instructions (Signed)
Your physician has requested that you have a cardiac catheterization. Cardiac catheterization is used to diagnose and/or treat various heart conditions. Doctors may recommend this procedure for a number of different reasons. The most common reason is to evaluate chest pain. Chest pain can be a symptom of coronary artery disease (CAD), and cardiac catheterization can show whether plaque is narrowing or blocking your heart's arteries. This procedure is also used to evaluate the valves, as well as measure the blood flow and oxygen levels in different parts of your heart. For further information please visit [email or web address] Please follow instruction sheet, as given.  Scheduled for December 25, 2011

## 2011-12-11 ENCOUNTER — Encounter: Payer: Self-pay | Admitting: Cardiovascular Disease

## 2011-12-11 LAB — BASIC METABOLIC PANEL
Chloride: 97 mEq/L (ref 96–112)
Creatinine, Ser: 1.2 mg/dL (ref 0.4–1.5)
Potassium: 3.2 mEq/L — ABNORMAL LOW (ref 3.5–5.1)
Sodium: 140 mEq/L (ref 135–145)

## 2011-12-11 LAB — CBC WITH DIFFERENTIAL/PLATELET
Basophils Relative: 0.5 % (ref 0.0–3.0)
Eosinophils Relative: 1.5 % (ref 0.0–5.0)
MCV: 84.3 fl (ref 78.0–100.0)
Monocytes Absolute: 0.7 10*3/uL (ref 0.1–1.0)
Neutrophils Relative %: 61.7 % (ref 43.0–77.0)
RBC: 4.75 Mil/uL (ref 4.22–5.81)
WBC: 5.3 10*3/uL (ref 4.5–10.5)

## 2011-12-11 LAB — PROTIME-INR: INR: 1.7 ratio — ABNORMAL HIGH (ref 0.8–1.0)

## 2011-12-11 NOTE — Progress Notes (Signed)
HPI:  This is an 78 year old gentleman referred for evaluation of severe aortic stenosis and consideration of TAVR. The patient was initially seen in our practice in 2012 by Dr. Eden Emms. At that point he was noted to have moderately severe aortic stenosis, but he had minimal if any symptoms and considering his advanced age ongoing observation was recommended. However, he was hospitalized in January 2013 with atrial fibrillation and heart failure. He required DC cardioversion, failing to maintain sinus rhythm on 2 occasions. However, he has recently maintained sinus rhythm on amiodarone. The patient also required therapeutic thoracentesis for a large left pleural effusion and fluid studies demonstrated a transudative process. The patient convalesced in a skilled nursing facility but has returned home and is living independently with his wife. He is maintained on long-term anticoagulation with Xarelto and he is tolerating this without bleeding problems. He is fairly independent and still drives a car. He notes problems with poor balance, mild weakness, and short-term memory impairment. He is with his son today.  From a cardiovascular perspective, the patient's main complaint is dyspnea with exertion. He is limited to low level activities and can walk less than one block because of shortness of breath. He denies chest pain or pressure. He denies palpitations, lightheadedness, or syncope. The patient denies leg swelling, orthopnea, or PND. He has no history of stroke or TIA.  Multiple echo studies were reviewed. This study from January 2013 demonstrated normal left ventricular systolic function with an ejection fraction of 55%. He has moderate to severe left ventricular hypertrophy. The aortic valve was heavily calcified with mild aortic insufficiency. There was moderately severe aortic stenosis with a mean gradient of 35 and a peak gradient of 61 mm mercury. Aortic valve area calculated to 0.9. A limited echo  has been done to followup the pericardial effusion and this has now resolved.  The patient has undergone a cardiac CTA - findings as noted below.  Outpatient Encounter Prescriptions as of 12/10/2011  Medication Sig Dispense Refill  . albuterol-ipratropium (COMBIVENT) 18-103 MCG/ACT inhaler Inhale 2 puffs into the lungs every 6 (six) hours as needed for wheezing.  1 Inhaler  3  . ALPRAZolam (XANAX) 0.25 MG tablet Take 0.25 mg by mouth 3 (three) times daily as needed.      Marland Kitchen amiodarone (PACERONE) 200 MG tablet Take 1 tablet (200 mg total) by mouth 2 (two) times daily.  30 tablet  6  . atorvastatin (LIPITOR) 20 MG tablet Take 1 tablet (20 mg total) by mouth daily.  30 tablet  6  . cloNIDine (CATAPRES) 0.1 MG tablet Take 0.1 mg by mouth. 2 TABS TWICE DAILY      . diphenhydrAMINE-zinc acetate (BENADRYL) cream Apply topically 3 (three) times daily as needed.      . docusate sodium (COLACE) 100 MG capsule Take 100 mg by mouth daily.      . Fish Oil OIL 1 tablet by Does not apply route daily.       . furosemide (LASIX) 40 MG tablet 1 tab daily      . hydrocortisone cream 1 % Apply nightly to back      . labetalol (NORMODYNE) 100 MG tablet Take 100 mg by mouth 2 (two) times daily.      Marland Kitchen levothyroxine (SYNTHROID, LEVOTHROID) 25 MCG tablet Take 25 mcg by mouth daily.      Marland Kitchen losartan-hydrochlorothiazide (HYZAAR) 100-12.5 MG per tablet Take 1 tablet by mouth daily.      . Multiple Vitamin (MULTIVITAMIN)  capsule Take 1 capsule by mouth daily.        . Rivaroxaban (XARELTO) 15 MG TABS tablet Take 1 tablet (15 mg total) by mouth daily.  30 tablet  6    Review of patient's allergies indicates no known allergies.  Past Medical History  Diagnosis Date  . Hypertension   . Hyperlipidemia   . Aortic stenosis     Moderately Severe by echo 07/2011  . 1st degree AV block   . Shortness of breath   . Angina   . Chronic kidney disease     renal insufficiency  . Pleural effusion     07/2011 - Left > Right -  tapped (L) - Transudative  . Atrial fibrillation     07/2011 - amio and Rivaroxaban initiated - tee/dccv  . Diastolic CHF, chronic     07/2011 NL EF by echo  . Orthostatic hypotension     Past Surgical History  Procedure Date  . Tee without cardioversion 07/17/2011    Procedure: TRANSESOPHAGEAL ECHOCARDIOGRAM (TEE);  Surgeon: Lewayne Bunting, MD;  Location: [facility] ENDOSCOPY;  Service: Cardiovascular;  Laterality: N/A;  With Cardioversion    History   Social History  . Marital Status: Married    Spouse Name: N/A    Number of Children: N/A  . Years of Education: N/A   Occupational History  . Not on file.   Social History Main Topics  . Smoking status: Never Smoker   . Smokeless tobacco: Never Used  . Alcohol Use: No  . Drug Use: No  . Sexually Active: Not on file   Other Topics Concern  . Not on file   Social History Narrative  . No narrative on file    Family history: Negative for premature coronary artery disease.  ROS: General: no fevers/chills/night sweats Eyes: no blurry vision, diplopia, or amaurosis ENT: no sore throat or hearing loss Resp: no cough, wheezing, or hemoptysis CV: no edema or palpitations GI: no abdominal pain, nausea, vomiting, diarrhea, or constipation GU: no dysuria, frequency, or hematuria Skin: no rash Neuro: no headache, numbness, tingling, or weakness of extremities Musculoskeletal: no joint pain or swelling Heme: no bleeding, DVT, or easy bruising Endo: no polydipsia or polyuria  BP 144/80  Pulse 40  SpO2 97%  PHYSICAL EXAM: Pt is alert and oriented, elderly male, WD, WN, in no distress. HEENT: normal Neck: JVP normal. Carotid upstrokes normal with bilateral bruits. No thyromegaly. Lungs: equal expansion, clear bilaterally CV: Apex is discrete and nondisplaced, bradycardic and regular with grade 3/6 harsh systolic murmur at the left sternal border and diminished aortic closure sounds  Abd: soft, NT, +BS, no bruit, no  hepatosplenomegaly Back: no CVA tenderness Ext: Trace bilateral pretibial edema        Femoral pulses 2+= without bruits        DP/PT pulses intact and = Skin: warm and dry without rash Neuro: CNII-XII intact             Strength intact = bilaterally  CARDIAC CTA: Aortic Valve: The valve was trileaflet with severe limited  systolic motion. There was heavy calcification of the left  coronary cusp and commisure between the right and left cusps.  Ascending Aorta: No significant dilatation or aneurysm. Measures  3.3 cm at RPA  Sinus of Valsalva: Basal short axis diameters:  Left sinus: 35.78mm  Right sinus: 32.53mm  Noncoronary: 33.5 mm  Left main coronary artery was 16.4 mm above valve plane  Right coronary ostia was  17.5 mm above valve plane  Virtual Basal Annulus:  Area: 494.7cm2- calculated diameter 25.3mm  Perimeter: 87.35mm- calculated diameter 27.8 mm  Min/Max: 29mm long axis, 21.4 short axis average: 25.1 mm  Cornary Arteries: Suboptimal study. Moderate calcification of  proximal RAC, circumflex and LAD. Moderate calcification of mid  LAD. Distal vessels and branch vessels not well seen. No high  grade stenosis seen in the LM or proximal vessels  LV: Normal size and function. EF 62% ( EDV 219cc, ESV 84cc, SV  135cc) No discrete RWMA.  Peripheral Vessels: See separate report from Dr Denny Levy [facility]  Radiology  There is no aneurysm and no circumferential calcification. Mild  calcification of the anterior wall of the common femoral ateries.  The right iliac is markedly tortuous with two greater than 90  degree bens. The left iliac has mild tortuosity The minimal  luminal diameter of the right common femoral and iliac system is  10.4 mm and the minimal luminal diameter of the left system is 10.6  mm  Impression:  Patient has severe AS by echo criteria. Valve is trileaflet and  moderately calcified overall. There is no ascending aortic  aneurysm with root diameter of 33mm. The  average virtual basal  diameter is in the 25-27 mm range indicated a Medtronic Corevalve  of 29mm would be an appropriate size. There is no critical  proximal and mid CAD. The EF is normal at 62%. The common femoral  arteries and iliacs have minimal luminal diameters above 10mm but  the right iliac system is probably too tortuous for a TAVR  procedure.   ASSESSMENT AND PLAN: This is an 78 year old gentleman with severe aortic valve stenosis. Comorbidities include advanced age, memory impairment, and atrial fibrillation on chronic anticoagulation. I have reviewed his echo data, hospital records, and CT data extensively. I had along discussion with the patient and his son regarding the natural history of severe aortic stenosis and potential treatment options. He would like to pursue treatment of his aortic stenosis but is not interested in open AVR because of his age and desire to maintain independent living with his wife. I think the next step in evaluation is for the patient to undergo right and left heart catheterization for full hemodynamic assessment and evaluation for coronary artery disease. There is no critical coronary disease seen on his cardiac CTA but the study was not optimal for coronary evaluation. Depending on the findings at catheterization, would recommend PFTs, a 6 minute walk test, and evaluation in the multidisciplinary valve clinic. This will be arranged after his cath. I reviewed the risks, indications, and alternatives to cardiac catheterization with the patient and he understands and agrees to proceed. These risks include but are not limited to stroke, vascular injury, bleeding, myocardial infarction, and death.  45 minutes dedicated time was spent in direct patient care, greater than 50% of which was spent in discussion of expected outcomes, diagnostic, and treatment options for severe aortic stenosis.  [name] 12/11/2011 6:46 AM .

## 2011-12-12 ENCOUNTER — Telehealth: Payer: Self-pay | Admitting: Cardiovascular Disease

## 2011-12-12 DIAGNOSIS — E876 Hypokalemia: Secondary | ICD-10-CM

## 2011-12-12 MED ORDER — POTASSIUM CHLORIDE CRYS ER 20 MEQ PO TBCR: 20.0000 meq | EXTENDED_RELEASE_TABLET | Freq: Every day | ORAL | Status: DC

## 2011-12-12 NOTE — Telephone Encounter (Signed)
New Problem:    Patient's wife called in wanting to know if her husbands potassium regarding his latest blood results was going to be sent into his pharmacy. Please call back.

## 2011-12-12 NOTE — Telephone Encounter (Signed)
I spoke with the pt's wife and made her aware that Dr Excell Seltzer would like the pt to start potassium chloride 20 mEq once a day. The pt will have a BMP rechecked on 12/22/11. Rx sent to pharmacy.

## 2011-12-21 ENCOUNTER — Other Ambulatory Visit: Payer: Self-pay | Admitting: Cardiovascular Disease

## 2011-12-22 ENCOUNTER — Ambulatory Visit (INDEPENDENT_AMBULATORY_CARE_PROVIDER_SITE_OTHER): Payer: Medicare Other | Admitting: *Deleted

## 2011-12-22 ENCOUNTER — Telehealth: Payer: Self-pay | Admitting: *Deleted

## 2011-12-22 DIAGNOSIS — R0600 Dyspnea, unspecified: Secondary | ICD-10-CM

## 2011-12-22 DIAGNOSIS — R0989 Other specified symptoms and signs involving the circulatory and respiratory systems: Secondary | ICD-10-CM

## 2011-12-22 DIAGNOSIS — E876 Hypokalemia: Secondary | ICD-10-CM

## 2011-12-22 DIAGNOSIS — R0609 Other forms of dyspnea: Secondary | ICD-10-CM

## 2011-12-22 DIAGNOSIS — I359 Nonrheumatic aortic valve disorder, unspecified: Secondary | ICD-10-CM

## 2011-12-22 LAB — BRAIN NATRIURETIC PEPTIDE: Pro B Natriuretic peptide (BNP): 850 pg/mL — ABNORMAL HIGH (ref 0.0–100.0)

## 2011-12-22 LAB — BASIC METABOLIC PANEL
Calcium: 9 mg/dL (ref 8.4–10.5)
GFR: 67.51 mL/min (ref >60.00)
Sodium: 140 mEq/L (ref 135–145)

## 2011-12-22 NOTE — Telephone Encounter (Signed)
Pt seen in office today having pre cath lab work.  Pt experienced severe shortness of breath after going to bed last pm. " I thought I was going to die . I just couldn't get my breath" According to pt he was able to breathe better when he sat in his recliner. He did not sleep well last night. Pt denies any weight gain and says that his shortness of breath is no more than usual this morning.  Dr. Eden Emms examined pt. Suggested that he be seen this week by his pcp and needs a pulmonologist. Instructed pt also to call 911 if this occurs again. Pt verbalizes understanding.  Reassurance given.  I have called and talked with Dr. Chilton Si. His office will call pt. We will fax a copy of pt lab work to Dr. Chilton Si. A BNP was drawn in addition to his pre cath labs. Mylo Red RN

## 2011-12-22 NOTE — Telephone Encounter (Signed)
I spoke with wife that Dr. Eden Emms has reviewed his lab work. Advised if he has trouble again tonight with breathing he can take an extra furosemide 40mg  . He can also take an extra furosemide tomorrow if needed. Lab results forwarded to Dr. Stevan Born RN

## 2011-12-25 ENCOUNTER — Inpatient Hospital Stay (HOSPITAL_BASED_OUTPATIENT_CLINIC_OR_DEPARTMENT_OTHER)
Admission: RE | Admit: 2011-12-25 | Discharge: 2011-12-25 | Disposition: A | Discharge status: Home / Self Care | Payer: Medicare Other | Source: Ambulatory Visit | Attending: Cardiovascular Disease | Admitting: Cardiovascular Disease

## 2011-12-25 ENCOUNTER — Encounter (HOSPITAL_BASED_OUTPATIENT_CLINIC_OR_DEPARTMENT_OTHER)
Admission: RE | Disposition: A | Discharge status: Home / Self Care | Payer: Self-pay | Source: Ambulatory Visit | Attending: Cardiovascular Disease

## 2011-12-25 DIAGNOSIS — I359 Nonrheumatic aortic valve disorder, unspecified: Secondary | ICD-10-CM | POA: Insufficient documentation

## 2011-12-25 DIAGNOSIS — I2510 Atherosclerotic heart disease of native coronary artery without angina pectoris: Secondary | ICD-10-CM | POA: Insufficient documentation

## 2011-12-25 SURGERY — JV LEFT AND RIGHT HEART CATHETERIZATION WITH CORONARY ANGIOGRAM
Anesthesia: Moderate Sedation

## 2011-12-25 MED ORDER — ACETAMINOPHEN 325 MG PO TABS: 650.0000 mg | ORAL_TABLET | ORAL | Status: DC | PRN

## 2011-12-25 MED ORDER — ONDANSETRON HCL 4 MG/2ML IJ SOLN: 4.0000 mg | Freq: Four times a day (QID) | INTRAMUSCULAR | Status: DC | PRN

## 2011-12-25 MED ORDER — ASPIRIN 81 MG PO CHEW
324.0000 mg | CHEWABLE_TABLET | ORAL | Status: AC
  Administered 2011-12-25: 324 mg via ORAL

## 2011-12-25 MED ORDER — SODIUM CHLORIDE 0.9 % IV SOLN
INTRAVENOUS | Status: DC
  Administered 2011-12-25: 09:00:00 via INTRAVENOUS

## 2011-12-25 MED ORDER — SODIUM CHLORIDE 0.9 % IV SOLN: 1.0000 mL/kg/h | INTRAVENOUS | Status: DC

## 2011-12-25 MED ORDER — DIAZEPAM 2 MG PO TABS: 2.0000 mg | ORAL_TABLET | ORAL | Status: DC

## 2011-12-25 MED ORDER — SODIUM CHLORIDE 0.9 % IV SOLN: 250.0000 mL | INTRAVENOUS | Status: DC | PRN

## 2011-12-25 MED ORDER — DIAZEPAM 5 MG PO TABS
5.0000 mg | ORAL_TABLET | ORAL | Status: AC
  Administered 2011-12-25: 5 mg via ORAL

## 2011-12-25 MED ORDER — SODIUM CHLORIDE 0.9 % IJ SOLN: 3.0000 mL | INTRAMUSCULAR | Status: DC | PRN

## 2011-12-25 MED ORDER — SODIUM CHLORIDE 0.9 % IJ SOLN: 3.0000 mL | Freq: Two times a day (BID) | INTRAMUSCULAR | Status: DC

## 2011-12-25 NOTE — CV Procedure (Signed)
   Cardiac Catheterization Procedure Note  Name: Barry Thompson MRN: [id number] DOB: Jan 11, 1924  Procedure: Right Heart Cath, Left Heart Cath, Selective Coronary Angiography  Indication: Severe aortic stenosis  Procedural Details: The right groin was prepped, draped, and anesthetized with 1% lidocaine. Using the modified Seldinger technique a 6 French sheath was placed in the right femoral artery and a 6 French sheath was placed in the right femoral vein.A multipurpose catheter was used for the right heart catheterization. Standard protocol was followed for recording of right heart pressures and sampling of oxygen saturations. Fick cardiac output was calculated. Standard Judkins catheters were used for selective coronary angiography. The patient had marked iliac tortuosity. There was very difficult to maneuver catheters. The 6 French sheath was changed out to a 23 cm 6 [place] sheath.  I was able to cross the aortic valve using an AL-1 catheter and an exchange length straight-tip wire, but I was unable to advance a 6 [place] Langston catheter around the aortic arch. The aortic valve was recrossed with a 0.035 inch straight-tip wire directed by a JR 4 catheter. I was then able to advance the JR 4 catheter into the left ventricle so that pressure recording could be done. A pullback across the aortic valve was performed. There were no immediate procedural complications. The patient was transferred to the post catheterization recovery area for further monitoring.  Procedural Findings: Hemodynamics RA mean of 5  RV 50/12 PA 49/12 with a mean of 26 PCWP 24  LV  205/30  AO  168/60 with a mean of 100   Oxygen saturations: PA  66% AO  92%  Cardiac Output (Fick)  6 L per minute   Cardiac Index (Fick)  2.7 L per minute per meter square   Aortic valve hemodynamics: The mean gradient was 32 mmHg  , the aortic valve area calculated to 1.6 with an aortic valve area index of 0.7    Coronary  angiography: Coronary dominance: right  Left mainstem:  moderately calcified, widely patent with no obstructive disease.   Left anterior descending (LAD):  large caliber vessel that reaches the LV apex. There is 50% mid LAD stenosis present. There were no high-grade lesions in the LAD distribution.   Left circumflex (LCx):  there is mild diffuse plaque and a large left circumflex system. The large obtuse marginal branch divides into multiple subbranches. There is significant subbranch stenosis with an 80% ostial branch stenosis of the first OM.  Right coronary artery (RCA):  this is a large, dominant vessel. There is 30-40% mid RCA stenosis. The PDA branch is diffusely diseased without focal high-grade stenosis.   Left ventriculography: deferred   Final Conclusions:   1. Moderate coronary artery disease as outlined above with moderate stenosis of the mid LAD, severe stenosis of the left circumflex obtuse marginal subbranch, and nonobstructive disease of the right coronary artery 2. Moderately severe aortic stenosis 3. Elevated left ventricular filling pressures  Recommendations:  we'll followup with the patient in the multidisciplinary valve clinic. He does not have critical coronary artery disease. While he is symptomatic with exertional shortness of breath, his aortic stenosis seems to be on the border of moderate to severe when his echo data and cath data are both considered. The patient should have pulmonary function testing and a 6 minute walk test done prior to his valve clinic visit and these studies will be scheduled.   Barry Thompson 12/25/2011, 6:14 PM

## 2011-12-25 NOTE — Progress Notes (Signed)
Bedrest begins @ 1100, tegaderm dressing applied by Army Melia RN

## 2011-12-29 LAB — POCT I-STAT 3, VENOUS BLOOD GAS (G3P V)
Acid-Base Excess: 7 mmol/L — ABNORMAL HIGH (ref 0.0–2.0)
O2 Saturation: 66 %
pCO2, Ven: 52.6 mmHg — ABNORMAL HIGH (ref 45.0–50.0)
pCO2, Ven: 56.2 mmHg — ABNORMAL HIGH (ref 45.0–50.0)
pH, Ven: 7.386 — ABNORMAL HIGH (ref 7.250–7.300)
pO2, Ven: 36 mmHg (ref 30.0–45.0)
pO2, Ven: 64 mmHg — ABNORMAL HIGH (ref 30.0–45.0)

## 2012-01-08 ENCOUNTER — Other Ambulatory Visit: Payer: Self-pay

## 2012-01-08 ENCOUNTER — Other Ambulatory Visit: Payer: Self-pay | Admitting: Thoracic Surgery (Cardiothoracic Vascular Surgery)

## 2012-01-08 DIAGNOSIS — I359 Nonrheumatic aortic valve disorder, unspecified: Secondary | ICD-10-CM

## 2012-01-08 DIAGNOSIS — I350 Nonrheumatic aortic (valve) stenosis: Secondary | ICD-10-CM

## 2012-01-16 ENCOUNTER — Ambulatory Visit (HOSPITAL_COMMUNITY): Payer: Medicare Other | Attending: Internal Medicine | Admitting: Radiology

## 2012-01-16 DIAGNOSIS — I319 Disease of pericardium, unspecified: Secondary | ICD-10-CM | POA: Insufficient documentation

## 2012-01-16 DIAGNOSIS — I517 Cardiomegaly: Secondary | ICD-10-CM | POA: Insufficient documentation

## 2012-01-16 DIAGNOSIS — I350 Nonrheumatic aortic (valve) stenosis: Secondary | ICD-10-CM

## 2012-01-16 DIAGNOSIS — I4430 Unspecified atrioventricular block: Secondary | ICD-10-CM | POA: Insufficient documentation

## 2012-01-16 DIAGNOSIS — I10 Essential (primary) hypertension: Secondary | ICD-10-CM | POA: Insufficient documentation

## 2012-01-16 DIAGNOSIS — I359 Nonrheumatic aortic valve disorder, unspecified: Secondary | ICD-10-CM

## 2012-01-16 DIAGNOSIS — R0989 Other specified symptoms and signs involving the circulatory and respiratory systems: Secondary | ICD-10-CM | POA: Insufficient documentation

## 2012-01-16 DIAGNOSIS — J90 Pleural effusion, not elsewhere classified: Secondary | ICD-10-CM | POA: Insufficient documentation

## 2012-01-16 DIAGNOSIS — I059 Rheumatic mitral valve disease, unspecified: Secondary | ICD-10-CM | POA: Insufficient documentation

## 2012-01-16 DIAGNOSIS — I4891 Unspecified atrial fibrillation: Secondary | ICD-10-CM | POA: Insufficient documentation

## 2012-01-16 DIAGNOSIS — R0609 Other forms of dyspnea: Secondary | ICD-10-CM | POA: Insufficient documentation

## 2012-01-16 NOTE — Progress Notes (Signed)
Echocardiogram performed.  

## 2012-01-19 ENCOUNTER — Encounter (HOSPITAL_COMMUNITY): Payer: Self-pay | Admitting: Thoracic Surgery (Cardiothoracic Vascular Surgery)

## 2012-01-19 ENCOUNTER — Ambulatory Visit (HOSPITAL_COMMUNITY)
Admission: RE | Admit: 2012-01-19 | Discharge: 2012-01-19 | Disposition: A | Discharge status: Home / Self Care | Payer: Medicare Other | Source: Ambulatory Visit | Attending: Cardiovascular Disease | Admitting: Cardiovascular Disease

## 2012-01-19 ENCOUNTER — Ambulatory Visit (HOSPITAL_COMMUNITY)
Admission: RE | Admit: 2012-01-19 | Discharge: 2012-01-19 | Disposition: A | Discharge status: Home / Self Care | Payer: Medicare Other | Source: Ambulatory Visit | Attending: Thoracic Surgery (Cardiothoracic Vascular Surgery) | Admitting: Thoracic Surgery (Cardiothoracic Vascular Surgery)

## 2012-01-19 VITALS — BP 120/58 | HR 42 | Resp 20 | Ht 73.0 in | Wt 214.8 lb

## 2012-01-19 DIAGNOSIS — J4489 Other specified chronic obstructive pulmonary disease: Secondary | ICD-10-CM | POA: Insufficient documentation

## 2012-01-19 DIAGNOSIS — J449 Chronic obstructive pulmonary disease, unspecified: Secondary | ICD-10-CM | POA: Insufficient documentation

## 2012-01-19 DIAGNOSIS — I509 Heart failure, unspecified: Secondary | ICD-10-CM | POA: Insufficient documentation

## 2012-01-19 DIAGNOSIS — J984 Other disorders of lung: Secondary | ICD-10-CM | POA: Insufficient documentation

## 2012-01-19 DIAGNOSIS — R0602 Shortness of breath: Secondary | ICD-10-CM | POA: Insufficient documentation

## 2012-01-19 DIAGNOSIS — I10 Essential (primary) hypertension: Secondary | ICD-10-CM | POA: Insufficient documentation

## 2012-01-19 DIAGNOSIS — I5032 Chronic diastolic (congestive) heart failure: Secondary | ICD-10-CM | POA: Insufficient documentation

## 2012-01-19 DIAGNOSIS — I359 Nonrheumatic aortic valve disorder, unspecified: Secondary | ICD-10-CM

## 2012-01-19 DIAGNOSIS — N189 Chronic kidney disease, unspecified: Secondary | ICD-10-CM | POA: Insufficient documentation

## 2012-01-19 DIAGNOSIS — I2510 Atherosclerotic heart disease of native coronary artery without angina pectoris: Secondary | ICD-10-CM | POA: Insufficient documentation

## 2012-01-19 DIAGNOSIS — I4891 Unspecified atrial fibrillation: Secondary | ICD-10-CM | POA: Insufficient documentation

## 2012-01-19 DIAGNOSIS — E785 Hyperlipidemia, unspecified: Secondary | ICD-10-CM | POA: Insufficient documentation

## 2012-01-19 DIAGNOSIS — I4430 Unspecified atrioventricular block: Secondary | ICD-10-CM | POA: Insufficient documentation

## 2012-01-19 DIAGNOSIS — J90 Pleural effusion, not elsewhere classified: Secondary | ICD-10-CM | POA: Insufficient documentation

## 2012-01-19 DIAGNOSIS — R079 Chest pain, unspecified: Secondary | ICD-10-CM | POA: Insufficient documentation

## 2012-01-19 DIAGNOSIS — I350 Nonrheumatic aortic (valve) stenosis: Secondary | ICD-10-CM

## 2012-01-19 DIAGNOSIS — I951 Orthostatic hypotension: Secondary | ICD-10-CM | POA: Insufficient documentation

## 2012-01-19 HISTORY — DX: Atherosclerotic heart disease of native coronary artery without angina pectoris: I25.10

## 2012-01-19 LAB — PULMONARY FUNCTION TEST

## 2012-01-19 MED ORDER — ALBUTEROL SULFATE (5 MG/ML) 0.5% IN NEBU
2.5000 mg | INHALATION_SOLUTION | Freq: Once | RESPIRATORY_TRACT | Status: AC
  Administered 2012-01-19: 2.5 mg via RESPIRATORY_TRACT

## 2012-01-19 NOTE — Progress Notes (Signed)
[address]            [place] [address]          [phone]     CARDIOTHORACIC SURGERY CONSULTATION REPORT  Referring Provider is [name], MD PCP is GREEN, [name], MD  Chief Complaint  Patient presents with  . Aortic Stenosis    eval for possible tavr    HPI:  Patient is an 78 year old gentleman from [place] with severe aortic stenosis, hypertension, congestive heart failure, coronary artery disease, and atrial fibrillation. The patient has been followed for more than past year by Dr. Eden Emms do to the presence of aortic stenosis. This past New Years' the patient developed acute exacerbation of class IV congestive heart failure in the setting of persistent atrial fibrillation. He initially underwent DC cardioversion but failed and return to atrial fibrillation. He also underwent thoracentesis for pleural effusion. Ultimately he was successfully cardioverted back to sinus rhythm after amiodarone therapy was initiated. He was discharged from the hospital to a skilled nursing facility and since then he has recovered and gone back to living at home independently with his wife.  He has been followed carefully since then by Dr. Eden Emms who has subsequently referred him to Dr. Excell Seltzer to consider transcatheter aortic valve replacement, and he has now been referred to the multidisciplinary heart valve clinic.  The patient describes significant exertional shortness of breath associated with relatively mild physical activity. He has had occasional episodes of transient resting shortness of breath including one episode 2 weeks ago when he was awoke from his sleep experiencing severe resting shortness of breath that improved after he sat up on the edge of the bed for several minutes. He has not had chest pain, palpitations, dizzy spells, nor syncope.  He has not had lower extremity swelling.  Past Medical History  Diagnosis Date  . Hypertension   .  Hyperlipidemia   . Aortic stenosis     Moderately Severe by echo 07/2011  . 1st degree AV block   . Shortness of breath   . Angina   . Chronic kidney disease     renal insufficiency  . Pleural effusion     07/2011 - Left > Right - tapped (L) - Transudative  . Atrial fibrillation     07/2011 - amio and Rivaroxaban initiated - tee/dccv  . Diastolic CHF, chronic     07/2011 NL EF by echo  . Orthostatic hypotension     Past Surgical History  Procedure Date  . Tee without cardioversion 07/17/2011    Procedure: TRANSESOPHAGEAL ECHOCARDIOGRAM (TEE);  Surgeon: Lewayne Bunting, MD;  Location: [facility] ENDOSCOPY;  Service: Cardiovascular;  Laterality: N/A;  With Cardioversion    No family history on file.  Social History History  Substance Use Topics  . Smoking status: Never Smoker   . Smokeless tobacco: Never Used  . Alcohol Use: No    Current Outpatient Prescriptions  Medication Sig Dispense Refill  . ALPRAZolam (XANAX) 0.25 MG tablet Take 0.25 mg by mouth 3 (three) times daily as needed.      Marland Kitchen amiodarone (PACERONE) 200 MG tablet Take 1 tablet (200 mg total) by mouth 2 (two) times daily.  30 tablet  6  . atorvastatin (LIPITOR) 20 MG tablet Take 1 tablet (20 mg total) by mouth daily.  30 tablet  6  . cloNIDine (CATAPRES) 0.1 MG tablet Take  0.1 mg by mouth. 2 TABS TWICE DAILY      . diphenhydrAMINE-zinc acetate (BENADRYL) cream Apply topically 3 (three) times daily as needed.      . docusate sodium (COLACE) 100 MG capsule Take 100 mg by mouth daily.      . Fish Oil OIL 1 tablet by Does not apply route daily.       . furosemide (LASIX) 40 MG tablet 1 tab daily      . hydrocortisone cream 1 % Apply nightly to back      . labetalol (NORMODYNE) 100 MG tablet Take 100 mg by mouth 2 (two) times daily.      Marland Kitchen levothyroxine (SYNTHROID, LEVOTHROID) 25 MCG tablet Take 25 mcg by mouth daily.      Marland Kitchen losartan-hydrochlorothiazide (HYZAAR) 100-12.5 MG per tablet Take 1 tablet by mouth daily.      .  Multiple Vitamin (MULTIVITAMIN) capsule Take 1 capsule by mouth daily.        . potassium chloride SA (K-DUR,KLOR-CON) 20 MEQ tablet Take 1 tablet (20 mEq total) by mouth daily.  90 tablet  3  . Rivaroxaban (XARELTO) 15 MG TABS tablet Take 1 tablet (15 mg total) by mouth daily.  30 tablet  6   No current facility-administered medications for this encounter.   Facility-Administered Medications Ordered in Other Encounters  Medication Dose Route Frequency Provider Last Rate Last Dose  . albuterol (PROVENTIL) (5 MG/ML) 0.5% nebulizer solution 2.5 mg  2.5 mg Nebulization Once Purcell Nails, MD   2.5 mg at 01/19/12 1451    No Known Allergies  Review of Systems:  General:  normal appetite, normal energy   Respiratory:  no cough, no wheezing, no hemoptysis, no pain with inspiration or cough, + shortness of breath  Cardiac:   no chest pain or tightness, + exertional SOB, + occasional resting SOB, + PND, no orthopnea, no LE edema, no palpitations, no syncope  GI:   no difficulty swallowing, no hematochezia, no hematemesis, no melena, no constipation, no diarrhea   GU:   no dysuria, no urgency, no frequency   Musculoskeletal: no arthritis, no arthralgia   Vascular:  no pain suggestive of claudication   Neuro:   no symptoms suggestive of TIA's, no seizures, no headaches, no peripheral neuropathy   Endocrine:  Negative   HEENT:  no loose teeth or painful teeth, hasn't seen a dentist in years, no recent vision changes  Psych:   no anxiety, no depression    Physical Exam:   BP 120/58  Pulse 42  Resp 20  Ht 6\' 1"  (1.854 m)  Wt 214 lb 12 oz (97.41 kg)  BMI 28.33 kg/m2  SpO2 95%  General:  Elderly and somewhat frail - appearing  HEENT:  Unremarkable   Neck:   no JVD, no bruits, no adenopathy   Chest:   clear to auscultation, symmetrical breath sounds, no wheezes, no rhonchi   CV:   RRR, grade III/VI crescendo-decrescendo systolic murmur   Abdomen:  soft, non-tender, no masses    Extremities:  warm, well-perfused, pulses not palpable, no LE edema  Rectal/GU  Deferred  Neuro:   Grossly non-focal and symmetrical throughout  Skin:   Clean and dry, no rashes, no breakdown   Diagnostic Tests:  Transthoracic Echocardiography  Patient: [place], Hollar MR #: [id number] Study Date: 01/16/2012 Gender: M Age: 21 Height: 185.4cm Weight: 93kg BSA: 2.5m^2 Pt. Status: Room:  ATTENDING Dietrich Pates [facility] [name], MD REFERRING [name], MD  SONOGRAPHER Aida Raider PERFORMING [facility], [facility] cc:  ------------------------------------------------------------ LV EF: 65% - 70%  ------------------------------------------------------------ Indications: 424.1 Aortic valve disorders.  ------------------------------------------------------------ History: PMH: Acquired from the patient and from the patient's chart. PMH: Pericardial Effusion. Pleural Effusion. Atrial Fibrillation. Dyspnea. Chest pain. First Degree AV Block. Risk factors: Hypertension.  ------------------------------------------------------------ Study Conclusions  - Left ventricle: The cavity size was normal. Wall thickness was increased in a pattern of severe LVH. Systolic function was vigorous. The estimated ejection fraction was in the range of 65% to 70%. - Aortic valve: AV is thickened, calcified with restricted motion. Peak and mean pressures are 80 and 54 mm Hg respectively consistent with severe AS. Mean gradient: 56mm Hg (S). Peak gradient: 82mm Hg (S). - Mitral valve: Mild regurgitation. - Left atrium: The atrium was moderately to severely dilated. - Pulmonary arteries: PA peak pressure: 34mm Hg (S). Transthoracic echocardiography. M-mode, complete 2D, spectral Doppler, and color Doppler. Height: Height: 185.4cm. Height: 73in. Weight: Weight: 93kg. Weight: 204.6lb. Body mass index: BMI: 27kg/m^2. Body surface area: BSA: 2.19m^2. Blood pressure: 189/81.  Patient status: Outpatient. Location: [place] [facility]  ------------------------------------------------------------  ------------------------------------------------------------ Left ventricle: The cavity size was normal. Wall thickness was increased in a pattern of severe LVH. Systolic function was vigorous. The estimated ejection fraction was in the range of 65% to 70%.  ------------------------------------------------------------ Aortic valve: AV is thickened, calcified with restricted motion. Peak and mean pressures are 80 and 54 mm Hg respectively consistent with severe AS. Doppler: VTI ratio of LVOT to aortic valve: 0.28. Indexed valve area: 0.44cm^2/m^2 (VTI). Peak velocity ratio of LVOT to aortic valve: 0.27. Indexed valve area: 0.43cm^2/m^2 (Vmax). Mean gradient: 56mm Hg (S). Peak gradient: 82mm Hg (S).  ------------------------------------------------------------ Mitral valve: Calcified annulus. Mildly thickened leaflets . Doppler: Mild regurgitation. Peak gradient: 5mm Hg (D).  ------------------------------------------------------------ Left atrium: The atrium was moderately to severely dilated.  ------------------------------------------------------------ Right ventricle: The cavity size was normal. Wall thickness was normal. Systolic function was normal.  ------------------------------------------------------------ Pulmonic valve: Mildly thickened leaflets. Doppler: Trivial regurgitation.  ------------------------------------------------------------ Tricuspid valve: Structurally normal valve. Leaflet separation was normal. Doppler: Transvalvular velocity was within the normal range. Trivial regurgitation.  ------------------------------------------------------------ Right atrium: The atrium was mildly dilated.  ------------------------------------------------------------ Pericardium: There was no pericardial  effusion.  ------------------------------------------------------------ Systemic veins: Inferior vena cava: The vessel was normal in size; the respirophasic diameter changes were in the normal range (= 50%); findings are consistent with normal central venous pressure.  ------------------------------------------------------------  2D measurements Normal Doppler measurements Normal Left ventricle Main pulmonary LVID ED, 50.3 mm 43-52 artery chord, Pressure, 34 mm Hg =30 PLAX S LVID ES, 30 mm 23-38 Left ventricle chord, Ea, lat 7.01 cm/s ------ PLAX ann, tiss FS, chord, 40 % >29 DP PLAX E/Ea, lat 16.6 ------ LVPW, ED 16.8 mm ------ ann, tiss 9 IVS/LVPW 1.21 <1.3 DP ratio, ED Ea, med 4.81 cm/s ------ Ventricular septum ann, tiss IVS, ED 20.3 mm ------ DP LVOT E/Ea, med 24.3 ------ Diam, S 21 mm ------ ann, tiss 2 Area 3.46 cm^2 ------ DP Diam 21 mm ------ LVOT Aorta Peak vel, 123 cm/s ------ Root diam, 35 mm ------ S ED VTI, S 35.6 cm ------ Left atrium Peak 6 mm Hg ------ AP dim 49 mm ------ gradient, AP dim 2.26 cm/m^2 <2.2 S index Stroke vol 123. ml ------ 3 Stroke 56.8 ml/m^2 ------ index Aortic valve Peak vel, 456 cm/s ------ S Mean vel, 358 cm/s ------ S VTI, S 129 cm ------ Mean 56 mm Hg ------  gradient, S Peak 82 mm Hg ------ gradient, S VTI ratio 0.28 ------ LVOT/AV Area index 0.44 cm^2/m ------ (VTI) ^2 Peak vel 0.27 ------ ratio, LVOT/AV Area index 0.43 cm^2/m ------ (Vmax) ^2 Regurg PHT 811 ms ------ Mitral valve Peak E vel 117 cm/s ------ Peak A vel 54.3 cm/s ------ Decelerati 616 ms 150-23 on time 0 Peak 5 mm Hg ------ gradient, D Peak E/A 2.2 ------ ratio Tricuspid valve Regurg 269 cm/s ------ peak vel Peak RV-RA 29 mm Hg ------ gradient, S Systemic veins Estimated 5 mm Hg ------ CVP Right ventricle Pressure, 34 mm Hg <30 S Sa vel, 11.2 cm/s ------ lat ann, tiss  DP  ------------------------------------------------------------ Prepared and Electronically Authenticated by  Dietrich Pates 2013-07-05T16:[id number]   Cardiac Catheterization Procedure Note  Name: DEMOND SHALLENBERGER  MRN: [id number]  DOB: 08-29-1923  Procedure: Right Heart Cath, Left Heart Cath, Selective Coronary Angiography  Indication: Severe aortic stenosis  Procedural Details: The right groin was prepped, draped, and anesthetized with 1% lidocaine. Using the modified Seldinger technique a 6 French sheath was placed in the right femoral artery and a 6 French sheath was placed in the right femoral vein.A multipurpose catheter was used for the right heart catheterization. Standard protocol was followed for recording of right heart pressures and sampling of oxygen saturations. Fick cardiac output was calculated. Standard Judkins catheters were used for selective coronary angiography. The patient had marked iliac tortuosity. There was very difficult to maneuver catheters. The 6 French sheath was changed out to a 23 cm 6 [place] sheath. I was able to cross the aortic valve using an AL-1 catheter and an exchange length straight-tip wire, but I was unable to advance a 6 [place] Langston catheter around the aortic arch. The aortic valve was recrossed with a 0.035 inch straight-tip wire directed by a JR 4 catheter. I was then able to advance the JR 4 catheter into the left ventricle so that pressure recording could be done. A pullback across the aortic valve was performed. There were no immediate procedural complications. The patient was transferred to the post catheterization recovery area for further monitoring.  Procedural Findings:  Hemodynamics  RA mean of 5  RV 50/12  PA 49/12 with a mean of 26  PCWP 24  LV 205/30  AO 168/60 with a mean of 100  Oxygen saturations:  PA 66%  AO 92%  Cardiac Output (Fick) 6 L per minute  Cardiac Index (Fick) 2.7 L per minute per meter square  Aortic valve  hemodynamics: The mean gradient was 32 mmHg , the aortic valve area calculated to 1.6 with an aortic valve area index of 0.7  Coronary angiography:  Coronary dominance: right  Left mainstem: moderately calcified, widely patent with no obstructive disease.  Left anterior descending (LAD): large caliber vessel that reaches the LV apex. There is 50% mid LAD stenosis present. There were no high-grade lesions in the LAD distribution.  Left circumflex (LCx): there is mild diffuse plaque and a large left circumflex system. The large obtuse marginal branch divides into multiple subbranches. There is significant subbranch stenosis with an 80% ostial branch stenosis of the first OM.  Right coronary artery (RCA): this is a large, dominant vessel. There is 30-40% mid RCA stenosis. The PDA branch is diffusely diseased without focal high-grade stenosis.  Left ventriculography: deferred  Final Conclusions:  1. Moderate coronary artery disease as outlined above with moderate stenosis of the mid LAD, severe stenosis of the left circumflex obtuse marginal subbranch, and nonobstructive  disease of the right coronary artery  2. Moderately severe aortic stenosis  3. Elevated left ventricular filling pressures  Recommendations: we'll followup with the patient in the multidisciplinary valve clinic. He does not have critical coronary artery disease. While he is symptomatic with exertional shortness of breath, his aortic stenosis seems to be on the border of moderate to severe when his echo data and cath data are both considered. The patient should have pulmonary function testing and a 6 minute walk test done prior to his valve clinic visit and these studies will be scheduled.  [name]  12/25/2011, 6:14 PM       OVER-READ INTERPRETATION - CT CHEST  The following report is an over-read performed by radiologist Dr.  Reyes Ivan, M.D. of [facility] on 11/13/2011  08:16:32. This over-read does not  include interpretation of  cardiac or coronary anatomy or pathology. The CTA interpretation  by the cardiologist is attached.  Comparison: CT chest 07/18/2011.  Findings: No pathologically enlarged lymph nodes in the visualized  portion of the mediastinum and hilar regions. Pericardial effusion  has resolved in the interval. Small bilateral pleural effusions,  decreased in size. Calcified pleural plaques are seen bilaterally.  Incomplete resolution of left lower lobe air space disease.  Visualized portion of the upper abdomen is unremarkable. No  worrisome lytic or sclerotic lesions in the visualized osseous  structures.  IMPRESSION:  1. Interval resolution of pericardial effusion.  2. Small bilateral pleural effusions with incompletely resolved  left lower lobe air space disease.  3. Asbestos related pleural disease.  Cardiac CT:  Indication: Assess Aortic Valve  Protocol: The patient was scanned on a Philips 256 scanner.  Gantry rotation speed was 270 msec and collimation.30mm. No beta  blocker or nitro were given. Average HR during scan was 56 bpm. A  retrospective scan was done to look at valve motion and assess EF.  The patient received 80cc of contrast. A nongated run-off study of  the abdominal and pelvic vasculature was done with 70c of contrast  following the cardiac scan. The 3D data set was sent to a Philips  work station for analysis using MPR, MIP and VRT modes  Aortic Valve: The valve was trileaflet with severe limited  systolic motion. There was heavy calcification of the left  coronary cusp and commisure between the right and left cusps.  Ascending Aorta: No significant dilatation or aneurysm. Measures  3.3 cm at RPA  Sinus of Valsalva: Basal short axis diameters:  Left sinus: 35.26mm  Right sinus: 32.37mm  Noncoronary: 33.5 mm  Left main coronary artery was 16.4 mm above valve plane  Right coronary ostia was 17.5 mm above valve plane  Virtual Basal Annulus:  Area:  494.7cm2- calculated diameter 25.65mm  Perimeter: 87.37mm- calculated diameter 27.8 mm  Min/Max: 29mm long axis, 21.4 short axis average: 25.1 mm  Cornary Arteries: Suboptimal study. Moderate calcification of  proximal RAC, circumflex and LAD. Moderate calcification of mid  LAD. Distal vessels and branch vessels not well seen. No high  grade stenosis seen in the LM or proximal vessels  LV: Normal size and function. EF 62% ( EDV 219cc, ESV 84cc, SV  135cc) No discrete RWMA.  Peripheral Vessels: See separate report from Dr Denny Levy [facility]  Radiology  There is no aneurysm and no circumferential calcification. Mild  calcification of the anterior wall of the common femoral ateries.  The right iliac is markedly tortuous with two greater than 90  degree bens. The  left iliac has mild tortuosity The minimal  luminal diameter of the right common femoral and iliac system is  10.4 mm and the minimal luminal diameter of the left system is 10.6  mm  Impression:  Patient has severe AS by echo criteria. Valve is trileaflet and  moderately calcified overall. There is no ascending aortic  aneurysm with root diameter of 33mm. The average virtual basal  diameter is in the 25-27 mm range indicated a Medtronic Corevalve  of 29mm would be an appropriate size. There is no critical  proximal and mid CAD. The EF is normal at 62%. The common femoral  arteries and iliacs have minimal luminal diameters above 10mm but  the right iliac system is probably too tortuous for a TAVR  procedure.  Original Report Authenticated By: [email or web address]   STS Risk Calculator  Procedure   AVR + CABG  Risk of Mortality   5% Morbidity or Mortality  25% Prolonged LOS   15% Permanent Stroke   2% Prolonged Vent Support  15%    Impression:  The patient has severe symptomatic calcific aortic stenosis with preserved left ventricular systolic function and two-vessel coronary artery disease. Symptoms of congestive heart failure  became acutely exacerbated 6 months ago when he developed persistent atrial fibrillation. He has done better since maintaining sinus rhythm following cardioversion on amiodarone, but the patient continues to report significant symptoms of exertional shortness of breath with occasional episodes of resting shortness of breath and PND.  Risks associated with conventional aortic valve replacement and coronary artery bypass grafting would be high but not necessarily insurmountable.  From a functional standpoint it seems clear that congestive heart failure remains the patient's primary physical limitations at this time.  The patient's coronary artery disease appears angiographically significant but is likely not contributing substantially from a clinical standpoint at this time.  Therefore, transcatheter aortic valve replacement may be a viable less-invasive treatment alternative. Upon review of the patient's recent echocardiogram and CT angiogram, the patient's relatively large size aortic root might preclude the possibility of use of the [facility]  transcatheter valve, but it's conceivable that the Medtronic Corevalve might be a feasible alternative. The patient has some peripheral vascular disease but relatively large size iliac vessels, such that transfemoral access might be feasible.   Plan:  The patient has been seen in conjunction Dr. Excell Seltzer in the multidisciplinary heart valve clinic. Options have been discussed at length with the patient and his entire family including palliative medical therapy versus high-risk conventional aortic valve replacement with coronary artery bypass grafting versus referral for possible transcatheter aortic valve replacement at a center where TAVR procedures are currently being performed, including possible enrollment in a trial for the Medtronic Corevalve if necessary or desired.  Overall I favor referral for such consideration as he might be an excellent candidate for  transcatheter approach and the risks of conventional surgery will be fairly high.   However, the patient seems inclined to wish to proceed with conventional surgery despite the significant risk.  I have suggested that he take some time to discuss matters at length with his family. We will have him return in the office in one week to discuss options further.  All of his questions been addressed.   Salvatore Decent. Cornelius Moras, MD 01/19/2012 7:09 PM

## 2012-01-19 NOTE — Progress Notes (Signed)
6 minute walk test: Patient walked 850 feet on room air.  Patient tolerated well and only stopped twice for a short time due to being short of breath.  Other than some shortness of breath the patient had no angina or dizziness.  Pre-vitals were HR: 42, B/P: 120/58, SpO2: 95%.  Post walk vitals: HR: 56, B/P: 142/70, SpO2: 96%.

## 2012-01-21 ENCOUNTER — Encounter: Payer: Self-pay | Admitting: *Deleted

## 2012-01-23 ENCOUNTER — Encounter: Payer: Self-pay | Admitting: Thoracic Surgery (Cardiothoracic Vascular Surgery)

## 2012-01-23 ENCOUNTER — Ambulatory Visit (INDEPENDENT_AMBULATORY_CARE_PROVIDER_SITE_OTHER): Payer: Medicare Other | Admitting: Thoracic Surgery (Cardiothoracic Vascular Surgery)

## 2012-01-23 VITALS — BP 160/70 | HR 38 | Resp 16 | Ht 72.0 in | Wt 214.0 lb

## 2012-01-23 DIAGNOSIS — I350 Nonrheumatic aortic (valve) stenosis: Secondary | ICD-10-CM | POA: Insufficient documentation

## 2012-01-23 DIAGNOSIS — I2510 Atherosclerotic heart disease of native coronary artery without angina pectoris: Secondary | ICD-10-CM

## 2012-01-23 DIAGNOSIS — I359 Nonrheumatic aortic valve disorder, unspecified: Secondary | ICD-10-CM

## 2012-01-23 NOTE — Progress Notes (Signed)
[address]            [place] [address]          [phone]     CARDIOTHORACIC SURGERY OFFICE NOTE  Referring Provider is [name], MD Primary Cardiologist is [name], MD PCP is GREEN, [name], MD   HPI:  Patient returns for further followup of severe symptomatic aortic stenosis with congestive heart failure, hypertension, coronary artery disease, and atrial fibrillation. He was just seen in the multidisciplinary structural heart valve clinic last week in conjunction with Dr. Excell Seltzer. Since then he went to his dentist  (Dr Judie Petit. Rozanna Boer) for dental examination and cleaning. He was noted to have multiple broken and cracked teeth but no evidence for abscess nor other signs of infection. One of his teeth will ultimately need to be extracted, but his dentist did not feel that anything was pressing at this time. He returns to the office today with his family to further discuss treatment options. He reports no new problems nor complaints.   Current Outpatient Prescriptions  Medication Sig Dispense Refill  . ALPRAZolam (XANAX) 0.25 MG tablet Take 0.25 mg by mouth 3 (three) times daily as needed.      Marland Kitchen amiodarone (PACERONE) 200 MG tablet Take 1 tablet (200 mg total) by mouth 2 (two) times daily.  30 tablet  6  . atorvastatin (LIPITOR) 20 MG tablet Take 1 tablet (20 mg total) by mouth daily.  30 tablet  6  . cloNIDine (CATAPRES) 0.1 MG tablet Take 0.1 mg by mouth. 2 TABS TWICE DAILY      . diphenhydrAMINE-zinc acetate (BENADRYL) cream Apply topically 3 (three) times daily as needed.      . docusate sodium (COLACE) 100 MG capsule Take 100 mg by mouth daily.      . Fish Oil OIL 1 tablet by Does not apply route daily.       . furosemide (LASIX) 40 MG tablet 1 tab daily      . hydrocortisone cream 1 % Apply nightly to back      . labetalol (NORMODYNE) 100 MG tablet Take 100 mg by mouth 2 (two) times daily.      Marland Kitchen levothyroxine (SYNTHROID,  LEVOTHROID) 25 MCG tablet Take 25 mcg by mouth daily.      Marland Kitchen losartan-hydrochlorothiazide (HYZAAR) 100-12.5 MG per tablet Take 1 tablet by mouth daily.      . Multiple Vitamin (MULTIVITAMIN) capsule Take 1 capsule by mouth daily.        . potassium chloride SA (K-DUR,KLOR-CON) 20 MEQ tablet Take 1 tablet (20 mEq total) by mouth daily.  90 tablet  3  . Rivaroxaban (XARELTO) 15 MG TABS tablet Take 1 tablet (15 mg total) by mouth daily.  30 tablet  6      Physical Exam:   BP 160/70  Pulse 38  Resp 16  Ht 6' (1.829 m)  Wt 214 lb (97.07 kg)  BMI 29.02 kg/m2  SpO2 94%  General:  Well-appearing  Chest:   Few bibasilar crackles  CV:   Regular rate and rhythm with prominent systolic murmur  Incisions:  n/a  Abdomen:  Soft and nontender  Extremities:  Warm and well-perfused  Diagnostic Tests:  n/a   Impression:  Severe symptomatically calcific aortic stenosis with preserved left ventricular systolic function and two-vessel coronary artery disease. Risks associated with conventional aortic valve  replacement and coronary artery bypass grafting would be quite high, and the patient would be at considerable risk for prolonged convalescence and need for temporary and/or permanent rehabilitation skilled nursing facility due to to his baseline physical limitations. Transcatheter aortic valve replacement may represent a reasonable less invasive treatment alternative.  The patient's coronary artery disease is not critical in probably could be treated medically.  Plan:  I spent in excess of 30 minutes again reviewing issues and treatment options with the patient and his family here in the office today. They're now agreeable to go for evaluation at [facility] to see whether or not transcatheter aortic valve replacement might be a good option. If the patient is not felt to be candidate for transcatheter aortic valve replacement we would be happy to proceed with high-risk conventional  aortic valve replacement with or without coronary artery bypass grafting and clipping of left atrial appendage here in [place]. All their questions been addressed.   Salvatore Decent. Cornelius Moras, MD 01/23/2012 3:24 PM

## 2012-02-09 ENCOUNTER — Telehealth: Payer: Self-pay | Admitting: *Deleted

## 2012-02-09 ENCOUNTER — Telehealth: Payer: Self-pay | Admitting: Cardiovascular Disease

## 2012-02-09 NOTE — Telephone Encounter (Signed)
New msg Pt said he has been having some more problems with his breathing. He has been to see PCP but wanted to talk to someone here

## 2012-02-09 NOTE — Telephone Encounter (Signed)
I contacted Rev. Boggio to let him know that I had spoke with Stefani Dama, Dr, [place] Hughes' assistant, regarding his referral for TAVR.  She said that Dr. Kizzie Bane had just informed her that he needed Rev. Bayly to have his CT and echo repeated at their facility and he would be notified asap of the date of this appt.  He understood this new information.

## 2012-02-09 NOTE — Telephone Encounter (Signed)
I spoke with the patient. He wanted to make [place] aware that after 2 weeks, he has heard that [facility] has his records for evaluation for TAVR and he is currently waiting on them to call him with an appointment. He states his SOB is not getting better, but slowly and progressively getting worse. He states he will call back when his appointment is set with [facility].

## 2012-02-11 ENCOUNTER — Telehealth: Payer: Self-pay | Admitting: *Deleted

## 2012-02-12 NOTE — Telephone Encounter (Signed)
Barry Thompson is scheduled to see Dr. Italy Hughes at [facility] on 02/12/12.  I called him to verify that he had been informed of this appointment and he had.

## 2012-02-19 DIAGNOSIS — J449 Chronic obstructive pulmonary disease, unspecified: Secondary | ICD-10-CM | POA: Insufficient documentation

## 2012-02-29 ENCOUNTER — Ambulatory Visit: Payer: Medicare Other

## 2012-02-29 ENCOUNTER — Ambulatory Visit (INDEPENDENT_AMBULATORY_CARE_PROVIDER_SITE_OTHER): Payer: Medicare Other | Admitting: Emergency Medicine

## 2012-02-29 VITALS — BP 185/57 | HR 48 | Temp 98.1°F | Resp 18 | Ht 71.0 in | Wt 209.0 lb

## 2012-02-29 DIAGNOSIS — L03119 Cellulitis of unspecified part of limb: Secondary | ICD-10-CM

## 2012-02-29 DIAGNOSIS — Z23 Encounter for immunization: Secondary | ICD-10-CM

## 2012-02-29 DIAGNOSIS — L02419 Cutaneous abscess of limb, unspecified: Secondary | ICD-10-CM

## 2012-02-29 LAB — POCT CBC
Granulocyte percent: 74.2 %G (ref 37–80)
HCT, POC: 42.8 % — AB (ref 43.5–53.7)
Hemoglobin: 12.9 g/dL — AB (ref 14.1–18.1)
MCV: 87.8 fL (ref 80–97)
POC LYMPH PERCENT: 19.1 %L (ref 10–50)
RBC: 4.88 M/uL (ref 4.69–6.13)
RDW, POC: 16.3 %

## 2012-02-29 MED ORDER — DOXYCYCLINE HYCLATE 100 MG PO TABS: 100.0000 mg | ORAL_TABLET | Freq: Two times a day (BID) | ORAL | Status: AC

## 2012-02-29 NOTE — Progress Notes (Signed)
  Subjective:    Patient ID: Barry Thompson, male    DOB: 1923-12-11, 78 y.o.   MRN: [id number]  HPI patient here with the pain and swelling inside of his right leg. Apparently his injury was 10 days ago where he suffered a puncture of the medial portion of his right leg. He has significant amount of bleeding in the area. Over the last few days the area has been calmer red tender and swollen    Review of Systems     Objective:   Physical Exam there is a puncture wound present about 1 cm in length filled with a scab approximately 6 inches above the medial malleolus.  Results for orders placed in visit on 02/29/12  POCT CBC      Component Value Range   WBC 5.9  4.6 - 10.2 K/uL   Lymph, poc 1.1  0.6 - 3.4   POC LYMPH PERCENT 19.1  10 - 50 %L   MID (cbc) 0.4  0 - 0.9   POC MID % 6.7  0 - 12 %M   POC Granulocyte 4.4  2 - 6.9   Granulocyte percent 74.2  37 - 80 %G   RBC 4.88  4.69 - 6.13 M/uL   Hemoglobin 12.9 (*) 14.1 - 18.1 g/dL   HCT, POC 56.2 (*) 13.0 - 53.7 %   MCV 87.8  80 - 97 fL   MCH, POC 26.4 (*) 27 - 31.2 pg   MCHC 30.1 (*) 31.8 - 35.4 g/dL   RDW, POC 86.5     Platelet Count, POC 228  142 - 424 K/uL   MPV 7.2  0 - 99.8 fL      [facility] reading (PRIMARY) by  Dr. Cleta Alberts  No fracture      Assessment & Plan:  We'll treat with doxycycline twice a day keep the area clean with soap and water. Recheck in 72 hours

## 2012-02-29 NOTE — Patient Instructions (Addendum)

## 2012-03-03 ENCOUNTER — Ambulatory Visit (INDEPENDENT_AMBULATORY_CARE_PROVIDER_SITE_OTHER): Payer: Medicare Other | Admitting: Emergency Medicine

## 2012-03-03 VITALS — BP 177/70 | HR 41 | Temp 99.0°F | Resp 16 | Ht 72.0 in | Wt 208.0 lb

## 2012-03-03 DIAGNOSIS — L0291 Cutaneous abscess, unspecified: Secondary | ICD-10-CM

## 2012-03-03 DIAGNOSIS — T148XXA Other injury of unspecified body region, initial encounter: Secondary | ICD-10-CM

## 2012-03-03 DIAGNOSIS — L0390 Cellulitis, unspecified: Secondary | ICD-10-CM

## 2012-03-03 NOTE — Progress Notes (Signed)
  Subjective:    Patient ID: Barry Thompson, male    DOB: Apr 19, 1924, 78 y.o.   MRN: [id number]  HPI patient had recheck his leg injury. He developed a cellulitis at the site of puncture which is over the greater saphenous vein    Review of Systems     Objective:   Physical Exam there is a 1 inch open area that is filled with a blackish-crust. There is a significant decrease in the redness in that area. There is about a 3 x 3 cm hematoma beneath the blackened area.        Assessment & Plan:  Patient had any injury to his lower leg with hematoma formation and secondary cellulitis now improved on antibiotics.

## 2012-03-26 ENCOUNTER — Other Ambulatory Visit: Payer: Self-pay | Admitting: Emergency Medicine

## 2012-03-30 DIAGNOSIS — I2510 Atherosclerotic heart disease of native coronary artery without angina pectoris: Secondary | ICD-10-CM | POA: Insufficient documentation

## 2012-06-01 DIAGNOSIS — Z950 Presence of cardiac pacemaker: Secondary | ICD-10-CM

## 2012-06-01 HISTORY — DX: Presence of cardiac pacemaker: Z95.0

## 2012-06-15 ENCOUNTER — Telehealth: Payer: Self-pay | Admitting: *Deleted

## 2012-06-15 NOTE — Telephone Encounter (Signed)
NOT SURE PT NEEDS F/U  CHECKING ON  HOW PT FEELS SINCE SURGERY AT [facility]  UNABLE TO LEAVE MESSAGE WILL TRY LATER .[name]

## 2012-07-09 DIAGNOSIS — Z952 Presence of prosthetic heart valve: Secondary | ICD-10-CM | POA: Insufficient documentation

## 2012-07-13 DIAGNOSIS — Z950 Presence of cardiac pacemaker: Secondary | ICD-10-CM | POA: Insufficient documentation

## 2012-07-21 DIAGNOSIS — I442 Atrioventricular block, complete: Secondary | ICD-10-CM | POA: Insufficient documentation

## 2012-08-03 NOTE — Telephone Encounter (Signed)
SPOKE WITH WIFE  APPT MADE FOR  08-16-12 AT 11:45 WITH DR Eden Emms .[name]

## 2012-08-16 ENCOUNTER — Encounter: Payer: Self-pay | Admitting: Cardiovascular Disease

## 2012-08-16 ENCOUNTER — Ambulatory Visit (INDEPENDENT_AMBULATORY_CARE_PROVIDER_SITE_OTHER): Payer: Medicare Other | Admitting: Cardiovascular Disease

## 2012-08-16 VITALS — BP 134/62 | HR 57 | Ht 73.0 in | Wt 205.6 lb

## 2012-08-16 DIAGNOSIS — I4891 Unspecified atrial fibrillation: Secondary | ICD-10-CM

## 2012-08-16 DIAGNOSIS — I2510 Atherosclerotic heart disease of native coronary artery without angina pectoris: Secondary | ICD-10-CM

## 2012-08-16 DIAGNOSIS — I359 Nonrheumatic aortic valve disorder, unspecified: Secondary | ICD-10-CM

## 2012-08-16 DIAGNOSIS — Z954 Presence of other heart-valve replacement: Secondary | ICD-10-CM

## 2012-08-16 DIAGNOSIS — I351 Nonrheumatic aortic (valve) insufficiency: Secondary | ICD-10-CM

## 2012-08-16 DIAGNOSIS — I350 Nonrheumatic aortic (valve) stenosis: Secondary | ICD-10-CM

## 2012-08-16 DIAGNOSIS — Z952 Presence of prosthetic heart valve: Secondary | ICD-10-CM

## 2012-08-16 DIAGNOSIS — Z950 Presence of cardiac pacemaker: Secondary | ICD-10-CM

## 2012-08-16 NOTE — Addendum Note (Signed)
Addended by: Scherrie Bateman E on: 08/16/2012 01:20 PM   Modules accepted: Orders

## 2012-08-16 NOTE — Progress Notes (Signed)
Patient ID: Barry Thompson, male   DOB: 1923/11/03, 79 y.o.   MRN: [id number] 79 yo with history of AS  Recent Corevlave 29mm done at [facility].  Complicated by heart block and had pacer placed  Cath prior to TAVR with moderate disease not needing intervention  Final Conclusions:  1. Moderate coronary artery disease as outlined above with moderate stenosis of the mid LAD, severe stenosis of the left circumflex obtuse marginal subbranch, and nonobstructive disease of the right coronary artery  2. Moderately severe aortic stenosis  3. Elevated left ventricular filling pressures  Doing well needs pacer checked and referral to cardiac rehab  Less dyspnea and doing much better very glad he had TAVR done  ROS: Denies fever, malais, weight loss, blurry vision, decreased visual acuity, cough, sputum, SOB, hemoptysis, pleuritic pain, palpitaitons, heartburn, abdominal pain, melena, lower extremity edema, claudication, or rash.  All other systems reviewed and negative  General: Affect appropriate Healthy:  appears stated age HEENT: normal Neck supple with no adenopathy JVP normal no bruits no thyromegaly Lungs clear with no wheezing and good diaphragmatic motion Heart:  S1/S2 AR  murmur, no rub, gallop or click PMI normal Abdomen: benighn, BS positve, no tenderness, no AAA no bruit.  No HSM or HJR Distal pulses intact with no bruits No edema Neuro non-focal Skin warm and dry No muscular weakness   Current Outpatient Prescriptions  Medication Sig Dispense Refill  . ALPRAZolam (XANAX) 0.25 MG tablet Take 0.25 mg by mouth 3 (three) times daily as needed.      Marland Kitchen amLODipine (NORVASC) 10 MG tablet Take 10 mg by mouth daily.      . carvedilol (COREG) 25 MG tablet Take 25 mg by mouth 2 (two) times daily with a meal.      . cloNIDine (CATAPRES) 0.1 MG tablet Take 0.1 mg by mouth. 2 TABS TWICE DAILY      . cyanocobalamin 100 MCG tablet Take 100 mcg by mouth daily.      Marland Kitchen docusate sodium (COLACE) 100 MG  capsule Take 100 mg by mouth daily.      . furosemide (LASIX) 40 MG tablet Take 40 mg by mouth daily.      Marland Kitchen labetalol (NORMODYNE) 100 MG tablet Take 100 mg by mouth 2 (two) times daily.      Marland Kitchen levothyroxine (SYNTHROID, LEVOTHROID) 100 MCG tablet Take 100 mcg by mouth daily.      Marland Kitchen losartan-hydrochlorothiazide (HYZAAR) 100-12.5 MG per tablet Take 1 tablet by mouth daily.      . Multiple Vitamin (MULTIVITAMIN) capsule Take 1 capsule by mouth daily.        . potassium chloride SA (K-DUR,KLOR-CON) 20 MEQ tablet Take 1 tablet (20 mEq total) by mouth daily.  90 tablet  3  . Rivaroxaban (XARELTO) 15 MG TABS tablet Take 1 tablet (15 mg total) by mouth daily.  30 tablet  6  . spironolactone (ALDACTONE) 100 MG tablet Take 100 mg by mouth daily.      Marland Kitchen amiodarone (PACERONE) 200 MG tablet Take 1 tablet (200 mg total) by mouth 2 (two) times daily.  30 tablet  6  . atorvastatin (LIPITOR) 20 MG tablet Take 1 tablet (20 mg total) by mouth daily.  30 tablet  6    Allergies  Review of patient's allergies indicates no known allergies.  Electrocardiogram:  AV pacing rate 58  Assessment and Plan

## 2012-08-16 NOTE — Assessment & Plan Note (Signed)
S/P conversion AV pacing stable

## 2012-08-16 NOTE — Assessment & Plan Note (Signed)
Stable with no angina and good activity level.  Continue medical Rx  

## 2012-08-16 NOTE — Assessment & Plan Note (Signed)
S/P TAVR with 29 mm Core Valve.  AR murmur on exam F/U echo

## 2012-08-16 NOTE — Patient Instructions (Addendum)
Your physician recommends that you schedule a follow-up appointment in: 3 MONTHS WITH DR Eden Emms You have been referred to EP NEWLY IMPLANTED PACER AT [facility]  [employer]  NEEDS TO SEE PAULA THIS WEEK  AND CARDIAC [facility] Your physician has requested that you have an echocardiogram. Echocardiography is a painless test that uses sound waves to create images of your heart. It provides your doctor with information about the size and shape of your heart and how well your heart's chambers and valves are working. This procedure takes approximately one hour. There are no restrictions for this procedure.  DX AI

## 2012-08-16 NOTE — Assessment & Plan Note (Signed)
Will increase lower rate limit to 64  F/U tech and EP as he cannot do trans telephonic monitoring Related to TAVR and underlying conduction disease

## 2012-08-19 ENCOUNTER — Telehealth: Payer: Self-pay | Admitting: Cardiovascular Disease

## 2012-08-19 NOTE — Telephone Encounter (Signed)
New problem    Echo done at [facility] on  12/27. Does patient need this appt that on  2/10.  Will be faxing over results

## 2012-08-20 NOTE — Telephone Encounter (Signed)
Barry Thompson  THAT    RECORDS WERE RECEIVED  ALONG  WITH ECHO  RESULTS FROM December  AND REVIEWED BY  DR Eden Emms. PT DOES NOT NEED REPEAT  ECHO .ALSO REQUESTED FOR   Barry TO SEND [place] DISC OF ECHO PER DR NISHAN  ADDRESS GIVEN  WILL AWAIT DISC  .[name]

## 2012-08-20 NOTE — Telephone Encounter (Signed)
NO we will get a copy of echo from Jesusita Oka can you call [facility] at some point and get disc of images and copy of report

## 2012-08-23 ENCOUNTER — Other Ambulatory Visit (HOSPITAL_COMMUNITY): Payer: Medicare Other

## 2012-08-25 ENCOUNTER — Telehealth: Payer: Self-pay | Admitting: Cardiovascular Disease

## 2012-08-25 NOTE — Telephone Encounter (Signed)
New Problem    Would like to speak to nurse in regard to heart failure program for pt & why a prescription is needed.

## 2012-08-25 NOTE — Telephone Encounter (Signed)
REVIEWED LAST OFFICE NOTE CHF WAS NOT LISTED AS  DX.  ASHLEY WAS NOTIFIED .[name]

## 2012-08-25 NOTE — Telephone Encounter (Signed)
LMTCB ./CY 

## 2012-08-31 ENCOUNTER — Encounter: Payer: Medicare Other | Admitting: Internal Medicine

## 2012-10-01 ENCOUNTER — Other Ambulatory Visit: Payer: Self-pay | Admitting: Geriatric Medicine

## 2012-10-01 ENCOUNTER — Other Ambulatory Visit: Payer: Self-pay | Admitting: Internal Medicine

## 2012-10-01 DIAGNOSIS — R0602 Shortness of breath: Secondary | ICD-10-CM

## 2012-10-01 DIAGNOSIS — D649 Anemia, unspecified: Secondary | ICD-10-CM

## 2012-10-01 DIAGNOSIS — I10 Essential (primary) hypertension: Secondary | ICD-10-CM

## 2012-10-01 DIAGNOSIS — I359 Nonrheumatic aortic valve disorder, unspecified: Secondary | ICD-10-CM

## 2012-10-01 DIAGNOSIS — E039 Hypothyroidism, unspecified: Secondary | ICD-10-CM

## 2012-10-01 DIAGNOSIS — R609 Edema, unspecified: Secondary | ICD-10-CM

## 2012-10-06 ENCOUNTER — Encounter: Payer: Medicare Other | Admitting: Internal Medicine

## 2012-10-08 ENCOUNTER — Encounter: Payer: Self-pay | Admitting: Internal Medicine

## 2012-10-08 ENCOUNTER — Ambulatory Visit (INDEPENDENT_AMBULATORY_CARE_PROVIDER_SITE_OTHER): Payer: Medicare Other | Admitting: Internal Medicine

## 2012-10-08 VITALS — BP 156/82 | HR 82 | Ht 73.0 in | Wt 205.0 lb

## 2012-10-08 DIAGNOSIS — I4891 Unspecified atrial fibrillation: Secondary | ICD-10-CM

## 2012-10-08 DIAGNOSIS — I440 Atrioventricular block, first degree: Secondary | ICD-10-CM

## 2012-10-08 LAB — BASIC METABOLIC PANEL
Calcium: 9.2 mg/dL (ref 8.4–10.5)
GFR: 58.14 mL/min — ABNORMAL LOW (ref >60.00)
Potassium: 4.5 mEq/L (ref 3.5–5.1)
Sodium: 135 mEq/L (ref 135–145)

## 2012-10-08 LAB — HEPATIC FUNCTION PANEL
AST: 24 U/L (ref 0–37)
Albumin: 3.8 g/dL (ref 3.5–5.2)
Alkaline Phosphatase: 44 U/L (ref 39–117)
Total Bilirubin: 0.5 mg/dL (ref 0.3–1.2)

## 2012-10-08 LAB — PACEMAKER DEVICE OBSERVATION
AL THRESHOLD: 1 V
ATRIAL PACING PM: 99.87
RV LEAD AMPLITUDE: 13 mv
RV LEAD THRESHOLD: 1.125 V

## 2012-10-08 LAB — TSH: TSH: 2.4 u[IU]/mL (ref 0.35–5.50)

## 2012-10-08 NOTE — Patient Instructions (Addendum)
Your physician wants you to follow-up in: 6 months with Dr Graciela Husbands. You will receive a reminder letter in the mail two months in advance. If you don't receive a letter, please call our office to schedule the follow-up appointment.  Your physician recommends that you return for lab work in: BMP, LFT, TSH

## 2012-10-08 NOTE — Assessment & Plan Note (Signed)
Stable post implant;  undelrying escape

## 2012-10-08 NOTE — Assessment & Plan Note (Signed)
No intercurrent afib  Will check tsh and lfts on amiodarone Hospital labs reviewed

## 2012-10-08 NOTE — Progress Notes (Signed)
Patient Care Team: Kimber Relic, MD as PCP - General (Internal Medicine) Ernest Haber IV as Attending Physician (Cardiothoracic Surgery)   HPI  Barry Thompson is a 79 y.o. male Seen to establish pacemaker followup. He hadTAVR at [facility] complicated by heart block and the placement of a pacemaker. He had moderate nonobstructive coronary disease.  He also has paf on Rivaroxaban and amiodarone  He has done well  With minimal chest pain or shortness of breath  Past Medical History  Diagnosis Date  . Hypertension   . Hyperlipidemia   . Aortic stenosis     Moderately Severe by echo 07/2011  . 1St degree AV block   . Shortness of breath   . Angina   . Chronic kidney disease     renal insufficiency  . Pleural effusion     07/2011 - Left > Right - tapped (L) - Transudative  . Atrial fibrillation     07/2011 - amio and Rivaroxaban initiated - tee/dccv  . Diastolic CHF, chronic     07/2011 NL EF by echo  . Orthostatic hypotension   . Coronary artery disease 01/19/2012    Past Surgical History  Procedure Laterality Date  . Tee without cardioversion  07/17/2011    Procedure: TRANSESOPHAGEAL ECHOCARDIOGRAM (TEE);  Surgeon: Lewayne Bunting, MD;  Location: [facility] ENDOSCOPY;  Service: Cardiovascular;  Laterality: N/A;  With Cardioversion    Current Outpatient Prescriptions  Medication Sig Dispense Refill  . ALPRAZolam (XANAX) 0.25 MG tablet Take 0.25 mg by mouth 3 (three) times daily as needed.      Marland Kitchen amiodarone (PACERONE) 200 MG tablet Take 1 tablet (200 mg total) by mouth 2 (two) times daily.  30 tablet  6  . amLODipine (NORVASC) 10 MG tablet Take 10 mg by mouth daily.      Marland Kitchen atorvastatin (LIPITOR) 20 MG tablet Take 1 tablet (20 mg total) by mouth daily.  30 tablet  6  . carvedilol (COREG) 25 MG tablet TAKE 2 TABLETS (50 MG TOTAL) BY MOUTH EVERY 12 (TWELVE) HOURS.  120 tablet  0  . cloNIDine (CATAPRES) 0.1 MG tablet Take 0.1 mg by mouth. 2 TABS TWICE DAILY      . cyanocobalamin 100 MCG  tablet Take 100 mcg by mouth daily.      Marland Kitchen docusate sodium (COLACE) 100 MG capsule Take 100 mg by mouth daily.      . furosemide (LASIX) 40 MG tablet Take 40 mg by mouth daily.      Marland Kitchen labetalol (NORMODYNE) 100 MG tablet Take 100 mg by mouth 2 (two) times daily.      Marland Kitchen levothyroxine (SYNTHROID, LEVOTHROID) 100 MCG tablet Take 100 mcg by mouth daily.      Marland Kitchen losartan (COZAAR) 100 MG tablet Take 1 tablet by mouth daily.      Marland Kitchen losartan-hydrochlorothiazide (HYZAAR) 100-12.5 MG per tablet Take 1 tablet by mouth daily.      . Multiple Vitamin (MULTIVITAMIN) capsule Take 1 capsule by mouth daily.        . potassium chloride SA (K-DUR,KLOR-CON) 20 MEQ tablet Take 1 tablet (20 mEq total) by mouth daily.  90 tablet  3  . Rivaroxaban (XARELTO) 15 MG TABS tablet Take 1 tablet (15 mg total) by mouth daily.  30 tablet  6  . spironolactone (ALDACTONE) 100 MG tablet Take 100 mg by mouth daily.      Marland Kitchen zolpidem (AMBIEN) 5 MG tablet Take 1 tablet by mouth daily.  No current facility-administered medications for this visit.    No Known Allergies  Review of Systems negative except from HPI and PMH  Physical Exam BP 156/82  Pulse 82  Ht 6\' 1"  (1.854 m)  Wt 205 lb (92.987 kg)  BMI 27.05 kg/m2 Well developed and well nourished in no acute distress HENT normal E scleral and icterus clear Neck Supple Device pocket well healed; without hematoma or erythema There is some thethering JVP flat; carotids brisk and full Clear to ausculation  Regular rate and rhythm, no murmurs gallops or rub Soft with active bowel sounds No clubbing cyanosis none Edema Alert and oriented, grossly normal motor and sensory function Skin Warm and Dry    Assessment and  Plan

## 2012-10-16 ENCOUNTER — Other Ambulatory Visit: Payer: Self-pay | Admitting: Internal Medicine

## 2012-11-02 ENCOUNTER — Ambulatory Visit (INDEPENDENT_AMBULATORY_CARE_PROVIDER_SITE_OTHER): Payer: Medicare Other | Admitting: Internal Medicine

## 2012-11-02 VITALS — BP 132/80 | HR 86 | Temp 97.5°F | Resp 16 | Ht 69.8 in | Wt 203.4 lb

## 2012-11-02 DIAGNOSIS — H6121 Impacted cerumen, right ear: Secondary | ICD-10-CM

## 2012-11-02 DIAGNOSIS — H6122 Impacted cerumen, left ear: Secondary | ICD-10-CM

## 2012-11-02 DIAGNOSIS — H6120 Impacted cerumen, unspecified ear: Secondary | ICD-10-CM

## 2012-11-02 NOTE — Progress Notes (Signed)
Bilateral ears flushed, large amount of wax removed from left ear/ small amount removed from right ear.

## 2012-11-02 NOTE — Progress Notes (Signed)
  Subjective:    Patient ID: Barry Thompson, male    DOB: 01/23/1924, 79 y.o.   MRN: [id number]  HPI sent in by [facility] for cerumen impaction which is complicating hearing aid fitting He has 100% hearing loss left ear and 70% hearing loss in the right ear    Review of Systems     Objective:   Physical Exam Stable vital signs Right auditory canal completely impacted Left auditory canal 50% impacted  Bilateral cerumen impaction relieved by irrigation Post impaction observation reveals intact tympanic membranes       Assessment & Plan:  Problem #1 hearing loss Problem #2 cerumen impaction  Followup with primary care and with [facility]

## 2012-11-18 ENCOUNTER — Ambulatory Visit: Payer: Medicare Other | Admitting: Cardiovascular Disease

## 2012-11-30 ENCOUNTER — Other Ambulatory Visit: Payer: Self-pay | Admitting: *Deleted

## 2012-11-30 MED ORDER — CARVEDILOL 25 MG PO TABS: ORAL_TABLET | ORAL | Status: DC

## 2012-12-11 ENCOUNTER — Other Ambulatory Visit: Payer: Self-pay | Admitting: Internal Medicine

## 2012-12-13 ENCOUNTER — Other Ambulatory Visit: Payer: Self-pay | Admitting: Internal Medicine

## 2012-12-18 ENCOUNTER — Other Ambulatory Visit: Payer: Self-pay | Admitting: Internal Medicine

## 2013-01-03 ENCOUNTER — Other Ambulatory Visit: Payer: Medicare Other

## 2013-01-03 DIAGNOSIS — R0602 Shortness of breath: Secondary | ICD-10-CM

## 2013-01-03 DIAGNOSIS — I359 Nonrheumatic aortic valve disorder, unspecified: Secondary | ICD-10-CM

## 2013-01-03 DIAGNOSIS — D649 Anemia, unspecified: Secondary | ICD-10-CM

## 2013-01-03 DIAGNOSIS — E039 Hypothyroidism, unspecified: Secondary | ICD-10-CM

## 2013-01-03 DIAGNOSIS — R609 Edema, unspecified: Secondary | ICD-10-CM

## 2013-01-03 DIAGNOSIS — I10 Essential (primary) hypertension: Secondary | ICD-10-CM

## 2013-01-04 ENCOUNTER — Encounter: Payer: Self-pay | Admitting: *Deleted

## 2013-01-04 LAB — BASIC METABOLIC PANEL
CO2: 26 mmol/L (ref 18–29)
Calcium: 9.4 mg/dL (ref 8.6–10.2)
Chloride: 103 mmol/L (ref 97–108)
Creatinine, Ser: 1.68 mg/dL — ABNORMAL HIGH (ref 0.76–1.27)
Potassium: 4.5 mmol/L (ref 3.5–5.2)
Sodium: 140 mmol/L (ref 134–144)

## 2013-01-04 LAB — CBC WITH DIFFERENTIAL/PLATELET
Basos: 1 % (ref 0–3)
Eos: 2 % (ref 0–5)
Hemoglobin: 12.3 g/dL — ABNORMAL LOW (ref 12.6–17.7)
Immature Grans (Abs): 0 10*3/uL (ref 0.0–0.1)
Lymphs: 22 % (ref 14–46)
Monocytes: 9 % (ref 4–12)
Neutrophils Absolute: 3.8 10*3/uL (ref 1.4–7.0)
RBC: 4.32 x10E6/uL (ref 4.14–5.80)
WBC: 5.8 10*3/uL (ref 3.4–10.8)

## 2013-01-04 LAB — TSH: TSH: 4.55 u[IU]/mL — ABNORMAL HIGH (ref 0.450–4.500)

## 2013-01-04 LAB — LIPID PANEL: HDL: 36 mg/dL — ABNORMAL LOW (ref >39)

## 2013-01-05 ENCOUNTER — Encounter: Payer: Self-pay | Admitting: Internal Medicine

## 2013-01-05 ENCOUNTER — Ambulatory Visit (INDEPENDENT_AMBULATORY_CARE_PROVIDER_SITE_OTHER): Payer: Medicare Other | Admitting: Internal Medicine

## 2013-01-05 VITALS — BP 144/74 | HR 60 | Temp 98.1°F | Resp 18 | Ht 72.0 in | Wt 204.4 lb

## 2013-01-05 DIAGNOSIS — N182 Chronic kidney disease, stage 2 (mild): Secondary | ICD-10-CM

## 2013-01-05 DIAGNOSIS — R350 Frequency of micturition: Secondary | ICD-10-CM

## 2013-01-05 DIAGNOSIS — E782 Mixed hyperlipidemia: Secondary | ICD-10-CM

## 2013-01-05 DIAGNOSIS — Z952 Presence of prosthetic heart valve: Secondary | ICD-10-CM

## 2013-01-05 DIAGNOSIS — I059 Rheumatic mitral valve disease, unspecified: Secondary | ICD-10-CM

## 2013-01-05 DIAGNOSIS — C61 Malignant neoplasm of prostate: Secondary | ICD-10-CM

## 2013-01-05 DIAGNOSIS — Z954 Presence of other heart-valve replacement: Secondary | ICD-10-CM

## 2013-01-05 DIAGNOSIS — I10 Essential (primary) hypertension: Secondary | ICD-10-CM

## 2013-01-05 DIAGNOSIS — Z950 Presence of cardiac pacemaker: Secondary | ICD-10-CM

## 2013-01-05 DIAGNOSIS — I4891 Unspecified atrial fibrillation: Secondary | ICD-10-CM

## 2013-01-05 DIAGNOSIS — R351 Nocturia: Secondary | ICD-10-CM

## 2013-01-05 NOTE — Patient Instructions (Signed)
Try firm support stockings (knee high). Continue medications as listed.

## 2013-01-05 NOTE — Progress Notes (Signed)
Subjective:    Patient ID: Barry Thompson, male    DOB: 24-Jun-1924, 79 y.o.   MRN: [id number]  HPI  Has edema. Better overnight. Worse after he is up a while.  Chronic kidney disease, stage II (mild) - Plan: CMP  Nocturia : Has nocturia and frequency. Voids every 90 minutes.Does not need to void as frequently in the day. Would like to try a medication to help.  Urine frequency  Malignant neoplasm of prostate: Status post surgery 2006.   Mixed hyperlipidemia: Controlled  HTN (hypertension): Controlled  Mitral valve disease:  chronic mitral valve disease. His aortic valve posed the greatest problems. He is status post aortic valve replacement and feels much better since his surgery.   Atrial fibrillation : Rate control   Pacemaker-Medtronic: Currently working well. Followed by Dr. Graciela Husbands.  S/P aortic valve replacement: Previous dyspnea and fatigability was improved following his AVR.    Current Outpatient Prescriptions on File Prior to Visit  Medication Sig Dispense Refill  . ALPRAZolam (XANAX) 0.25 MG tablet TAKE 1 TABLET BY MOUTH UP TO 3 TIMES DAILY AS NEEDED FOR NERVES  100 tablet  1  . ALPRAZolam (XANAX) 0.25 MG tablet TAKE 1 TABLET BY MOUTH UP TO 3 TIMES DAILY AS NEEDED FOR NERVES  100 tablet  1  . amiodarone (PACERONE) 200 MG tablet Take 1 tablet (200 mg total) by mouth 2 (two) times daily.  30 tablet  6  . amLODipine (NORVASC) 10 MG tablet Take 10 mg by mouth daily. Take 1 tablet daily to control blood pressure.      . carvedilol (COREG) 25 MG tablet Take 2 tablets every 12 hours for blood pressure  60 tablet  5  . cloNIDine (CATAPRES) 0.1 MG tablet Take 0.1 mg by mouth. Take 1 tablet twice daily to help with blood pressure.      . cyanocobalamin 100 MCG tablet Take 100 mcg by mouth daily.      Marland Kitchen docusate sodium (COLACE) 100 MG capsule Take 100 mg by mouth daily. Take 1 capsule daily to soften stools.      . furosemide (LASIX) 40 MG tablet TAKE 1 TABLET DAILY TO CONTROL  EDEMA  30 tablet  5  . labetalol (NORMODYNE) 100 MG tablet Take 100 mg by mouth 2 (two) times daily. Take one tablet twice daily to lower blood pressure and regulate heart rhythm.      Marland Kitchen losartan (COZAAR) 100 MG tablet Take 1 tablet by mouth daily. Take 1 tablet daily to control blood pressure.      Marland Kitchen losartan-hydrochlorothiazide (HYZAAR) 100-12.5 MG per tablet Take 1 tablet by mouth daily.      . Multiple Vitamin (MULTIVITAMIN) capsule Take 1 capsule by mouth daily. Take 1 tablet for vitamin supplement      . spironolactone (ALDACTONE) 100 MG tablet Take 100 mg by mouth daily.      Marland Kitchen zolpidem (AMBIEN) 5 MG tablet Take 1 tablet by mouth daily.       No current facility-administered medications on file prior to visit.    Review of Systems  Constitutional: Positive for fatigue. Negative for activity change, appetite change and unexpected weight change.  HENT: Positive for hearing loss and tinnitus. Negative for ear pain, congestion, neck pain and neck stiffness.   Eyes: Negative.   Respiratory:       History of dyspnea in the past typically with exertion. He feels he is doing better with his.  Cardiovascular: Negative for chest pain, palpitations and  leg swelling.       History of aortic valve replacement. History of pacemaker implantation.  Gastrointestinal: Positive for constipation.  Endocrine: Negative.   Genitourinary:       History prostate cancer, urinary frequency, and urgency. He is impotent. He has nocturia x2-3. In the daytime he voids every 90 minutes.  Musculoskeletal: Negative.   Skin: Negative.   Neurological: Positive for tremors. Negative for syncope, weakness and numbness.       Mild forgetfulness.  Hematological: Negative.   Psychiatric/Behavioral: The patient is nervous/anxious.        Objective:BP 144/74  Pulse 60  Temp(Src) 98.1 F (36.7 C)  Resp 18  Ht 6' (1.829 m)  Wt 204 lb 6.4 oz (92.715 kg)  BMI 27.72 kg/m2    Physical Exam  Constitutional: He is  oriented to person, place, and time. He appears well-developed and well-nourished. No distress.  HENT:  Head: Normocephalic and atraumatic.  Right Ear: External ear normal.  Left Ear: External ear normal.  Nose: Nose normal.  Mouth/Throat: Oropharynx is clear and moist.  Partial deafness bilaterally.  Eyes: Conjunctivae and EOM are normal. Pupils are equal, round, and reactive to light.  Wears corrective lenses.  Neck: No JVD present. No tracheal deviation present. No thyromegaly present.  Cardiovascular: Normal rate, regular rhythm and intact distal pulses.  Exam reveals no gallop and no friction rub.   2/6 systolic ejection murmur at the base of the heart. Pacemaker left upper anterior chest.  Pulmonary/Chest:  None rales audible posteriorly in both lungs. Breathing is even and unlabored at rest.  Abdominal: He exhibits no distension and no mass. There is no tenderness.  Musculoskeletal: Normal range of motion. He exhibits no edema and no tenderness.  Lymphadenopathy:    He has no cervical adenopathy.  Neurological: He is alert and oriented to person, place, and time. No cranial nerve deficit. Coordination normal.  Resting tremor bilaterally. Worse on the right side. 01/17/2009 MMSE 25/30. Passed clock drawing.  Skin: No rash noted. No erythema. No pallor.  Multiple seborrheic keratoses. Common Nevus left neck posterior to the ear.  Psychiatric: He has a normal mood and affect. His behavior is normal. Judgment and thought content normal.      Appointment on 01/03/2013  Component Date Value Range Status  . Glucose 01/03/2013 92  65 - 99 mg/dL Final  . BUN 16/04/9603 21  8 - 27 mg/dL Final  . Creatinine, Ser 01/03/2013 1.68* 0.76 - 1.27 mg/dL Final  . GFR calc non Af Amer 01/03/2013 36* >59 mL/min/1.73 Final  . GFR calc Af Amer 01/03/2013 42* >59 mL/min/1.73 Final  . BUN/Creatinine Ratio 01/03/2013 13  10 - 22 Final  . Sodium 01/03/2013 140  134 - 144 mmol/L Final  . Potassium  01/03/2013 4.5  3.5 - 5.2 mmol/L Final  . Chloride 01/03/2013 103  97 - 108 mmol/L Final  . CO2 01/03/2013 26  18 - 29 mmol/L Final                 **Please note reference interval change**  . Calcium 01/03/2013 9.4  8.6 - 10.2 mg/dL Final  . Cholesterol, Total 01/03/2013 116  100 - 199 mg/dL Final  . Triglycerides 01/03/2013 90  0 - 149 mg/dL Final  . HDL 54/03/8118 36* >39 mg/dL Final   Comment: According to ATP-III Guidelines, HDL-C >59 mg/dL is considered a  negative risk factor for CHD.  Marland Kitchen VLDL Cholesterol Cal 01/03/2013 18  5 - 40 mg/dL Final  . LDL Calculated 01/03/2013 62  0 - 99 mg/dL Final  . Chol/HDL Ratio 01/03/2013 3.2  0.0 - 5.0 ratio units Final  . TSH 01/03/2013 4.550* 0.450 - 4.500 uIU/mL Final  . COMMENT 01/03/2013 Comment   Final   Comment: The Endocrine Society recommends against routine treatment for                          patients with elevated TSH levels below 10.000 uIU/mL if free T4 or                          T4 are normal.  Thyroid function should be monitored at 6 to 12                          month intervals. Women who are pregnant or hope to become pregnant                          in the near future deserve special consideration.  . WBC 01/03/2013 5.8  3.4 - 10.8 x10E3/uL Final  . RBC 01/03/2013 4.32  4.14 - 5.80 x10E6/uL Final  . Hemoglobin 01/03/2013 12.3* 12.6 - 17.7 g/dL Final  . HCT 96/10/5407 37.5  37.5 - 51.0 % Final  . MCV 01/03/2013 87  79 - 97 fL Final  . MCH 01/03/2013 28.5  26.6 - 33.0 pg Final  . MCHC 01/03/2013 32.8  31.5 - 35.7 g/dL Final  . RDW 81/19/1478 14.8  12.3 - 15.4 % Final  . Neutrophils Relative % 01/03/2013 66  40 - 74 % Final  . Lymphs 01/03/2013 22  14 - 46 % Final  . Monocytes 01/03/2013 9  4 - 12 % Final  . Eos 01/03/2013 2  0 - 5 % Final  . Basos 01/03/2013 1  0 - 3 % Final  . Neutrophils Absolute 01/03/2013 3.8  1.4 - 7.0 x10E3/uL Final  . Lymphocytes Absolute 01/03/2013 1.3  0.7 - 3.1 x10E3/uL  Final  . Monocytes Absolute 01/03/2013 0.5  0.1 - 0.9 x10E3/uL Final  . Eosinophils Absolute 01/03/2013 0.1  0.0 - 0.4 x10E3/uL Final  . Basophils Absolute 01/03/2013 0.0  0.0 - 0.2 x10E3/uL Final  . Immature Granulocytes 01/03/2013 0  0 - 2 % Final  . Immature Grans (Abs) 01/03/2013 0.0  0.0 - 0.1 x10E3/uL Final  Office Visit on 10/08/2012  Component Date Value Range Status  . DEVICE MODEL PM 10/08/2012 [id number]   Final-Edited  . DEV-[id number] 10/08/2012 Duke Salvia  M.D.   Final-Edited  . DEV-0014LDO 10/08/2012 Duke Salvia  M.D.   Final-Edited  . PACEART TECH NOTES PM 10/08/2012    Final-Edited                   Value:Pacemaker check in clinic. Normal device function. Thresholds, sensing, impedances consistent with previous measurements. Device programmed to maximize longevity. No mode switch or high ventricular rates noted. Device programmed at appropriate safety                          margins. Histogram distribution appropriate for patient activity level. Device programmed to optimize intrinsic conduction. Estimated longevity 8.5 years. Patient education completed.  ROV 6  months with the device clinic.  . ATRIAL PACING PM 10/08/2012 99.87   Final-Edited  . VENTRICULAR PACING PM 10/08/2012 99.99   Final-Edited  . BATTERY VOLTAGE 10/08/2012 3.0178   Final-Edited  . AL IMPEDENCE PM 10/08/2012 475   Final-Edited  . RV LEAD IMPEDENCE PM 10/08/2012 513   Final-Edited  . AL AMPLITUDE 10/08/2012 3.125   Final-Edited  . RV LEAD AMPLITUDE 10/08/2012 13   Final-Edited  . AL THRESHOLD 10/08/2012 1   Final-Edited  . RV LEAD THRESHOLD 10/08/2012 1.125   Final-Edited  . BAMS-0001 10/08/2012 170   Final-Edited  . Sodium 10/08/2012 135  135 - 145 mEq/L Final  . Potassium 10/08/2012 4.5  3.5 - 5.1 mEq/L Final  . Chloride 10/08/2012 100  96 - 112 mEq/L Final  . CO2 10/08/2012 29  19 - 32 mEq/L Final  . Glucose, Bld 10/08/2012 122* 70 - 99 mg/dL Final  . BUN 56/21/3086 13  6 - 23 mg/dL Final   . Creatinine, Ser 10/08/2012 1.3  0.4 - 1.5 mg/dL Final  . Calcium 57/84/6962 9.2  8.4 - 10.5 mg/dL Final  . GFR 95/28/4132 58.14* >60.00 mL/min Final  . Total Bilirubin 10/08/2012 0.5  0.3 - 1.2 mg/dL Final  . Bilirubin, Direct 10/08/2012 0.1  0.0 - 0.3 mg/dL Final  . Alkaline Phosphatase 10/08/2012 44  39 - 117 U/L Final  . AST 10/08/2012 24  0 - 37 U/L Final  . ALT 10/08/2012 25  0 - 53 U/L Final  . Total Protein 10/08/2012 6.6  6.0 - 8.3 g/dL Final  . Albumin 44/07/270 3.8  3.5 - 5.2 g/dL Final  . TSH 53/66/4403 2.40  0.35 - 5.50 uIU/mL Final       Assessment & Plan:  Chronic kidney disease, stage II (mild): Recent BUN normal and creatinine 1.68. No additional treatment needed at this time. Continue to follow routinely.   - Plan: CMP  Nocturia: I suspect this is related to his previous problems with prostate cancer and postsurgical problems.  Urine frequency: Likely related to previous prostate cancer.  Malignant neoplasm of prostate: Status post surgery. No evidence for recurrent cancer.  Mixed hyperlipidemia: Stable and controlled  HTN (hypertension): Controlled  Mitral valve disease: I don't think this is a physiologically active problem. Murmur is present for mitral insufficiency.  Atrial fibrillation  : Rate controlled.  Pacemaker-Medtronic: Functioning well.  S/P aortic valve replacement: Dyspnea and fatigue improved following surgery.

## 2013-01-10 ENCOUNTER — Other Ambulatory Visit: Payer: Self-pay | Admitting: Internal Medicine

## 2013-02-01 ENCOUNTER — Telehealth: Payer: Self-pay | Admitting: Geriatric Medicine

## 2013-02-01 NOTE — Telephone Encounter (Signed)
Coreg 25 mg: 2 tablets every 12 hours

## 2013-02-01 NOTE — Telephone Encounter (Signed)
Spoke with pharmacy. Error will be corrected.

## 2013-02-01 NOTE — Telephone Encounter (Signed)
Pharmacy called and stated that there was a misfill back in May. They filled Coreg at 6.25 mg two tablets every 12 hours. It is supposed to be 25 mg two tablets every 12 hours. He was seen here on 01/05/13 and his bp was 144/74. The prescription was supposed to be for a 30 day supply, but it lasted the patient 60 days. The pharmacy called to inform us of the mistake and to ask how this prescription should be filled in the future. Please advise, thanks.

## 2013-02-10 ENCOUNTER — Other Ambulatory Visit: Payer: Self-pay | Admitting: Cardiovascular Disease

## 2013-02-10 ENCOUNTER — Other Ambulatory Visit (HOSPITAL_COMMUNITY): Payer: Self-pay | Admitting: Cardiovascular Disease

## 2013-02-17 ENCOUNTER — Other Ambulatory Visit: Payer: Self-pay | Admitting: Internal Medicine

## 2013-02-28 ENCOUNTER — Other Ambulatory Visit: Payer: Self-pay | Admitting: Internal Medicine

## 2013-03-10 ENCOUNTER — Other Ambulatory Visit: Payer: Self-pay | Admitting: Internal Medicine

## 2013-03-10 DIAGNOSIS — R351 Nocturia: Secondary | ICD-10-CM | POA: Insufficient documentation

## 2013-03-10 DIAGNOSIS — R350 Frequency of micturition: Secondary | ICD-10-CM | POA: Insufficient documentation

## 2013-03-10 DIAGNOSIS — Z952 Presence of prosthetic heart valve: Secondary | ICD-10-CM | POA: Insufficient documentation

## 2013-03-10 DIAGNOSIS — N182 Chronic kidney disease, stage 2 (mild): Secondary | ICD-10-CM | POA: Insufficient documentation

## 2013-04-04 ENCOUNTER — Other Ambulatory Visit: Payer: Self-pay | Admitting: Internal Medicine

## 2013-04-09 ENCOUNTER — Other Ambulatory Visit: Payer: Self-pay | Admitting: Internal Medicine

## 2013-04-11 ENCOUNTER — Other Ambulatory Visit: Payer: Medicare Other

## 2013-04-11 DIAGNOSIS — N182 Chronic kidney disease, stage 2 (mild): Secondary | ICD-10-CM

## 2013-04-12 LAB — COMPREHENSIVE METABOLIC PANEL
Albumin/Globulin Ratio: 2.3 (ref 1.1–2.5)
Albumin: 4.2 g/dL (ref 3.5–4.7)
BUN: 50 mg/dL — ABNORMAL HIGH (ref 8–27)
CO2: 27 mmol/L (ref 18–29)
Calcium: 9.3 mg/dL (ref 8.6–10.2)
Creatinine, Ser: 2.01 mg/dL — ABNORMAL HIGH (ref 0.76–1.27)
GFR calc non Af Amer: 29 mL/min/{1.73_m2} — ABNORMAL LOW (ref >59)
Globulin, Total: 1.8 g/dL (ref 1.5–4.5)
Sodium: 143 mmol/L (ref 134–144)

## 2013-04-13 ENCOUNTER — Encounter: Payer: Self-pay | Admitting: Internal Medicine

## 2013-04-13 ENCOUNTER — Ambulatory Visit (INDEPENDENT_AMBULATORY_CARE_PROVIDER_SITE_OTHER): Payer: Medicare Other | Admitting: Internal Medicine

## 2013-04-13 VITALS — BP 118/50 | HR 61 | Temp 97.2°F | Wt 191.0 lb

## 2013-04-13 DIAGNOSIS — R0609 Other forms of dyspnea: Secondary | ICD-10-CM

## 2013-04-13 DIAGNOSIS — I509 Heart failure, unspecified: Secondary | ICD-10-CM

## 2013-04-13 DIAGNOSIS — Z950 Presence of cardiac pacemaker: Secondary | ICD-10-CM

## 2013-04-13 DIAGNOSIS — Z23 Encounter for immunization: Secondary | ICD-10-CM

## 2013-04-13 DIAGNOSIS — I4891 Unspecified atrial fibrillation: Secondary | ICD-10-CM

## 2013-04-13 DIAGNOSIS — Z952 Presence of prosthetic heart valve: Secondary | ICD-10-CM

## 2013-04-13 DIAGNOSIS — Z959 Presence of cardiac and vascular implant and graft, unspecified: Secondary | ICD-10-CM

## 2013-04-13 DIAGNOSIS — R5381 Other malaise: Secondary | ICD-10-CM

## 2013-04-13 DIAGNOSIS — E039 Hypothyroidism, unspecified: Secondary | ICD-10-CM

## 2013-04-13 DIAGNOSIS — I10 Essential (primary) hypertension: Secondary | ICD-10-CM

## 2013-04-13 DIAGNOSIS — R0989 Other specified symptoms and signs involving the circulatory and respiratory systems: Secondary | ICD-10-CM

## 2013-04-13 DIAGNOSIS — R0600 Dyspnea, unspecified: Secondary | ICD-10-CM

## 2013-04-13 DIAGNOSIS — N184 Chronic kidney disease, stage 4 (severe): Secondary | ICD-10-CM

## 2013-04-13 DIAGNOSIS — R7309 Other abnormal glucose: Secondary | ICD-10-CM

## 2013-04-13 DIAGNOSIS — I951 Orthostatic hypotension: Secondary | ICD-10-CM

## 2013-04-13 DIAGNOSIS — Z954 Presence of other heart-valve replacement: Secondary | ICD-10-CM

## 2013-04-13 DIAGNOSIS — I5032 Chronic diastolic (congestive) heart failure: Secondary | ICD-10-CM

## 2013-04-13 DIAGNOSIS — I2510 Atherosclerotic heart disease of native coronary artery without angina pectoris: Secondary | ICD-10-CM

## 2013-04-13 DIAGNOSIS — R739 Hyperglycemia, unspecified: Secondary | ICD-10-CM

## 2013-04-13 MED ORDER — AMIODARONE HCL 200 MG PO TABS: ORAL_TABLET | ORAL | Status: DC

## 2013-04-13 NOTE — Patient Instructions (Addendum)
Stop Furosemide, spironolactone, amlodipine, losartan, losartan-HCTZ, clonidine and potassium.

## 2013-04-13 NOTE — Progress Notes (Signed)
Subjective:    Patient ID: Barry Thompson, male    DOB: 05/09/1924, 79 y.o.   MRN: [id number]  HPI SO with minimal exertion. Fatigues easier than usual. Stools are darker than usual, but are not black. Denies chest pain or rapid palpitations. Poor appetite. Using Ensure supplements. Denies abd pain or reflux.   HTN (hypertension): BP running lower than usual  Chronic kidney disease, stage IV (severe): rechecklab  Hyperglycemia: improved  Pacemaker-Medtronic: functioning appropriately  S/P aortic valve replacement: on Xarelto  Atrial fibrillation:currently on amiodarone (PACERONE) 200 MG tablet. Also taking mtoprolol  Dyspnea: multiple potential etiologies  Coronary artery disease: stable  Orthostatic hypotension: currently on multiple medications that can lower BP  Other malaise and fatigue: due to pallor, I worry about potential for anemia. He has hx of CHF and is on amiodarone.  Unspecified hypothyroidism: no recent change in levothyroxine dose  Chronic diastolic congestive heart failure: dyspneic, but there is no edema and lungs sound clear  Cardiac device in situ;acer    Current Outpatient Prescriptions on File Prior to Visit  Medication Sig Dispense Refill  . ALPRAZolam (XANAX) 0.25 MG tablet TAKE 1 TABLET BY MOUTH UP TO 3 TIMES DAILY AS NEEDED FOR NERVES  100 tablet  1  . amiodarone (PACERONE) 200 MG tablet TAKE 1 TABLET (200 MG TOTAL) BY MOUTH DAILY.  30 tablet  9  . amLODipine (NORVASC) 10 MG tablet TAKE 1 TABLET (10 MG TOTAL) BY MOUTH DAILY.  30 tablet  9  . atorvastatin (LIPITOR) 20 MG tablet TAKE 1 TABLET BY MOUTH EVERY DAY  30 tablet  5  . carvedilol (COREG) 25 MG tablet Take 2 tablets every 12 hours for blood pressure  60 tablet  5  . [facility] ASPIRIN LOW DOSE 81 MG EC tablet TAKE 1 TABLET (81 MG TOTAL) BY MOUTH DAILY.  30 tablet  1  . cyanocobalamin 100 MCG tablet Take 100 mcg by mouth daily.      . furosemide (LASIX) 40 MG tablet TAKE 1 TABLET DAILY TO CONTROL  EDEMA  30 tablet  5  . levothyroxine (SYNTHROID, LEVOTHROID) 100 MCG tablet TAKE 1 TABLET EVERY DAY FOR THYROID SUPPLEMENT  30 tablet  5  . Multiple Vitamin (MULTIVITAMIN) capsule Take 1 capsule by mouth daily. Take 1 tablet for vitamin supplement      . potassium chloride SA (K-DUR,KLOR-CON) 20 MEQ tablet TAKE 1 TABLET BY MOUTH DAILY.  90 tablet  1  . XARELTO 15 MG TABS tablet TAKE 1 TABLET EVERY DAY  30 tablet  2  . zolpidem (AMBIEN) 10 MG tablet TAKE 1 TABLET BY MOUTH AT BEDTIME AS NEEDED FOR SLEEP  30 tablet  5  . cloNIDine (CATAPRES) 0.1 MG tablet Take 0.1 mg by mouth. Take 1 tablet twice daily to help with blood pressure.      . labetalol (NORMODYNE) 100 MG tablet Take 100 mg by mouth 2 (two) times daily. Take one tablet twice daily to lower blood pressure and regulate heart rhythm.      Marland Kitchen losartan (COZAAR) 100 MG tablet Take 1 tablet by mouth daily. Take 1 tablet daily to control blood pressure.      Marland Kitchen losartan-hydrochlorothiazide (HYZAAR) 100-12.5 MG per tablet Take 1 tablet by mouth daily.      Marland Kitchen zolpidem (AMBIEN) 5 MG tablet Take 1 tablet by mouth daily.       No current facility-administered medications on file prior to visit.    Review of Systems  Constitutional: Positive for  fatigue. Negative for activity change, appetite change and unexpected weight change.  HENT: Positive for hearing loss and tinnitus. Negative for ear pain, congestion, neck pain and neck stiffness.   Eyes: Negative.   Respiratory: Positive for shortness of breath. Negative for chest tightness.        History of dyspnea in the past typically with exertion. He feels he is doing better with his.  Cardiovascular: Negative for chest pain, palpitations and leg swelling.       History of aortic valve replacement. History of pacemaker implantation.  Gastrointestinal: Positive for constipation. Negative for abdominal pain and abdominal distention.       Poor appetite.  Endocrine: Negative.   Genitourinary:        History prostate cancer, urinary frequency, and urgency. He is impotent. He has nocturia x2-3. In the daytime he voids every 90 minutes.  Musculoskeletal: Negative.   Skin: Positive for pallor. Negative for rash and wound.  Neurological: Positive for tremors. Negative for syncope, weakness and numbness.       Mild forgetfulness.  Hematological: Negative.   Psychiatric/Behavioral: The patient is nervous/anxious.        Objective:BP 118/50  Pulse 61  Temp(Src) 97.2 F (36.2 C) (Oral)  Wt 191 lb (86.637 kg)  BMI 25.9 kg/m2  SpO2 98%    Physical Exam  Constitutional: He is oriented to person, place, and time. He appears well-developed and well-nourished. No distress.  HENT:  Head: Normocephalic and atraumatic.  Right Ear: External ear normal.  Left Ear: External ear normal.  Nose: Nose normal.  Mouth/Throat: Oropharynx is clear and moist.  Partial deafness bilaterally.  Eyes: Conjunctivae and EOM are normal. Pupils are equal, round, and reactive to light.  Wears corrective lenses.  Neck: No JVD present. No tracheal deviation present. No thyromegaly present.  Cardiovascular: Normal rate, regular rhythm and intact distal pulses.  Exam reveals no gallop and no friction rub.   2/6 systolic ejection murmur at the base of the heart. Pacemaker left upper anterior chest. Audible systolic click.  Pulmonary/Chest: No respiratory distress. He has no wheezes. He exhibits no tenderness.  Breathing is even and unlabored at rest.  Abdominal: He exhibits no distension and no mass. There is no tenderness.  Musculoskeletal: Normal range of motion. He exhibits no edema and no tenderness.  Lymphadenopathy:    He has no cervical adenopathy.  Neurological: He is alert and oriented to person, place, and time. No cranial nerve deficit. Coordination normal.  Resting tremor bilaterally. Worse on the right side. 01/17/2009 MMSE 25/30. Passed clock drawing.  Skin: No rash noted. No erythema. There is  pallor.  Multiple seborrheic keratoses. Common Nevus left neck posterior to the ear.  Psychiatric: He has a normal mood and affect. His behavior is normal. Judgment and thought content normal.    Office Visit on 04/13/2013  Component Date Value Range Status  . WBC 04/13/2013 6.8  3.4 - 10.8 x10E3/uL Final  . RBC 04/13/2013 2.48* 4.14 - 5.80 x10E6/uL Final  . Hemoglobin 04/13/2013 6.8* 12.6 - 17.7 g/dL Final  . HCT 45/40/9811 21.5* 37.5 - 51.0 % Final  . MCV 04/13/2013 87  79 - 97 fL Final  . MCH 04/13/2013 27.4  26.6 - 33.0 pg Final  . MCHC 04/13/2013 31.6  31.5 - 35.7 g/dL Final  . RDW 91/47/8295 15.8* 12.3 - 15.4 % Final  . Neutrophils Relative % 04/13/2013 72   Final  . Lymphs 04/13/2013 18   Final  . Monocytes 04/13/2013  10   Final  . Eos 04/13/2013 0   Final  . Basos 04/13/2013 0   Final  . Neutrophils Absolute 04/13/2013 4.8  1.4 - 7.0 x10E3/uL Final  . Lymphocytes Absolute 04/13/2013 1.2  0.7 - 3.1 x10E3/uL Final  . Monocytes Absolute 04/13/2013 0.7  0.1 - 0.9 x10E3/uL Final  . Eosinophils Absolute 04/13/2013 0.0  0.0 - 0.4 x10E3/uL Final  . Basophils Absolute 04/13/2013 0.0  0.0 - 0.2 x10E3/uL Final  . Immature Granulocytes 04/13/2013 0   Final  . Immature Grans (Abs) 04/13/2013 0.0  0.0 - 0.1 x10E3/uL Final  . BNP 04/13/2013 WILL FOLLOW   Preliminary  . TSH 04/13/2013 0.049* 0.450 - 4.500 uIU/mL Final  Appointment on 04/11/2013  Component Date Value Range Status  . Glucose 04/11/2013 111* 65 - 99 mg/dL Final  . BUN 45/40/9811 50* 8 - 27 mg/dL Final  . Creatinine, Ser 04/11/2013 2.01* 0.76 - 1.27 mg/dL Final  . GFR calc non Af Amer 04/11/2013 29* >59 mL/min/1.73 Final  . GFR calc Af Amer 04/11/2013 34* >59 mL/min/1.73 Final  . BUN/Creatinine Ratio 04/11/2013 25* 10 - 22 Final  . Sodium 04/11/2013 143  134 - 144 mmol/L Final  . Potassium 04/11/2013 4.5  3.5 - 5.2 mmol/L Final  . Chloride 04/11/2013 102  97 - 108 mmol/L Final  . CO2 04/11/2013 27  18 - 29 mmol/L Final   . Calcium 04/11/2013 9.3  8.6 - 10.2 mg/dL Final  . Total Protein 04/11/2013 6.0  6.0 - 8.5 g/dL Final  . Albumin 91/47/8295 4.2  3.5 - 4.7 g/dL Final  . Globulin, Total 04/11/2013 1.8  1.5 - 4.5 g/dL Final  . Albumin/Globulin Ratio 04/11/2013 2.3  1.1 - 2.5 Final  . Total Bilirubin 04/11/2013 0.4  0.0 - 1.2 mg/dL Final  . Alkaline Phosphatase 04/11/2013 40  39 - 117 IU/L Final  . AST 04/11/2013 18  0 - 40 IU/L Final  . ALT 04/11/2013 24  0 - 44 IU/L Final         Assessment & Plan:  Need for prophylactic vaccination and inoculation against influenza: flu vaccine given  HTN (hypertension): stopped multiple medications due to lower BP  Chronic kidney disease, stage IV (severe): increased BUN and creat on recent lab  Hyperglycemia: stable  Pacemaker-Medtronic: functioning  S/P aortic valve replacement: unchanged  Atrial fibrillation   - Plan: continue metoprolol and amiodarone (PACERONE) 200 MG tablet  Dyspnea: most likely due to anemia  Coronary artery disease: stable  Orthostatic hypotension: stopped multiple medications  Other malaise and fatigue - Plan: CBC with Differential, Brain Natriuretic Peptide  Results returned 04/14/13 and show low Hgb at 6.8 and low TSH (likely due to amiodarone side effect)  Unspecified hypothyroidism - Plan: TSH  Low TSH result returned 04/14/13.   Due to very low Hgb and symptoms of dyspnea and exhaustion,, I recommended that he go to the ER for admission. He will likely need transfusion and adjustment of his levothyroxine. The situation is complicated by a GI bleed and his need for anticoagulation. Currently on Xarelto  Chronic diastolic congestive heart failure - Plan: Brain Natriuretic Peptide  Cardiac device in situ - Plan: Brain Natriuretic Peptide

## 2013-04-14 ENCOUNTER — Telehealth: Payer: Self-pay

## 2013-04-14 ENCOUNTER — Encounter (HOSPITAL_COMMUNITY): Payer: Self-pay | Admitting: *Deleted

## 2013-04-14 ENCOUNTER — Inpatient Hospital Stay (HOSPITAL_COMMUNITY)
Admission: EM | Admit: 2013-04-14 | Discharge: 2013-04-19 | DRG: 378 | Disposition: A | Discharge status: Home / Self Care | Payer: Medicare Other | Attending: Internal Medicine | Admitting: Internal Medicine

## 2013-04-14 DIAGNOSIS — I059 Rheumatic mitral valve disease, unspecified: Secondary | ICD-10-CM

## 2013-04-14 DIAGNOSIS — N184 Chronic kidney disease, stage 4 (severe): Secondary | ICD-10-CM

## 2013-04-14 DIAGNOSIS — R350 Frequency of micturition: Secondary | ICD-10-CM

## 2013-04-14 DIAGNOSIS — C61 Malignant neoplasm of prostate: Secondary | ICD-10-CM

## 2013-04-14 DIAGNOSIS — D689 Coagulation defect, unspecified: Secondary | ICD-10-CM

## 2013-04-14 DIAGNOSIS — I5032 Chronic diastolic (congestive) heart failure: Secondary | ICD-10-CM

## 2013-04-14 DIAGNOSIS — D649 Anemia, unspecified: Secondary | ICD-10-CM

## 2013-04-14 DIAGNOSIS — Z952 Presence of prosthetic heart valve: Secondary | ICD-10-CM

## 2013-04-14 DIAGNOSIS — I313 Pericardial effusion (noninflammatory): Secondary | ICD-10-CM

## 2013-04-14 DIAGNOSIS — I442 Atrioventricular block, complete: Secondary | ICD-10-CM | POA: Diagnosis present

## 2013-04-14 DIAGNOSIS — N19 Unspecified kidney failure: Secondary | ICD-10-CM

## 2013-04-14 DIAGNOSIS — E782 Mixed hyperlipidemia: Secondary | ICD-10-CM

## 2013-04-14 DIAGNOSIS — I2510 Atherosclerotic heart disease of native coronary artery without angina pectoris: Secondary | ICD-10-CM

## 2013-04-14 DIAGNOSIS — Z950 Presence of cardiac pacemaker: Secondary | ICD-10-CM

## 2013-04-14 DIAGNOSIS — K922 Gastrointestinal hemorrhage, unspecified: Principal | ICD-10-CM

## 2013-04-14 DIAGNOSIS — I4891 Unspecified atrial fibrillation: Secondary | ICD-10-CM | POA: Diagnosis present

## 2013-04-14 DIAGNOSIS — I509 Heart failure, unspecified: Secondary | ICD-10-CM

## 2013-04-14 DIAGNOSIS — I10 Essential (primary) hypertension: Secondary | ICD-10-CM | POA: Diagnosis present

## 2013-04-14 DIAGNOSIS — D500 Iron deficiency anemia secondary to blood loss (chronic): Secondary | ICD-10-CM | POA: Diagnosis present

## 2013-04-14 DIAGNOSIS — R351 Nocturia: Secondary | ICD-10-CM

## 2013-04-14 DIAGNOSIS — K921 Melena: Secondary | ICD-10-CM

## 2013-04-14 LAB — BASIC METABOLIC PANEL
CO2: 26 mEq/L (ref 19–32)
Chloride: 99 mEq/L (ref 96–112)
GFR calc non Af Amer: 27 mL/min — ABNORMAL LOW (ref >90)
Glucose, Bld: 123 mg/dL — ABNORMAL HIGH (ref 70–99)
Potassium: 4.4 mEq/L (ref 3.5–5.1)
Sodium: 136 mEq/L (ref 135–145)

## 2013-04-14 LAB — PREPARE RBC (CROSSMATCH)

## 2013-04-14 LAB — CBC WITH DIFFERENTIAL/PLATELET
Basos: 0 %
Eos: 0 %
Eosinophils Absolute: 0 10*3/uL (ref 0.0–0.4)
Eosinophils Absolute: 0.1 10*3/uL (ref 0.0–0.7)
Eosinophils Relative: 1 % (ref 0–5)
HCT: 19.5 % — ABNORMAL LOW (ref 39.0–52.0)
Hemoglobin: 6.8 g/dL — CL (ref 12.6–17.7)
Hemoglobin: 6.1 g/dL — CL (ref 13.0–17.0)
Immature Grans (Abs): 0 10*3/uL (ref 0.0–0.1)
Lymphocytes Absolute: 1.2 10*3/uL (ref 0.7–3.1)
Lymphocytes Relative: 19 % (ref 12–46)
Lymphs Abs: 1.1 10*3/uL (ref 0.7–4.0)
Lymphs: 18 %
MCH: 27.4 pg (ref 26.6–33.0)
MCH: 26.9 pg (ref 26.0–34.0)
MCHC: 31.6 g/dL (ref 31.5–35.7)
MCHC: 31.3 g/dL (ref 30.0–36.0)
MCV: 87 fL (ref 79–97)
MCV: 85.9 fL (ref 78.0–100.0)
Monocytes Relative: 14 % — ABNORMAL HIGH (ref 3–12)
Neutrophils Absolute: 4.8 10*3/uL (ref 1.4–7.0)
Neutrophils Relative %: 72 %
Platelets: 223 10*3/uL (ref 150–400)
RBC: 2.48 x10E6/uL — CL (ref 4.14–5.80)
RBC: 2.27 MIL/uL — ABNORMAL LOW (ref 4.22–5.81)
WBC: 6.8 10*3/uL (ref 3.4–10.8)
WBC: 5.9 10*3/uL (ref 4.0–10.5)

## 2013-04-14 LAB — ABO/RH: ABO/RH(D): O POS

## 2013-04-14 LAB — MRSA PCR SCREENING: MRSA by PCR: NEGATIVE

## 2013-04-14 LAB — PROTIME-INR
INR: 2.16 — ABNORMAL HIGH (ref 0.00–1.49)
Prothrombin Time: 23.4 seconds — ABNORMAL HIGH (ref 11.6–15.2)

## 2013-04-14 LAB — BRAIN NATRIURETIC PEPTIDE: BNP: 239.3 pg/mL — ABNORMAL HIGH (ref 0.0–100.0)

## 2013-04-14 MED ORDER — CARVEDILOL 25 MG PO TABS
25.0000 mg | ORAL_TABLET | Freq: Two times a day (BID) | ORAL | Status: DC
  Administered 2013-04-15 – 2013-04-19 (×9): 25 mg via ORAL
  Filled 2013-04-14 (×14): qty 1

## 2013-04-14 MED ORDER — LEVOTHYROXINE SODIUM 100 MCG PO TABS
100.0000 ug | ORAL_TABLET | Freq: Every day | ORAL | Status: DC
  Administered 2013-04-15 – 2013-04-19 (×5): 100 ug via ORAL
  Filled 2013-04-14 (×7): qty 1

## 2013-04-14 MED ORDER — ATORVASTATIN CALCIUM 20 MG PO TABS
20.0000 mg | ORAL_TABLET | Freq: Every day | ORAL | Status: DC
  Administered 2013-04-15 – 2013-04-18 (×4): 20 mg via ORAL
  Filled 2013-04-14 (×6): qty 1

## 2013-04-14 MED ORDER — CARVEDILOL 25 MG PO TABS: 25.0000 mg | ORAL_TABLET | Freq: Two times a day (BID) | ORAL | Status: DC

## 2013-04-14 MED ORDER — ALPRAZOLAM 0.5 MG PO TABS: 1.0000 mg | ORAL_TABLET | Freq: Three times a day (TID) | ORAL | Status: DC | PRN

## 2013-04-14 MED ORDER — SODIUM CHLORIDE 0.9 % IJ SOLN: 3.0000 mL | INTRAMUSCULAR | Status: DC | PRN

## 2013-04-14 MED ORDER — PANTOPRAZOLE SODIUM 40 MG IV SOLR
8.0000 mg/h | INTRAVENOUS | Status: DC
  Administered 2013-04-14 – 2013-04-15 (×3): 8 mg/h via INTRAVENOUS
  Filled 2013-04-14 (×6): qty 80

## 2013-04-14 MED ORDER — ACETAMINOPHEN 650 MG RE SUPP: 650.0000 mg | Freq: Four times a day (QID) | RECTAL | Status: DC | PRN

## 2013-04-14 MED ORDER — SODIUM CHLORIDE 0.9 % IJ SOLN
3.0000 mL | Freq: Two times a day (BID) | INTRAMUSCULAR | Status: DC
  Administered 2013-04-15: 3 mL via INTRAVENOUS

## 2013-04-14 MED ORDER — ZOLPIDEM TARTRATE 5 MG PO TABS
5.0000 mg | ORAL_TABLET | Freq: Every evening | ORAL | Status: DC | PRN
  Administered 2013-04-14 – 2013-04-18 (×5): 5 mg via ORAL
  Filled 2013-04-14 (×5): qty 1

## 2013-04-14 MED ORDER — PANTOPRAZOLE SODIUM 40 MG IV SOLR
40.0000 mg | Freq: Once | INTRAVENOUS | Status: AC
  Administered 2013-04-14: 40 mg via INTRAVENOUS
  Filled 2013-04-14: qty 40

## 2013-04-14 MED ORDER — SODIUM CHLORIDE 0.9 % IV SOLN: 250.0000 mL | INTRAVENOUS | Status: DC | PRN

## 2013-04-14 MED ORDER — AMIODARONE HCL 200 MG PO TABS
200.0000 mg | ORAL_TABLET | Freq: Every day | ORAL | Status: DC
  Administered 2013-04-14 – 2013-04-19 (×5): 200 mg via ORAL
  Filled 2013-04-14 (×6): qty 1

## 2013-04-14 MED ORDER — SODIUM CHLORIDE 0.9 % IV SOLN
INTRAVENOUS | Status: DC
  Administered 2013-04-14: 20 mL/h via INTRAVENOUS

## 2013-04-14 MED ORDER — ACETAMINOPHEN 325 MG PO TABS: 650.0000 mg | ORAL_TABLET | Freq: Four times a day (QID) | ORAL | Status: DC | PRN

## 2013-04-14 NOTE — Telephone Encounter (Signed)
Called patient, Dr. Chilton Si received his lab work and Hgb is 6.8, with his dyspnea and exhaustion, Dr. Chilton Si wants him to go to the ER, need transfusion. Have someone to drive him.

## 2013-04-14 NOTE — ED Notes (Signed)
Pt reports fatigue and sob for several days, went to pcp and had lab work done, told to come to ED due to abnormal results? Pt appears pale, airway intact.

## 2013-04-14 NOTE — Progress Notes (Signed)
Called patient, to go to ER today,  have someone to drive him.

## 2013-04-14 NOTE — Consult Note (Signed)
Referring Provider: Dr. 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 Hemoglobin here in the emergency room was 6.1. This patient's stool has been dark in recent weeks and rectal exam in the emergency room by the ED P. showed melenic character. The patient is on Xarelto because of a history of atrial fibrillation. He also has a bioprosthetic aortic valve replacement. He is also on aspirin, without PPI coverage.  Lab work is pertinent for an elevated BUN of 51 and creatinine of 2.1, as compared to a baseline in June of this year with a BUN of 21 and a creatinine of 1.7. At that time, his hemoglobin was 12.3, as compared to the current level of 6.1.  The patient's appetite has been somewhat poor since his wife had to be put in a nursing home, and he has had a 13 pound weight loss over the past several months. This may be situational rather than organic in origin. Apart from that, no ongoing upper GI tract symptoms such as dyspepsia, reflux, dysphagia, nausea, or chronic abdominal pain.   Past Medical History  Diagnosis Date  . Hypertension   . Hyperlipidemia   . Aortic stenosis     Moderately Severe by echo 07/2011  . 1St degree AV block   . Shortness of breath   . Angina   . Chronic kidney disease     renal insufficiency  . Pleural effusion     07/2011 - Left > Right - tapped (L) - Transudative  . Atrial fibrillation     07/2011 - amio and Rivaroxaban initiated - tee/dccv  . Diastolic CHF, chronic     07/2011 NL EF by echo  . Orthostatic hypotension   .  Coronary artery disease 01/19/2012  . Cardiac pacemaker in situ 06/01/2012  . Unspecified hypothyroidism 09/24/2011  . Anxiety state, unspecified 09/24/2011  . Unspecified cataract 02/04/2011  . Disturbance of salivary secretion 07/25/2009  . Edema 09/05/2008  . Shortness of breath 09/05/2008  . 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 Insomnia, unspecified 05/09/2005  . Urinary frequency 05/09/2005  . Malignant neoplasm of prostate 03/04/2005  . Hypertrophy of prostate without urinary obstruction and other lower urinary tract symptoms (LUTS) 02/12/2005  . Other malaise and fatigue 02/12/2005  . Nocturia 02/22/2003  . Disturbances of sensation of smell and taste 03/16/2001  . Other specified cardiac dysrhythmias(427.89) 11/19/1999    Past Surgical History  Procedure Laterality Date  . Tee without cardioversion  07/17/2011    Procedure: TRANSESOPHAGEAL ECHOCARDIOGRAM (TEE);  Surgeon: Lewayne Bunting, MD;  Location: [facility] ENDOSCOPY;  Service: Cardiovascular;  Laterality: N/A;  With Cardioversion  . Hemorroidectomy  1968  . Spine surgery  1993    HNP(RUPTURED DISC)John Crecencio Mc  . Prostate surgery  05/26/2005    Dr. Vonita Moss  . Eye surgery  2008    left eye ( Dr. Emmit Pomfret)    Prior to Admission medications   Medication Sig Start Date End Date Taking? Authorizing Provider  ALPRAZolam (XANAX) 0.25 MG tablet TAKE 1 TABLET BY MOUTH UP TO 3 TIMES DAILY AS NEEDED FOR  NERVES 12/13/12   Tiffany L Reed, DO  amiodarone (PACERONE) 200 MG tablet TAKE 1 TABLET (200 MG TOTAL) BY MOUTH DAILY to help control heart rhythm. 04/13/13   Kimber Relic, MD  atorvastatin (LIPITOR) 20 MG tablet TAKE 1 TABLET BY MOUTH EVERY DAY 02/28/13   Tiffany L Reed, DO  Bisacodyl (LAXATIVE PO) Take by mouth. OTC Laxative once daily    Historical Provider, MD  carvedilol (COREG) 25 MG tablet Take 2 tablets every 12 hours for blood pressure 11/30/12   Claudie Revering, NP  [facility] ASPIRIN LOW DOSE 81 MG EC tablet TAKE 1 TABLET (81  MG TOTAL) BY MOUTH DAILY. 04/09/13   Tiffany L Reed, DO  cyanocobalamin 100 MCG tablet Take 100 mcg by mouth daily.    Historical Provider, MD  levothyroxine (SYNTHROID, LEVOTHROID) 100 MCG tablet TAKE 1 TABLET EVERY DAY FOR THYROID SUPPLEMENT 01/10/13   Tiffany L Reed, DO  Multiple Vitamin (MULTIVITAMIN) capsule Take 1 capsule by mouth daily. Take 1 tablet for vitamin supplement    Historical Provider, MD  Omega-3 Fatty Acids (FISH OIL) 1000 MG CAPS Take by mouth 2 (two) times daily.    Historical Provider, MD  XARELTO 15 MG TABS tablet TAKE 1 TABLET EVERY DAY 03/10/13   Claudie Revering, NP  zolpidem (AMBIEN) 10 MG tablet TAKE 1 TABLET BY MOUTH AT BEDTIME AS NEEDED FOR SLEEP 02/17/13   Claudie Revering, NP    Current Facility-Administered Medications  Medication Dose Route Frequency Provider Last Rate Last Dose  . 0.9 %  sodium chloride infusion  250 mL Intravenous PRN Meredeth Ide, MD      . 0.9 %  sodium chloride infusion   Intravenous Continuous Florencia Reasons, MD      . acetaminophen (TYLENOL) tablet 650 mg  650 mg Oral Q6H PRN Meredeth Ide, MD       Or  . acetaminophen (TYLENOL) suppository 650 mg  650 mg Rectal Q6H PRN Meredeth Ide, MD      . ALPRAZolam Prudy Feeler) tablet 1 mg  1 mg Oral TID PRN Meredeth Ide, MD      . amiodarone (PACERONE) tablet 200 mg  200 mg Oral Daily Meredeth Ide, MD      . Melene Muller ON 04/15/2013] atorvastatin (LIPITOR) tablet 20 mg  20 mg Oral q1800 Meredeth Ide, MD      . Melene Muller ON 04/15/2013] carvedilol (COREG) tablet 25 mg  25 mg Oral BID WC Meredeth Ide, MD      . Melene Muller ON 04/15/2013] levothyroxine (SYNTHROID, LEVOTHROID) tablet 100 mcg  100 mcg Oral QAC breakfast Meredeth Ide, MD      . pantoprazole (PROTONIX) 80 mg in sodium chloride 0.9 % 250 mL infusion  8 mg/hr Intravenous Continuous Joya Gaskins, MD 25 mL/hr at 04/14/13 1523 8 mg/hr at 04/14/13 1523  . sodium chloride 0.9 % injection 3 mL  3 mL Intravenous Q12H Meredeth Ide, MD      . sodium chloride 0.9 %  injection 3 mL  3 mL Intravenous PRN Meredeth Ide, MD      . zolpidem (AMBIEN) tablet 5 mg  5 mg Oral QHS PRN Meredeth Ide, MD        Allergies as of 04/14/2013 - Review Complete 04/14/2013  Allergen Reaction Noted  . Mold extract [trichophyton]  07/14/2000    Family History  Problem Relation Age of Onset  . Cancer Mother   .  Heart disease Father   . COPD Brother   . Heart disease Son     History   Social History  . Marital Status: Married    Spouse Name: N/A    Number of Children: N/A  . Years of Education: N/A   Occupational History  . Not on file.   Social History Main Topics  . Smoking status: Never Smoker   . Smokeless tobacco: Never Used  . Alcohol Use: No  . Drug Use: No  . Sexual Activity: No   Other Topics Concern  . Not on file   Social History Narrative  . No narrative on file    Review of Systems: Positive for:. Significant weakness and moderate exertional dyspnea (not dyspnea at rest) in recent weeks. Please see history of present illness for GI review of systems  Physical Exam: Vital signs in last 24 hours: Temp:  [98.1 F (36.7 C)-98.7 F (37.1 C)] 98.6 F (37 C) (10/02 1932) Pulse Rate:  [55-66] 61 (10/02 1932) Resp:  [16-23] 20 (10/02 1932) BP: (111-156)/(31-60) 146/60 mmHg (10/02 1932) SpO2:  [100 %] 100 % (10/02 1932) Last BM Date: 04/13/13 General:   Alert,  Well-developed, well-nourished, pleasant and cooperative in NAD Head:  Normocephalic and atraumatic. Eyes:  Sclera clear, no icterus.   Conjunctiva pale. Mouth:   No ulcerations or lesions.  Oropharynx pink & moist. Lungs:  Clear throughout to auscultation.   No wheezes, crackles, or rhonchi. No evident respiratory distress. Heart:   Regular slow rate; 3/6 systolic murmur; no clicks, rubs,  or gallops. Abdomen:  Soft, nontender, nontympanitic, and nondistended. No masses, hepatosplenomegaly or ventral hernias noted. Normal bowel sounds, without bruits, guarding, or rebound.    Rectal:  Melanotic, per ER physician Pulses:  Normal radial pulse noted. Neurologic:  Alert and coherent;  grossly normal neurologically. Psych:   Alert and cooperative. Normal mood and affect.  Intake/Output from previous day:   Intake/Output this shift:    Lab Results:  Recent Labs  04/13/13 1328 04/14/13 1410  WBC 6.8 5.9  HGB 6.8* 6.1*  HCT 21.5* 19.5*  PLT  --  223   BMET  Recent Labs  04/14/13 1410  NA 136  K 4.4  CL 99  CO2 26  GLUCOSE 123*  BUN 51*  CREATININE 2.07*  CALCIUM 9.0   LFT No results found for this basename: PROT, ALBUMIN, AST, ALT, ALKPHOS, BILITOT, BILIDIR, IBILI,  in the last 72 hours PT/INR  Recent Labs  04/14/13 1410  LABPROT 23.4*  INR 2.16*    Studies/Results: No results found.  Impression: 1. Subacute GI bleed, presumably related to aspirin induced gastropathy, and augmented by anticoagulated status with Xarelto. 2. Severe posthemorrhagic anemia, with mild to moderate anemic symptoms  Plan: Endoscopic evaluation tomorrow, after he has had a chance to have his condition optimized by transfusion. Empiric PPI therapy in the meantime. The nature, purpose, and risks of endoscopic evaluation were reviewed with the patient and his son, and he is agreeable to proceed.   LOS: 0 days   Liviana Mills V  04/14/2013, 7:49 PM

## 2013-04-14 NOTE — H&P (Addendum)
PCP:   Kimber Relic, MD   Chief Complaint:  Weakness  HPI: 79 year old male with a history of A. fib on anticoagulation with Xarelto, aortic stenosis status post aortic valve replacement, which was bioprosthetic valve, hypertension, see daily, status post pacemaker placement who came to the hospital from the primary care clinic after patient was found to be anemic with hemoglobin of 6.1. Patient lives with his wife at home, a month ago patient's wife suffered a stroke and is currently nursing home. Since the patient has not been able to eat and drink well, as he is living by himself. He also noticed that he has lost 13 pound weight, he was getting more week and he was having shortness of breath on exertion. Patient did not have blurred vision. He denies fever. No nausea vomiting or diarrhea. But she admits to having melena. No frank blood noted in the vomitus or stool. Patient is on anticoagulation for A. fib.   Allergies:   Allergies  Allergen Reactions  . Mold Extract [Trichophyton]       Past Medical History  Diagnosis Date  . Hypertension   . Hyperlipidemia   . Aortic stenosis     Moderately Severe by echo 07/2011  . 1St degree AV block   . Shortness of breath   . Angina   . Chronic kidney disease     renal insufficiency  . Pleural effusion     07/2011 - Left > Right - tapped (L) - Transudative  . Atrial fibrillation     07/2011 - amio and Rivaroxaban initiated - tee/dccv  . Diastolic CHF, chronic     07/2011 NL EF by echo  . Orthostatic hypotension   . Coronary artery disease 01/19/2012  . Cardiac pacemaker in situ 06/01/2012  . Unspecified hypothyroidism 09/24/2011  . Anxiety state, unspecified 09/24/2011  . Unspecified cataract 02/04/2011  . Disturbance of salivary secretion 07/25/2009  . Edema 09/05/2008  . Shortness of breath 09/05/2008  . 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 Insomnia, unspecified 05/09/2005  . Urinary frequency 05/09/2005  . Malignant  neoplasm of prostate 03/04/2005  . Hypertrophy of prostate without urinary obstruction and other lower urinary tract symptoms (LUTS) 02/12/2005  . Other malaise and fatigue 02/12/2005  . Nocturia 02/22/2003  . Disturbances of sensation of smell and taste 03/16/2001  . Other specified cardiac dysrhythmias(427.89) 11/19/1999    Past Surgical History  Procedure Laterality Date  . Tee without cardioversion  07/17/2011    Procedure: TRANSESOPHAGEAL ECHOCARDIOGRAM (TEE);  Surgeon: Lewayne Bunting, MD;  Location: [facility] ENDOSCOPY;  Service: Cardiovascular;  Laterality: N/A;  With Cardioversion  . Hemorroidectomy  1968  . Spine surgery  1993    HNP(RUPTURED DISC)John Crecencio Mc  . Prostate surgery  05/26/2005    Dr. Vonita Moss  . Eye surgery  2008    left eye ( Dr. Emmit Pomfret)    Prior to Admission medications   Medication Sig Start Date End Date Taking? Authorizing Provider  ALPRAZolam (XANAX) 0.25 MG tablet TAKE 1 TABLET BY MOUTH UP TO 3 TIMES DAILY AS NEEDED FOR NERVES 12/13/12   Tiffany L Reed, DO  amiodarone (PACERONE) 200 MG tablet TAKE 1 TABLET (200 MG TOTAL) BY MOUTH DAILY to help control heart rhythm. 04/13/13   Kimber Relic, MD  atorvastatin (LIPITOR) 20 MG tablet TAKE 1 TABLET BY MOUTH EVERY DAY 02/28/13   Tiffany L Reed, DO  Bisacodyl (LAXATIVE PO) Take by mouth. OTC Laxative once daily    Historical  Provider, MD  carvedilol (COREG) 25 MG tablet Take 2 tablets every 12 hours for blood pressure 11/30/12   Claudie Revering, NP  [facility] ASPIRIN LOW DOSE 81 MG EC tablet TAKE 1 TABLET (81 MG TOTAL) BY MOUTH DAILY. 04/09/13   Tiffany L Reed, DO  cyanocobalamin 100 MCG tablet Take 100 mcg by mouth daily.    Historical Provider, MD  levothyroxine (SYNTHROID, LEVOTHROID) 100 MCG tablet TAKE 1 TABLET EVERY DAY FOR THYROID SUPPLEMENT 01/10/13   Tiffany L Reed, DO  Multiple Vitamin (MULTIVITAMIN) capsule Take 1 capsule by mouth daily. Take 1 tablet for vitamin supplement    Historical Provider, MD  Omega-3 Fatty  Acids (FISH OIL) 1000 MG CAPS Take by mouth 2 (two) times daily.    Historical Provider, MD  XARELTO 15 MG TABS tablet TAKE 1 TABLET EVERY DAY 03/10/13   Claudie Revering, NP  zolpidem (AMBIEN) 10 MG tablet TAKE 1 TABLET BY MOUTH AT BEDTIME AS NEEDED FOR SLEEP 02/17/13   Claudie Revering, NP    Social History:  reports that he has never smoked. He has never used smokeless tobacco. He reports that he does not drink alcohol or use illicit drugs.  Family History  Problem Relation Age of Onset  . Cancer Mother   . Heart disease Father   . COPD Brother   . Heart disease Son      All the positives are listed in BOLD  Review of Systems:  HEENT: Headache, blurred vision, runny nose, sore throat Neck: Hypothyroidism, hyperthyroidism,,lymphadenopathy Chest : Shortness of breath, history of COPD, Asthma Heart : Chest pain, history of coronary arterey disease GI:  Nausea, vomiting, diarrhea, constipation, GERD GU: Dysuria, urgency, frequency of urination, hematuria Neuro: Stroke, seizures, syncope Psych: Depression, anxiety, hallucinations   Physical Exam: Blood pressure 124/44, pulse 55, resp. rate 20, SpO2 100.00%. Constitutional:   Patient is a well-developed and well-nourished male in no acute distress and cooperative with exam. Head: Normocephalic and atraumatic Mouth: Mucus membranes moist Eyes: PERRL, EOMI, conjunctivae normal Neck: Supple, No Thyromegaly Cardiovascular: RRR, S1 normal, S2 normal Pulmonary/Chest: CTAB, no wheezes, rales, or rhonchi Abdominal: Soft. Non-tender, non-distended, bowel sounds are normal, no masses, organomegaly, or guarding present.  Neurological: A&O x3, Strenght is normal and symmetric bilaterally, cranial nerve II-XII are grossly intact, no focal motor deficit, sensory intact to light touch bilaterally.  Extremities : No Cyanosis, Clubbing or Edema   Labs on Admission:  Results for orders placed during the hospital encounter of 04/14/13 (from the  past 48 hour(s))  BASIC METABOLIC PANEL     Status: Abnormal   Collection Time    04/14/13  2:10 PM      Result Value Range   Sodium 136  135 - 145 mEq/L   Potassium 4.4  3.5 - 5.1 mEq/L   Chloride 99  96 - 112 mEq/L   CO2 26  19 - 32 mEq/L   Glucose, Bld 123 (*) 70 - 99 mg/dL   BUN 51 (*) 6 - 23 mg/dL   Creatinine, Ser 1.61 (*) 0.50 - 1.35 mg/dL   Calcium 9.0  8.4 - 09.6 mg/dL   GFR calc non Af Amer 27 (*) >90 mL/min   GFR calc Af Amer 32 (*) >90 mL/min   Comment: (NOTE)     The eGFR has been calculated using the CKD EPI equation.     This calculation has not been validated in all clinical situations.     eGFR's persistently <90 mL/min  signify possible Chronic Kidney     Disease.  CBC WITH DIFFERENTIAL     Status: Abnormal   Collection Time    04/14/13  2:10 PM      Result Value Range   WBC 5.9  4.0 - 10.5 K/uL   RBC 2.27 (*) 4.22 - 5.81 MIL/uL   Hemoglobin 6.1 (*) 13.0 - 17.0 g/dL   Comment: REPEATED TO VERIFY     CRITICAL RESULT CALLED TO, READ BACK BY AND VERIFIED WITH:     J GAGE RN [id number] 04/14/13 A BROWNING   HCT 19.5 (*) 39.0 - 52.0 %   MCV 85.9  78.0 - 100.0 fL   MCH 26.9  26.0 - 34.0 pg   MCHC 31.3  30.0 - 36.0 g/dL   RDW 16.1  09.6 - 04.5 %   Platelets 223  150 - 400 K/uL   Neutrophils Relative % 66  43 - 77 %   Neutro Abs 3.9  1.7 - 7.7 K/uL   Lymphocytes Relative 19  12 - 46 %   Lymphs Abs 1.1  0.7 - 4.0 K/uL   Monocytes Relative 14 (*) 3 - 12 %   Monocytes Absolute 0.8  0.1 - 1.0 K/uL   Eosinophils Relative 1  0 - 5 %   Eosinophils Absolute 0.1  0.0 - 0.7 K/uL   Basophils Relative 0  0 - 1 %   Basophils Absolute 0.0  0.0 - 0.1 K/uL  PROTIME-INR     Status: Abnormal   Collection Time    04/14/13  2:10 PM      Result Value Range   Prothrombin Time 23.4 (*) 11.6 - 15.2 seconds   INR 2.16 (*) 0.00 - 1.49  OCCULT BLOOD, POC DEVICE     Status: Abnormal   Collection Time    04/14/13  2:28 PM      Result Value Range   Fecal Occult Bld POSITIVE (*) NEGATIVE   TYPE AND SCREEN     Status: None   Collection Time    04/14/13  2:40 PM      Result Value Range   ABO/RH(D) O POS     Antibody Screen NEG     Sample Expiration 04/17/2013     Unit Number [id number]     Blood Component Type RED CELLS,LR     Unit division 00     Status of Unit ALLOCATED     Transfusion Status OK TO TRANSFUSE     Crossmatch Result Compatible     Unit Number [id number]     Blood Component Type RED CELLS,LR     Unit division 00     Status of Unit ALLOCATED     Transfusion Status OK TO TRANSFUSE     Crossmatch Result Compatible    PREPARE RBC (CROSSMATCH)     Status: None   Collection Time    04/14/13  2:40 PM      Result Value Range   Order Confirmation ORDER PROCESSED BY BLOOD BANK    ABO/RH     Status: None   Collection Time    04/14/13  2:40 PM      Result Value Range   ABO/RH(D) O POS      Radiological Exams on Admission: No results found.  Assessment/Plan Active Problems:   HTN (hypertension)   Atrioventricular block, complete  assoc w TAVR   Atrial fibrillation     S/P aortic valve replacement   Anemia   Melena  Melena  Patient's fecal occult blood is positive. He has been started on Protonix drip in the ED. GI Dr. Matthias Hughs has been consulted. We'll keep the patient n.p.o., continue Protonix drip. EGD in a.m. Patient will be admitted to step down unit Will hold the Xarelto. Patient's last dose was this morning  Anemia Patient's hemoglobin is 6.1 Secondary to above Will transfuse 2 units PRBC Check H&H every 8 hours  Status post aortic valve replacement Patient has bioprosthetic aortic valve, and is followed by [facility]. Discussed with cardiology, Dr. Excell Seltzer, will hold the Xarelto.  Atrial fibrillation Patient will be continued on the amiodarone Heart rate is controlled, we'll continue with Coreg at a reduced dose of 25 twice a day, patient was taking 2 tablets of Coreg twice a day at home.  Acute on chronic kidney disease Patient's  baseline creatinine stays around 1.6, today his creatinine is 2.07. Likely due to poor by mouth intake. Will give blood transfusion and repeat BMP in the morning  DVT  prophylaxis SCDs  Code status: Full code  Family discussion: Discussed with patient's son at bedside   Time Spent on Admission: 75 min  [facility] Pager: [phone] 04/14/2013, 4:19 PM  If 7PM-7AM, please contact night-coverage  [email or web address]  Password [email or web address]

## 2013-04-14 NOTE — ED Notes (Signed)
GI dr at bedside to eval pt

## 2013-04-14 NOTE — ED Provider Notes (Signed)
CSN: [id number]     Arrival date & time 04/14/13  1352 History   First MD Initiated Contact with Patient 04/14/13 1359     Chief Complaint  Patient presents with  . Fatigue   Patient is a 79 y.o. male presenting with weakness. The history is provided by the patient, a relative and a caregiver.  Weakness This is a new problem. The current episode started more than 2 days ago. The problem occurs constantly. The problem has been gradually worsening. Associated symptoms include shortness of breath. Pertinent negatives include no chest pain and no abdominal pain. Exacerbated by: activity. The symptoms are relieved by rest. He has tried rest for the symptoms. The treatment provided no relief.  pt reports he has had generalized weakness for several weeks He has noticed dark stools as well He reports that he went to see PCP yesterday and was found to be anemic    Past Medical History  Diagnosis Date  . Hypertension   . Hyperlipidemia   . Aortic stenosis     Moderately Severe by echo 07/2011  . 1St degree AV block   . Shortness of breath   . Angina   . Chronic kidney disease     renal insufficiency  . Pleural effusion     07/2011 - Left > Right - tapped (L) - Transudative  . Atrial fibrillation     07/2011 - amio and Rivaroxaban initiated - tee/dccv  . Diastolic CHF, chronic     07/2011 NL EF by echo  . Orthostatic hypotension   . Coronary artery disease 01/19/2012  . Cardiac pacemaker in situ 06/01/2012  . Unspecified hypothyroidism 09/24/2011  . Anxiety state, unspecified 09/24/2011  . Unspecified cataract 02/04/2011  . Disturbance of salivary secretion 07/25/2009  . Edema 09/05/2008  . Shortness of breath 09/05/2008  . 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 Insomnia, unspecified 05/09/2005  . Urinary frequency 05/09/2005  . Malignant neoplasm of prostate 03/04/2005  . Hypertrophy of prostate without urinary obstruction and other lower urinary tract symptoms (LUTS) 02/12/2005  .  Other malaise and fatigue 02/12/2005  . Nocturia 02/22/2003  . Disturbances of sensation of smell and taste 03/16/2001  . Other specified cardiac dysrhythmias(427.89) 11/19/1999   Past Surgical History  Procedure Laterality Date  . Tee without cardioversion  07/17/2011    Procedure: TRANSESOPHAGEAL ECHOCARDIOGRAM (TEE);  Surgeon: Lewayne Bunting, MD;  Location: [facility] ENDOSCOPY;  Service: Cardiovascular;  Laterality: N/A;  With Cardioversion  . Hemorroidectomy  1968  . Spine surgery  1993    HNP(RUPTURED DISC)John Crecencio Mc  . Prostate surgery  05/26/2005    Dr. Vonita Moss  . Eye surgery  2008    left eye ( Dr. Emmit Pomfret)   Family History  Problem Relation Age of Onset  . Cancer Mother   . Heart disease Father   . COPD Brother   . Heart disease Son    History  Substance Use Topics  . Smoking status: Never Smoker   . Smokeless tobacco: Never Used  . Alcohol Use: No    Review of Systems  Constitutional: Positive for fatigue.  Respiratory: Positive for shortness of breath.   Cardiovascular: Negative for chest pain.  Gastrointestinal: Positive for blood in stool. Negative for abdominal pain.  Neurological: Positive for weakness.  All other systems reviewed and are negative.    Allergies  Mold extract  Home Medications   Current Outpatient Rx  Name  Route  Sig  Dispense  Refill  .  ALPRAZolam (XANAX) 0.25 MG tablet      TAKE 1 TABLET BY MOUTH UP TO 3 TIMES DAILY AS NEEDED FOR NERVES   100 tablet   1     PT IS OUTOF REFILLS   . amiodarone (PACERONE) 200 MG tablet      TAKE 1 TABLET (200 MG TOTAL) BY MOUTH DAILY to help control heart rhythm.   90 tablet   1   . atorvastatin (LIPITOR) 20 MG tablet      TAKE 1 TABLET BY MOUTH EVERY DAY   30 tablet   5   . Bisacodyl (LAXATIVE PO)   Oral   Take by mouth. OTC Laxative once daily         . carvedilol (COREG) 25 MG tablet      Take 2 tablets every 12 hours for blood pressure   60 tablet   5   . CVS ASPIRIN LOW  DOSE 81 MG EC tablet      TAKE 1 TABLET (81 MG TOTAL) BY MOUTH DAILY.   30 tablet   1   . cyanocobalamin 100 MCG tablet   Oral   Take 100 mcg by mouth daily.         Marland Kitchen levothyroxine (SYNTHROID, LEVOTHROID) 100 MCG tablet      TAKE 1 TABLET EVERY DAY FOR THYROID SUPPLEMENT   30 tablet   5   . Multiple Vitamin (MULTIVITAMIN) capsule   Oral   Take 1 capsule by mouth daily. Take 1 tablet for vitamin supplement         . Omega-3 Fatty Acids (FISH OIL) 1000 MG CAPS   Oral   Take by mouth 2 (two) times daily.         Carlena Hurl 15 MG TABS tablet      TAKE 1 TABLET EVERY DAY   30 tablet   2   . zolpidem (AMBIEN) 10 MG tablet      TAKE 1 TABLET BY MOUTH AT BEDTIME AS NEEDED FOR SLEEP   30 tablet   5    BP 156/53  Pulse 66  Resp 23  SpO2 100% BP 130/44  Pulse 55  Resp 16  SpO2 100%  Physical Exam CONSTITUTIONAL: Well developed/well nourished HEAD: Normocephalic/atraumatic EYES: EOMI/PERRL, conjunctiva pale ENMT: Mucous membranes moist NECK: supple no meningeal signs SPINE:entire spine nontender CV: S1/S2 noted, murmur noted Pt has pacemaker in place on left upper chest LUNGS: Lungs are clear to auscultation bilaterally, no apparent distress ABDOMEN: soft, nontender, no rebound or guarding Rectal - stool is black.  No masses or abscess noted.  Chaperone present GU:no cva tenderness NEURO: Pt is awake/alert, moves all extremitiesx4 EXTREMITIES: pulses normal, full ROM SKIN: warm PSYCH: no abnormalities of mood noted  ED Course  Procedures CRITICAL CARE Performed by: Joya Gaskins Total critical care time: 40 Critical care time was exclusive of separately billable procedures and treating other patients. Critical care was necessary to treat or prevent imminent or life-threatening deterioration. Critical care was time spent personally by me on the following activities: development of treatment plan with patient and/or surrogate as well as nursing,  discussions with consultants, evaluation of patient's response to treatment, examination of patient, obtaining history from patient or surrogate, ordering and performing treatments and interventions, ordering and review of laboratory studies,, pulse oximetry and re-evaluation of patient's condition.  Labs Review Labs Reviewed  BASIC METABOLIC PANEL  CBC WITH DIFFERENTIAL  PROTIME-INR  TYPE AND SCREEN   2:54 PM Pt found  to be anemic yesterday, and he is on xarelto (last dose this morning) Blood products will be ordered I spoke to dr buccini with GI, he requests protonix IV/drip Pt give verbal agreement to receive blood products 3:37 PM Will admit to medicine stepdown  D/w dr Sharl Ma He will be transfused blood in the ED Due to short half life of xarelto, will not actively reverse at this time (will need extended transfusion time due to CHF).  D/w dr Sharl Ma and he agrees with this plan  Pt stabilized in the ED.  He was ordered blood for his severe anemia and will need admission to stepdown for monitoring.  GI will likely perform EGD tomorrow after he is given blood and his xarelto has metabolized  MDM  No diagnosis found. Nursing notes including past medical history and social history reviewed and considered in documentation Labs/vital reviewed and considered Previous records reviewed and considered    Date: 04/14/2013  Rate: 67  Rhythm: sinus  QRS Axis: normal  Intervals: normal  ST/T Wave abnormalities: nonspecific ST changes  Conduction Disutrbances:paced rhythm  Narrative Interpretation: artifact noted  Old EKG Reviewed: unchanged    Joya Gaskins, MD 04/14/13 1541

## 2013-04-15 ENCOUNTER — Encounter (HOSPITAL_COMMUNITY): Payer: Self-pay | Admitting: *Deleted

## 2013-04-15 ENCOUNTER — Encounter (HOSPITAL_COMMUNITY)
Admission: EM | Disposition: A | Discharge status: Home / Self Care | Payer: Self-pay | Source: Home / Self Care | Attending: Internal Medicine

## 2013-04-15 HISTORY — PX: ESOPHAGOGASTRODUODENOSCOPY: SHX5428

## 2013-04-15 LAB — CBC
HCT: 24.1 % — ABNORMAL LOW (ref 39.0–52.0)
HCT: 24.5 % — ABNORMAL LOW (ref 39.0–52.0)
Hemoglobin: 7.7 g/dL — ABNORMAL LOW (ref 13.0–17.0)
Hemoglobin: 8 g/dL — ABNORMAL LOW (ref 13.0–17.0)
MCH: 27.8 pg (ref 26.0–34.0)
MCHC: 32.7 g/dL (ref 30.0–36.0)
MCV: 85.8 fL (ref 78.0–100.0)
MCV: 85.1 fL (ref 78.0–100.0)
RBC: 2.81 MIL/uL — ABNORMAL LOW (ref 4.22–5.81)
RDW: 15.3 % (ref 11.5–15.5)
WBC: 7.9 10*3/uL (ref 4.0–10.5)
WBC: 8.9 10*3/uL (ref 4.0–10.5)

## 2013-04-15 LAB — COMPREHENSIVE METABOLIC PANEL
ALT: 23 U/L (ref 0–53)
Alkaline Phosphatase: 38 U/L — ABNORMAL LOW (ref 39–117)
CO2: 26 mEq/L (ref 19–32)
Chloride: 104 mEq/L (ref 96–112)
GFR calc Af Amer: 39 mL/min — ABNORMAL LOW (ref >90)
GFR calc non Af Amer: 34 mL/min — ABNORMAL LOW (ref >90)
Glucose, Bld: 90 mg/dL (ref 70–99)
Potassium: 4.4 mEq/L (ref 3.5–5.1)
Sodium: 139 mEq/L (ref 135–145)
Total Bilirubin: 1.2 mg/dL (ref 0.3–1.2)
Total Protein: 5.6 g/dL — ABNORMAL LOW (ref 6.0–8.3)

## 2013-04-15 LAB — TYPE AND SCREEN
ABO/RH(D): O POS
Unit division: 0
Unit division: 0

## 2013-04-15 SURGERY — EGD (ESOPHAGOGASTRODUODENOSCOPY)
Anesthesia: Moderate Sedation

## 2013-04-15 MED ORDER — MIDAZOLAM HCL 5 MG/ML IJ SOLN
INTRAMUSCULAR | Status: AC
  Filled 2013-04-15: qty 2

## 2013-04-15 MED ORDER — BUTAMBEN-TETRACAINE-BENZOCAINE 2-2-14 % EX AERO
INHALATION_SPRAY | CUTANEOUS | Status: DC | PRN
  Administered 2013-04-15: 2 via TOPICAL

## 2013-04-15 MED ORDER — SODIUM CHLORIDE 0.9 % IV SOLN: 250.0000 mL | INTRAVENOUS | Status: DC | PRN

## 2013-04-15 MED ORDER — FENTANYL CITRATE 0.05 MG/ML IJ SOLN
INTRAMUSCULAR | Status: DC | PRN
  Administered 2013-04-15: 25 ug via INTRAVENOUS

## 2013-04-15 MED ORDER — MIDAZOLAM HCL 10 MG/2ML IJ SOLN
INTRAMUSCULAR | Status: DC | PRN
  Administered 2013-04-15: 2 mg via INTRAVENOUS
  Administered 2013-04-15 (×3): 1 mg via INTRAVENOUS

## 2013-04-15 MED ORDER — FENTANYL CITRATE 0.05 MG/ML IJ SOLN
INTRAMUSCULAR | Status: AC
  Filled 2013-04-15: qty 2

## 2013-04-15 MED ORDER — ENSURE COMPLETE PO LIQD
237.0000 mL | Freq: Every day | ORAL | Status: DC
  Administered 2013-04-15 – 2013-04-18 (×4): 237 mL via ORAL

## 2013-04-15 MED ORDER — PANTOPRAZOLE SODIUM 40 MG IV SOLR
40.0000 mg | Freq: Two times a day (BID) | INTRAVENOUS | Status: DC
  Administered 2013-04-16 – 2013-04-17 (×4): 40 mg via INTRAVENOUS
  Filled 2013-04-15 (×7): qty 40

## 2013-04-15 NOTE — Progress Notes (Signed)
Utilization review completed. Lonie Rummell, RN, BSN. 

## 2013-04-15 NOTE — Op Note (Signed)
[facility] [address] [place], [address]   ENDOSCOPY PROCEDURE REPORT  PATIENT: Barry [facility], Barry Thompson  MR#: [id number] BIRTHDATE: Aug 24, 1923 , 88  yrs. old GENDER: Male ENDOSCOPIST:Mylo Driskill, MD REFERRED BY:  Dr. Frederik Pear PROCEDURE DATE:  04/15/2013 PROCEDURE:      upper endoscopy with control of hemorrhage ASA CLASS: INDICATIONS:   subacute GI bleeding (dark stools) with hemoglobin of 6.1 in a patient on Xarelto and aspirin MEDICATION:    fentanyl 25 mcg, Versed 5 mg IV TOPICAL ANESTHETIC:    Cetacaine spray  DESCRIPTION OF PROCEDURE:   the patient was brought from his [facility] room to the [facility] cone endoscopy unit, provided written consent, and after time out, he received the above sedation. He remained clinically stable throughout the procedure.  The Pentax adult video endoscope was passed under direct vision. The larynx was grossly normal but was not optimally seen.  The esophagus was entered without undue difficulty under direct vision.  The esophagus itself was normal, without evidence of any reflux esophagitis, Mallory-Weiss tear, Barrett's esophagus, varices, infection, or neoplasia.  There was a 2 cm hiatal hernia present, and on the left side of the hiatal hernia, there was a roughly 1 cm adherent clot of maroon and dark blood. This was irrigated off, disclosing an underlying nodular lesion which had slightly hemorrhagic, friable mucosa, almost like regulation tissue. It was soft. It did not look like a neoplasm. Because of its hemorrhagic character, I elected not to biopsy it. 2 clips were applied, which appeared to move freely up and down in the esophagus without impacting the contralateral wall.  The stomach had some slight bilious residual but no blood or coffee-ground material. The gastric mucosa was normal in all areas, specifically without evidence of aspirin-related gastropathy. No erosions, ulcers, polyps, masses,  gastritis, or vascular malformations were noted, and a retroflexed view of the cardia was normal.  The pylorus, duodenal bulb, and second duodenum were normal, again, without evidence of any peptic process.  After applying the above-mentioned clips to the hemorrhagic lesion in the hiatal hernia, the scope was removed from the patient who tolerated the procedure well. No biopsies were obtained.     COMPLICATIONS: None  ENDOSCOPIC IMPRESSION:  1. No active bleeding or blood in the stomach at the time this exam. 2. Hemorrhagic, friable benign-appearing nodular lesion with adherent clot in the patient's small hiatal hernia. Clips were applied to help prevent further bleeding.  RECOMMENDATIONS:  1. Continue antipeptic therapy, although I doubt that this lesion reflects a peptic process. Nonetheless, acid reduction may help reduce tendency for inflammation and bleeding from this site.  2. Consider repeat endoscopy in a few days to check on status of this lesion and thereby guide therapy concerning anticoagulation, and possibly to obtain biopsies of it.  3. I think that this lesion may potentially be slow to heal, so the patient may be at risk for rebleeding for quite some time. A difficult decision will have to be made concerning whether, or when, to resume the patient's Xarelto. to help in at this I would confer with the patient's primary physician. I do think he could be restarted on his aspirin therapy with reasonable safety at any time.    _______________________________ Rosalie DoctorBernette Redbird, MD 04/15/2013 11:19 AM    PATIENT NAME:  Bar[facility], Barry Thompson MR#: [id number]

## 2013-04-15 NOTE — Progress Notes (Addendum)
INITIAL NUTRITION ASSESSMENT  DOCUMENTATION CODES Per approved criteria  -Not Applicable   INTERVENTION:  Ensure Complete PO once daily, each supplement provides 350 kcal and 13 grams of protein.  NUTRITION DIAGNOSIS: Inadequate oral intake related to poor appetite as evidenced by 14 lb weight loss in 3.5 months PTA.   Goal: Intake to meet >90% of estimated nutrition needs.  Monitor:  PO intake, labs, weight trend.  Reason for Assessment: MST=2  79 y.o. male  Admitting Dx: Weakness, Melena  ASSESSMENT: S/P EGD this morning; found to have a friable, nodular lesion w/ adherent clot within the hiatal hernia. Diet advanced to dysphagia 3 with thin liquids this morning. Patient reports that he "pigged out" at lunch today and ate everything on his tray.  Patient with 14 lb weight loss over the past 3-4 months. Patient's wife recently suffered a stroke and she is now in a nursing home leaving the patient to live at home alone. He has not been eating or drinking well since that time. He did drink Ensure 1-2 times daily PTA.  Nutrition focused physical exam completed.  No muscle or subcutaneous fat depletion noticed.  Height: Ht Readings from Last 1 Encounters:  04/15/13 6\' 1"  (1.854 m)    Weight: Wt Readings from Last 1 Encounters:  04/15/13 190 lb 4.1 oz (86.3 kg)    Ideal Body Weight: 83.6 kg  % Ideal Body Weight: 103%  Wt Readings from Last 10 Encounters:  04/15/13 190 lb 4.1 oz (86.3 kg)  04/15/13 190 lb 4.1 oz (86.3 kg)  04/13/13 191 lb (86.637 kg)  01/05/13 204 lb 6.4 oz (92.715 kg)  11/02/12 203 lb 6.4 oz (92.262 kg)  10/08/12 205 lb (92.987 kg)  08/16/12 205 lb 9.6 oz (93.26 kg)  03/03/12 208 lb (94.348 kg)  02/29/12 209 lb (94.802 kg)  01/23/12 214 lb (97.07 kg)    Usual Body Weight: 204 lb (3.5 months ago)  % Usual Body Weight: 93%  BMI:  Body mass index is 25.11 kg/(m^2).  Estimated Nutritional Needs: Kcal: 1850-2050 Protein: 80-90 gm Fluid:  1.9-2.1 L  Skin: no wounds  Diet Order: Dysphagia 3 with thin liquids  EDUCATION NEEDS: -Education not appropriate at this time   Intake/Output Summary (Last 24 hours) at 04/15/13 1427 Last data filed at 04/15/13 1300  Gross per 24 hour  Intake   1470 ml  Output   1100 ml  Net    370 ml    Last BM: 10/1   Labs:   Recent Labs Lab 04/11/13 0805 04/14/13 1410 04/15/13 0400  NA 143 136 139  K 4.5 4.4 4.4  CL 102 99 104  CO2 27 26 26   BUN 50* 51* 40*  CREATININE 2.01* 2.07* 1.75*  CALCIUM 9.3 9.0 8.5  GLUCOSE 111* 123* 90    CBG (last 3)  No results found for this basename: GLUCAP,  in the last 72 hours  Scheduled Meds: . amiodarone  200 mg Oral Daily  . atorvastatin  20 mg Oral q1800  . carvedilol  25 mg Oral BID WC  . levothyroxine  100 mcg Oral QAC breakfast  . sodium chloride  3 mL Intravenous Q12H    Continuous Infusions: . pantoprozole (PROTONIX) infusion 8 mg/hr (04/15/13 0127)    Past Medical History  Diagnosis Date  . Hypertension   . Hyperlipidemia   . Aortic stenosis     Moderately Severe by echo 07/2011  . 1St degree AV block   . Shortness of breath   .  Angina   . Chronic kidney disease     renal insufficiency  . Pleural effusion     07/2011 - Left > Right - tapped (L) - Transudative  . Atrial fibrillation     07/2011 - amio and Rivaroxaban initiated - tee/dccv  . Diastolic CHF, chronic     07/2011 NL EF by echo  . Orthostatic hypotension   . Coronary artery disease 01/19/2012  . Cardiac pacemaker in situ 06/01/2012  . Unspecified hypothyroidism 09/24/2011  . Anxiety state, unspecified 09/24/2011  . Unspecified cataract 02/04/2011  . Disturbance of salivary secretion 07/25/2009  . Edema 09/05/2008  . Shortness of breath 09/05/2008  . 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 Insomnia, unspecified 05/09/2005  . Urinary frequency 05/09/2005  . Malignant neoplasm of prostate 03/04/2005  . Hypertrophy of prostate without urinary  obstruction and other lower urinary tract symptoms (LUTS) 02/12/2005  . Other malaise and fatigue 02/12/2005  . Nocturia 02/22/2003  . Disturbances of sensation of smell and taste 03/16/2001  . Other specified cardiac dysrhythmias(427.89) 11/19/1999    Past Surgical History  Procedure Laterality Date  . Tee without cardioversion  07/17/2011    Procedure: TRANSESOPHAGEAL ECHOCARDIOGRAM (TEE);  Surgeon: Lewayne Bunting, MD;  Location: [facility] ENDOSCOPY;  Service: Cardiovascular;  Laterality: N/A;  With Cardioversion  . Hemorroidectomy  1968  . Spine surgery  1993    HNP(RUPTURED DISC)John Crecencio Mc  . Prostate surgery  05/26/2005    Dr. Vonita Moss  . Eye surgery  2008    left eye ( Dr. Emmit Pomfret)    Joaquin Courts, RD, LDN, CNSC Pager [phone] After Hours Pager [phone]

## 2013-04-15 NOTE — Progress Notes (Signed)
[facility] Progress Note [name]   [facility] ZOX:[id number] DOB: 01/08/1924 DOA: 04/14/2013 PCP: Kimber Relic, MD  Brief narrative: 79 year old male patient with known history of chronic atrial fibrillation on Xarelto. Also has bioprosthetic aortic valve, and hypertension. Was sent to the hospital from a primary care clinic after found to be anemic with a hemoglobin of 6.1. Patient's wife recently suffered a stroke one month ago and she is now in a nursing home leaving the patient to live at home alone. Since that time the patient has not been eating or drinking well and he has noticed at least a 13 pound weight loss over that period of time. He also noticed he was getting more weak and having shortness of breath with exertion. Admitted to recent dark stools.   Assessment/Plan:  Anemia due to chronic blood loss -Hgb nadir 6.1- now up to 7.7 after 2 units PRBCs -follow CBC in serial fashion until stable   Melena -now post EGD - found to have a friable nodular lesion w/ adherent clot within a hiatal hernia -GI recommends to cont anti peptic rxn and feels area will be slow to heal (see below)  HTN (hypertension) -controlled  Atrial fibrillation on chronic Xarelto -rate controlled on Coreg and Amiodarone -due to concerns friable GI lesion will be slow to heal therefore high risk for recurrent bleeding GI unsure as to when can resume Xarelto - OK to resume ASA   S/P bioprosthetic aortic valve replacement No clinical evidence of difficulty   Chronic kidney disease, stage IV (severe) -stable  Chronic diastolic congestive heart failure -well compensated at present   Hypothyroidism -cont Synthroid   DVT prophylaxis: SCDs Code Status: Full Family Communication: Patient Disposition Plan/Expected LOS: Transfer to telemetry - monitor Hgb in serial fashion - may require repeat EGD ~Monday    Consultants: Gastroenterology  Procedures: Esophagogastro duodenoscopy No active bleeding or blood in the stomach at the time this exam.  Hemorrhagic, friable benign-appearing nodular lesion with  adherent clot in the patient's small hiatal hernia. Clips were  applied to help prevent further bleeding.  Antibiotics: None  HPI/Subjective: Patient alert and endorses no apparent further melena since admission. No chest pain or shortness of breath. Still feels somewhat weak.  Objective: Blood pressure 121/24, pulse 55, temperature 98.6 F (37 C), temperature source Oral, resp. rate 14, height 6\' 1"  (1.854 m), weight 86.3 kg (190 lb 4.1 oz), SpO2 98.00%.  Intake/Output Summary (Last 24 hours) at 04/15/13 1324 Last data filed at 04/15/13 0800  Gross per 24 hour  Intake    990 ml  Output    900 ml  Net     90 ml   Exam: General: No acute respiratory distress Lungs: Clear to auscultation bilaterally without wheezes or crackles, RA Cardiovascular: Regular rate and rhythm without murmur gallop or rub normal S1 and S2, no peripheral edema or JVD Abdomen: Nontender, nondistended, soft, bowel sounds positive, no rebound, no ascites, no appreciable mass Musculoskeletal: No significant cyanosis, clubbing of bilateral lower extremities Neurological: Alert and oriented x 3, moves all extremities x 4 without focal neurological deficits, CN 2-12 intact  Scheduled Meds:  Scheduled Meds: . amiodarone  200 mg Oral Daily  . atorvastatin  20 mg Oral q1800  . carvedilol  25 mg Oral BID WC  . levothyroxine  100 mcg Oral QAC breakfast  . sodium chloride  3 mL Intravenous Q12H    Data Reviewed: Basic Metabolic Panel:  Recent Labs Lab 04/11/13 0805 04/14/13 1410 04/15/13 0400  NA 143 136 139  K 4.5 4.4 4.4  CL 102 99 104  CO2 27 26 26   GLUCOSE 111* 123* 90  BUN 50* 51* 40*  CREATININE 2.01* 2.07* 1.75*  CALCIUM 9.3 9.0 8.5   Liver Function Tests:  Recent Labs Lab 04/11/13 0805  04/15/13 0400  AST 18 22  ALT 24 23  ALKPHOS 40 38*  BILITOT 0.4 1.2  PROT 6.0 5.6*  ALBUMIN  --  3.1*   CBC:  Recent Labs Lab 04/13/13 1328 04/14/13 1410 04/15/13 0400  WBC 6.8 5.9 7.9  NEUTROABS 4.8 3.9  --   HGB 6.8* 6.1* 7.7*  HCT 21.5* 19.5* 24.1*  MCV 87 85.9 85.8  PLT  --  223 182    Recent Results (from the past 240 hour(s))  MRSA PCR SCREENING     Status: None   Collection Time    04/14/13  6:37 PM      Result Value Range Status   MRSA by PCR NEGATIVE  NEGATIVE Final   Comment:            The GeneXpert MRSA Assay (FDA     approved for NASAL specimens     only), is one component of a     comprehensive MRSA colonization     surveillance program. It is not     intended to diagnose MRSA     infection nor to guide or     monitor treatment for     MRSA infections.     Studies:  Recent x-ray studies have been reviewed in detail by the Attending Physician  Junious Silk, ANP [facility] Office  [phone] Pager [phone]  **If unable to reach the above provider after paging please contact the Flow Manager @ [phone]  On-Call/Text Page:      [name]      password [email or web address]  If 7PM-7AM, please contact night-coverage [email or web address] Password [email or web address] 04/15/2013, 1:24 PM   LOS: 1 day   I have personally examined this patient and reviewed the entire database. I have reviewed the above note, made any necessary editorial changes, and agree with its content.  Lonia Blood, MD [facility]

## 2013-04-15 NOTE — Progress Notes (Signed)
Endoscopy well tolerated.  Findings are unusual and management will be somewhat difficult to decide on. Please see dictated report for findings and recommendations.  Please call me if any questions.  Florencia Reasons, M.D. [phone]

## 2013-04-16 DIAGNOSIS — D500 Iron deficiency anemia secondary to blood loss (chronic): Secondary | ICD-10-CM

## 2013-04-16 LAB — CBC
MCH: 27.4 pg (ref 26.0–34.0)
MCV: 85.1 fL (ref 78.0–100.0)
Platelets: 180 10*3/uL (ref 150–400)
RBC: 2.81 MIL/uL — ABNORMAL LOW (ref 4.22–5.81)
RDW: 15.3 % (ref 11.5–15.5)

## 2013-04-16 NOTE — Progress Notes (Signed)
Patient ID: [facility], male   DOB: 11-20-1923, 79 y.o.   MRN: [id number] [facility] Gastroenterology Progress Note  TIMOTY BOURKE 79 y.o. October 23, 1923   Subjective: Sitting on side of the bed. No complaints. Reports 2 BMs this morning. Denies abdominal pain/N/V.  Objective: Vital signs in last 24 hours: Filed Vitals:   04/16/13 0825  BP: 129/47  Pulse: 63  Temp: 98.3  Resp: 18    Physical Exam: Gen: alert, no acute distress, hard of hearing Abd: soft, nontender, nondistended, +BS  Lab Results:  Recent Labs  04/14/13 1410 04/15/13 0400  NA 136 139  K 4.4 4.4  CL 99 104  CO2 26 26  GLUCOSE 123* 90  BUN 51* 40*  CREATININE 2.07* 1.75*  CALCIUM 9.0 8.5    Recent Labs  04/15/13 0400  AST 22  ALT 23  ALKPHOS 38*  BILITOT 1.2  PROT 5.6*  ALBUMIN 3.1*    Recent Labs  04/13/13 1328 04/14/13 1410  04/15/13 2027 04/16/13 0503  WBC 6.8 5.9  < > 8.9 7.9  NEUTROABS 4.8 3.9  --   --   --   HGB 6.8* 6.1*  < > 8.0* 7.7*  HCT 21.5* 19.5*  < > 24.5* 23.9*  MCV 87 85.9  < > 85.1 85.1  PLT  --  223  < > 174 180  < > = values in this interval not displayed.  Recent Labs  04/14/13 1410 04/15/13 0400  LABPROT 23.4* 19.4*  INR 2.16* 1.69*      Assessment/Plan: 79 yo s/p GI bleed concerning for area in hiatal hernia sac s/p hemoclipping. Hgb low but stable. Continue PPI IV Q 12 hours. Changing to clears and do not advance diet today to reduce chance of hemoclip being dislodged. Repeat EGD Monday to reassess. Will follow.   Remington Skalsky C. 04/16/2013, 11:13 AM

## 2013-04-16 NOTE — Progress Notes (Signed)
Chart reviewed.  [facility] Progress Note  Barry Thompson RUE:[id number] DOB: Dec 08, 1923 DOA: 04/14/2013 PCP: Kimber Relic, MD  Brief narrative: 79 year old male patient with known history of chronic atrial fibrillation on Xarelto. Also has bioprosthetic aortic valve, and hypertension. Was sent to the hospital from a primary care clinic after found to be anemic with a hemoglobin of 6.1. Patient's wife recently suffered a stroke one month ago and she is now in a nursing home leaving the patient to live at home alone. Since that time the patient has not been eating or drinking well and he has noticed at least a 13 pound weight loss over that period of time. He also noticed he was getting more weak and having shortness of breath with exertion. Admitted to recent dark stools.   Assessment/Plan:  Anemia due to chronic blood loss -Hgb nadir 6.1- now up to 7.7 after 2 units PRBCs monitor  UGI Bleed  friable nodular lesion w/ adherent clot within a hiatal hernia Repeat EGD monday  HTN (hypertension) -controlled  Atrial fibrillation on chronic Xarelto -rate controlled on Coreg and Amiodarone Will discuss with cardiology monday   S/P bioprosthetic aortic valve replacement  Chronic kidney disease, stage IV (severe) -stable  Chronic diastolic congestive heart failure Compensated  Hypothyroidism -cont Synthroid   DVT prophylaxis: SCDs Code Status: Full Family Communication: Patient Disposition Plan/Expected LOS: home  Consultants: Gastroenterology  Procedures: Esophagogastro duodenoscopy No active bleeding or blood in the stomach at the time this exam.  Hemorrhagic, friable benign-appearing nodular lesion with  adherent clot in the patient's small hiatal hernia. Clips were  applied to help prevent further bleeding.  Antibiotics: None  HPI/Subjective: 2 brown stools today. No N/V, dyspnea, or CP.  Objective: Blood pressure 163/57, pulse 60, temperature 98.3 F  (36.8 C), temperature source Oral, resp. rate 18, height 6\' 1"  (1.854 m), weight 86.3 kg (190 lb 4.1 oz), SpO2 96.00%.  Intake/Output Summary (Last 24 hours) at 04/16/13 0802 Last data filed at 04/16/13 0300  Gross per 24 hour  Intake   1540 ml  Output    650 ml  Net    890 ml   Exam: General: asleep, arousable.  Comfortable. Oriented and appropriate Lungs: Clear to auscultation bilaterally without WRR Cardiovascular: Regular rate and rhythm without murmur gallop or rub normal S1 and S2,  Abdomen: Nontender, nondistended, soft, bowel sounds positive, no rebound, no ascites, no appreciable mass Musculoskeletal: No significant cyanosis, clubbing of bilateral lower extremities   Scheduled Meds:  Scheduled Meds: . amiodarone  200 mg Oral Daily  . atorvastatin  20 mg Oral q1800  . carvedilol  25 mg Oral BID WC  . feeding supplement  237 mL Oral Q2000  . levothyroxine  100 mcg Oral QAC breakfast  . pantoprazole (PROTONIX) IV  40 mg Intravenous Q12H    Data Reviewed: Basic Metabolic Panel:  Recent Labs Lab 04/11/13 0805 04/14/13 1410 04/15/13 0400  NA 143 136 139  K 4.5 4.4 4.4  CL 102 99 104  CO2 27 26 26   GLUCOSE 111* 123* 90  BUN 50* 51* 40*  CREATININE 2.01* 2.07* 1.75*  CALCIUM 9.3 9.0 8.5   Liver Function Tests:  Recent Labs Lab 04/11/13 0805 04/15/13 0400  AST 18 22  ALT 24 23  ALKPHOS 40 38*  BILITOT 0.4 1.2  PROT 6.0 5.6*  ALBUMIN  --  3.1*   CBC:  Recent Labs Lab 04/13/13 1328 04/14/13 1410 04/15/13 0400 04/15/13 1356 04/15/13 2027 04/16/13 0503  WBC 6.8 5.9 7.9  --  8.9 7.9  NEUTROABS 4.8 3.9  --   --   --   --   HGB 6.8* 6.1* 7.7* 7.9* 8.0* 7.7*  HCT 21.5* 19.5* 24.1* 24.5* 24.5* 23.9*  MCV 87 85.9 85.8  --  85.1 85.1  PLT  --  223 182  --  174 180    Recent Results (from the past 240 hour(s))  MRSA PCR SCREENING     Status: None   Collection Time    04/14/13  6:37 PM      Result Value Range Status   MRSA by PCR NEGATIVE  NEGATIVE  Final   Comment:            The GeneXpert MRSA Assay (FDA     approved for NASAL specimens     only), is one component of a     comprehensive MRSA colonization     surveillance program. It is not     intended to diagnose MRSA     infection nor to guide or     monitor treatment for     MRSA infections.    Crista Curb, M.D. [facility] [phone]

## 2013-04-17 LAB — BASIC METABOLIC PANEL
BUN: 21 mg/dL (ref 6–23)
Calcium: 8.4 mg/dL (ref 8.4–10.5)
Creatinine, Ser: 1.38 mg/dL — ABNORMAL HIGH (ref 0.50–1.35)
GFR calc Af Amer: 51 mL/min — ABNORMAL LOW (ref >90)
GFR calc non Af Amer: 44 mL/min — ABNORMAL LOW (ref >90)
Glucose, Bld: 91 mg/dL (ref 70–99)
Potassium: 4.4 mEq/L (ref 3.5–5.1)

## 2013-04-17 LAB — HEMOGLOBIN AND HEMATOCRIT, BLOOD
HCT: 23.1 % — ABNORMAL LOW (ref 39.0–52.0)
Hemoglobin: 7.4 g/dL — ABNORMAL LOW (ref 13.0–17.0)

## 2013-04-17 MED ORDER — SODIUM CHLORIDE 0.9 % IV SOLN
INTRAVENOUS | Status: DC
  Administered 2013-04-18: 09:00:00 via INTRAVENOUS

## 2013-04-17 NOTE — Progress Notes (Signed)
Patient ID: Barry Thompson, male   DOB: [birth year], 79 y.o.   MRN: [id number]  Sitting in chair. No complaints. Tolerating clears.  Reports normal colored stool today.  Hgb 7.4 (7.7, 8.0)  NPO after midnight. Repeat EGD tomorrow by Dr. Evette Cristal to assess status of area hemoclipped in hiatal hernia. If it looks ok, then he can have his diet advanced following his endoscopy. Further recs from Dr. Evette Cristal following his repeat endoscopy.

## 2013-04-17 NOTE — Progress Notes (Addendum)
[facility] Progress Note  CHIOKE NOXON WUJ:[id number] DOB: May 15, 1924 DOA: 04/14/2013 PCP: Kimber Relic, MD  Brief narrative: 79 year old male patient with known history of chronic atrial fibrillation on Xarelto. Also has bioprosthetic aortic valve, and hypertension. Was sent to the hospital from a primary care clinic after found to be anemic with a hemoglobin of 6.1. Patient's wife recently suffered a stroke one month ago and she is now in a nursing home leaving the patient to live at home alone. Since that time the patient has not been eating or drinking well and he has noticed at least a 13 pound weight loss over that period of time. He also noticed he was getting more weak and having shortness of breath with exertion. Admitted to recent dark stools.   Assessment/Plan:  Anemia due to chronic blood loss Monitor. No overt bleeding. Repeat EGD tomorrow. Diet per gi  UGI Bleed  friable nodular lesion w/ adherent clot within a hiatal hernia Repeat EGD monday  HTN (hypertension) -controlled  Atrial fibrillation on chronic Xarelto -rate controlled on Coreg and Amiodarone Will discuss with cardiology Monday AV paced on tele.  Stable.  Will d/c tele   S/P bioprosthetic aortic valve replacement  Chronic kidney disease, stage IV (severe) -stable  Chronic diastolic congestive heart failure Compensated  Hypothyroidism -cont Synthroid  Per RN, unsteady gait.  Lives alone.  Uses a cane.  Will get PT eval  DVT prophylaxis: SCDs Code Status: Full Family Communication: Patient Disposition Plan/Expected LOS: home  Consultants: Gastroenterology  Procedures: Esophagogastro duodenoscopy No active bleeding or blood in the stomach at the time this exam.  Hemorrhagic, friable benign-appearing nodular lesion with  adherent clot in the patient's small hiatal hernia. Clips were  applied to help prevent further bleeding.  Antibiotics: None  HPI/Subjective: No N/V, dyspnea,  or CP.  Had stool today, but RN did not see color  Objective: Blood pressure 117/47, pulse 75, temperature 97.6 F (36.4 C), temperature source Oral, resp. rate 16, height 6\' 1"  (1.854 m), weight 86.3 kg (190 lb 4.1 oz), SpO2 100.00%.  Intake/Output Summary (Last 24 hours) at 04/17/13 1119 Last data filed at 04/17/13 0900  Gross per 24 hour  Intake    960 ml  Output    550 ml  Net    410 ml   Exam: General: asleep, arousable.  Comfortable. Oriented and appropriate Lungs: Clear to auscultation bilaterally without WRR Cardiovascular: Regular rate and rhythm without murmur gallop or rub normal S1 and S2,  Abdomen: Nontender, nondistended, soft, bowel sounds positive, no rebound, no ascites, no appreciable mass Musculoskeletal: No significant cyanosis, clubbing of bilateral lower extremities   Scheduled Meds:  Scheduled Meds: . amiodarone  200 mg Oral Daily  . atorvastatin  20 mg Oral q1800  . carvedilol  25 mg Oral BID WC  . feeding supplement  237 mL Oral Q2000  . levothyroxine  100 mcg Oral QAC breakfast  . pantoprazole (PROTONIX) IV  40 mg Intravenous Q12H    Data Reviewed: Basic Metabolic Panel:  Recent Labs Lab 04/11/13 0805 04/14/13 1410 04/15/13 0400 04/17/13 0630  NA 143 136 139 138  K 4.5 4.4 4.4 4.4  CL 102 99 104 104  CO2 27 26 26 28   GLUCOSE 111* 123* 90 91  BUN 50* 51* 40* 21  CREATININE 2.01* 2.07* 1.75* 1.38*  CALCIUM 9.3 9.0 8.5 8.4   Liver Function Tests:  Recent Labs Lab 04/11/13 0805 04/15/13 0400  AST 18 22  ALT 24  23  ALKPHOS 40 38*  BILITOT 0.4 1.2  PROT 6.0 5.6*  ALBUMIN  --  3.1*   CBC:  Recent Labs Lab 04/13/13 1328 04/14/13 1410 04/15/13 0400 04/15/13 1356 04/15/13 2027 04/16/13 0503 04/17/13 0630  WBC 6.8 5.9 7.9  --  8.9 7.9  --   NEUTROABS 4.8 3.9  --   --   --   --   --   HGB 6.8* 6.1* 7.7* 7.9* 8.0* 7.7* 7.4*  HCT 21.5* 19.5* 24.1* 24.5* 24.5* 23.9* 23.1*  MCV 87 85.9 85.8  --  85.1 85.1  --   PLT  --  223 182   --  174 180  --     Recent Results (from the past 240 hour(s))  MRSA PCR SCREENING     Status: None   Collection Time    04/14/13  6:37 PM      Result Value Range Status   MRSA by PCR NEGATIVE  NEGATIVE Final   Comment:            The GeneXpert MRSA Assay (FDA     approved for NASAL specimens     only), is one component of a     comprehensive MRSA colonization     surveillance program. It is not     intended to diagnose MRSA     infection nor to guide or     monitor treatment for     MRSA infections.    Crista Curb, M.D. [facility] [phone]

## 2013-04-18 ENCOUNTER — Encounter (HOSPITAL_COMMUNITY)
Admission: EM | Disposition: A | Discharge status: Home / Self Care | Payer: Self-pay | Source: Home / Self Care | Attending: Internal Medicine

## 2013-04-18 ENCOUNTER — Encounter (HOSPITAL_COMMUNITY): Payer: Self-pay | Admitting: Gastroenterology

## 2013-04-18 DIAGNOSIS — Z954 Presence of other heart-valve replacement: Secondary | ICD-10-CM

## 2013-04-18 HISTORY — PX: ESOPHAGOGASTRODUODENOSCOPY: SHX5428

## 2013-04-18 LAB — HEMOGLOBIN AND HEMATOCRIT, BLOOD: HCT: 23.7 % — ABNORMAL LOW (ref 39.0–52.0)

## 2013-04-18 SURGERY — EGD (ESOPHAGOGASTRODUODENOSCOPY)
Anesthesia: Moderate Sedation

## 2013-04-18 MED ORDER — PANTOPRAZOLE SODIUM 40 MG PO TBEC
40.0000 mg | DELAYED_RELEASE_TABLET | Freq: Two times a day (BID) | ORAL | Status: DC
  Administered 2013-04-18 – 2013-04-19 (×2): 40 mg via ORAL
  Filled 2013-04-18 (×2): qty 1

## 2013-04-18 MED ORDER — FENTANYL CITRATE 0.05 MG/ML IJ SOLN
INTRAMUSCULAR | Status: DC | PRN
  Administered 2013-04-18 (×2): 25 ug via INTRAVENOUS

## 2013-04-18 MED ORDER — MIDAZOLAM HCL 10 MG/2ML IJ SOLN
INTRAMUSCULAR | Status: DC | PRN
  Administered 2013-04-18 (×2): 2 mg via INTRAVENOUS
  Administered 2013-04-18: 1 mg via INTRAVENOUS

## 2013-04-18 MED ORDER — BUTAMBEN-TETRACAINE-BENZOCAINE 2-2-14 % EX AERO
INHALATION_SPRAY | CUTANEOUS | Status: DC | PRN
  Administered 2013-04-18: 2 via TOPICAL

## 2013-04-18 MED ORDER — MIDAZOLAM HCL 5 MG/ML IJ SOLN
INTRAMUSCULAR | Status: AC
  Filled 2013-04-18: qty 2

## 2013-04-18 MED ORDER — DIPHENHYDRAMINE HCL 50 MG/ML IJ SOLN
INTRAMUSCULAR | Status: AC
  Filled 2013-04-18: qty 1

## 2013-04-18 MED ORDER — FENTANYL CITRATE 0.05 MG/ML IJ SOLN
INTRAMUSCULAR | Status: AC
  Filled 2013-04-18: qty 4

## 2013-04-18 NOTE — Progress Notes (Signed)
Patient ambulated 250 feet in hallway with cane.  Tolerated ambulation well.  Will continue to monitor. [place], Barry Thompson

## 2013-04-18 NOTE — Progress Notes (Signed)
A follow up appointment has been made with Dr. Eden Emms (cardiology) on 10/22 @ 3 pm.  Barry Thompson

## 2013-04-18 NOTE — Care Management Note (Unsigned)
    Page 1 of 1   04/18/2013     3:40:50 PM   CARE MANAGEMENT NOTE 04/18/2013  Patient:  Barry Thompson, Barry Thompson   Account Number:  [id number]  Date Initiated:  04/18/2013  Documentation initiated by:  GRAVES-BIGELOW,Tunisha Ruland  Subjective/Objective Assessment:   Pt admitted with anemia hgb 6.1 and s/p transfusion 2 units PRBC and hgb up to 7.7. Post endo today. Plan to continue with PPI.     Action/Plan:   CM will continue to monitor for disposition needs.   Anticipated DC Date:  04/20/2013   Anticipated DC Plan:  HOME/SELF CARE      DC Planning Services  CM consult      Choice offered to / List presented to:             Status of service:  In process, will continue to follow Medicare Important Message given?   (If response is "NO", the following Medicare IM given date fields will be blank) Date Medicare IM given:   Date Additional Medicare IM given:    Discharge Disposition:    Per UR Regulation:  Reviewed for med. necessity/level of care/duration of stay  If discussed at Long Length of Stay Meetings, dates discussed:    Comments:

## 2013-04-18 NOTE — Progress Notes (Signed)
PT Cancellation Note  Patient Details Name: Barry Thompson MRN: [id number] DOB: 11/09/1923   Cancelled Treatment:    Reason Eval/Treat Not Completed: Patient at procedure or test/unavailable (endo).  PT to check back later as time allows.     Rollene Rotunda Judia Arnott, PT, DPT [phone]   04/18/2013, 11:09 AM

## 2013-04-18 NOTE — Progress Notes (Signed)
[facility] Progress Note  Barry Thompson ZOX:[id number] DOB: 12/14/1923 DOA: 04/14/2013 PCP: Kimber Relic, MD  Brief narrative: 79 year old male patient with known history of chronic atrial fibrillation on Xarelto. Also has bioprosthetic aortic valve, and hypertension. Was sent to the hospital from a primary care clinic after found to be anemic with a hemoglobin of 6.1. Patient's wife recently suffered a stroke one month ago and she is now in a nursing home leaving the patient to live at home alone. Since that time the patient has not been eating or drinking well and he has noticed at least a 13 pound weight loss over that period of time. He also noticed he was getting more weak and having shortness of breath with exertion. Admitted to recent dark stools.   Assessment/Plan:  Anemia due to chronic blood loss Monitor on solid diet.  Home in am if stable  UGI Bleed  friable nodular lesion w/ adherent clot within a hiatal hernia Repeat EGD shows clip gone and no bleeding.  Discussed with Dr. Excell Seltzer.  Stop xarelto. Discharge on ASA 81 mg and f/u in the office  HTN (hypertension) -controlled  Atrial fibrillation  S/P bioprosthetic aortic valve replacement  Chronic kidney disease, stage IV (severe)  Chronic diastolic congestive heart failure Compensated  Hypothyroidism  Walked around unit with cane.  DVT prophylaxis: SCDs Code Status: Full Family Communication: son Disposition Plan/Expected LOS: home  Consultants: Gastroenterology  Procedures: Esophagogastro duodenoscopy No active bleeding or blood in the stomach at the time this exam.  Hemorrhagic, friable benign-appearing nodular lesion with  adherent clot in the patient's small hiatal hernia. Clips were  applied to help prevent further bleeding.  Antibiotics: None  HPI/Subjective: No bleeding.  Ate well  Objective: Blood pressure 116/50, pulse 61, temperature 97.3 F (36.3 C), temperature source Oral,  resp. rate 20, height 6\' 1"  (1.854 m), weight 86.3 kg (190 lb 4.1 oz), SpO2 97.00%.  Intake/Output Summary (Last 24 hours) at 04/18/13 1452 Last data filed at 04/18/13 1300  Gross per 24 hour  Intake    360 ml  Output   1575 ml  Net  -1215 ml   Exam: General: walking around in room Lungs: Clear to auscultation bilaterally without WRR Cardiovascular: Regular rate and rhythm without murmur gallop or rub normal S1 and S2,  Abdomen: Nontender, nondistended, soft, bowel sounds positive, no rebound, no ascites, no appreciable mass Musculoskeletal: No significant cyanosis, clubbing of bilateral lower extremities  Scheduled Meds:  Scheduled Meds: . amiodarone  200 mg Oral Daily  . atorvastatin  20 mg Oral q1800  . carvedilol  25 mg Oral BID WC  . feeding supplement  237 mL Oral Q2000  . levothyroxine  100 mcg Oral QAC breakfast  . pantoprazole (PROTONIX) IV  40 mg Intravenous Q12H    Data Reviewed: Basic Metabolic Panel:  Recent Labs Lab 04/14/13 1410 04/15/13 0400 04/17/13 0630  NA 136 139 138  K 4.4 4.4 4.4  CL 99 104 104  CO2 26 26 28   GLUCOSE 123* 90 91  BUN 51* 40* 21  CREATININE 2.07* 1.75* 1.38*  CALCIUM 9.0 8.5 8.4   Liver Function Tests:  Recent Labs Lab 04/15/13 0400  AST 22  ALT 23  ALKPHOS 38*  BILITOT 1.2  PROT 5.6*  ALBUMIN 3.1*   CBC:  Recent Labs Lab 04/13/13 1328 04/14/13 1410 04/15/13 0400 04/15/13 1356 04/15/13 2027 04/16/13 0503 04/17/13 0630 04/18/13 0525  WBC 6.8 5.9 7.9  --  8.9 7.9  --   --  NEUTROABS 4.8 3.9  --   --   --   --   --   --   HGB 6.8* 6.1* 7.7* 7.9* 8.0* 7.7* 7.4* 7.6*  HCT 21.5* 19.5* 24.1* 24.5* 24.5* 23.9* 23.1* 23.7*  MCV 87 85.9 85.8  --  85.1 85.1  --   --   PLT  --  223 182  --  174 180  --   --     Recent Results (from the past 240 hour(s))  MRSA PCR SCREENING     Status: None   Collection Time    04/14/13  6:37 PM      Result Value Range Status   MRSA by PCR NEGATIVE  NEGATIVE Final   Comment:             The GeneXpert MRSA Assay (FDA     approved for NASAL specimens     only), is one component of a     comprehensive MRSA colonization     surveillance program. It is not     intended to diagnose MRSA     infection nor to guide or     monitor treatment for     MRSA infections.    Crista Curb, M.D. [facility] [phone]

## 2013-04-18 NOTE — Op Note (Signed)
[facility] [address] [place], [address]   ENDOSCOPY PROCEDURE REPORT  PATIENT: Barry Thompson, Barry Thompson  MR#: [id number] BIRTHDATE: April 06, 1924 , 89  yrs. old GENDER: Male ENDOSCOPIST: Wandalee Ferdinand, MD REFERRED BY: PROCEDURE DATE:  04/18/2013 PROCEDURE:   EGD ASA CLASS: 3 INDICATIONS: anemia, reassess lesion seen on prior endoscopy in the hiatal hernia which had a clot on it and was clipped MEDICATIONS: fentanyl 50 mcg IV, Versed 5 mg IV TOPICAL ANESTHETIC: Cetacaine spray  DESCRIPTION OF PROCEDURE:   After the risks benefits and alternatives of the procedure were thoroughly explained, informed consent was obtained.  The Pentax Gastroscope [id number]  endoscope was introduced through the mouth and advanced to the second portion of the duodenum      , limited by Without limitations.   The instrument was slowly withdrawn as the mucosa was fully examined.      FINDINGS:  Esophagus: Normal  Stomach: There was a 2 cm hiatal hernia. Within this hiatal hernia just below the esophagogastric junction there was a 1 cm raised lesion that appeared ulcerated, and somewhat vascular in appearance. It is unclear whether or not this is a mucosal prolapse or a polyp. The clip that had been placed at this area was no longer present. Because of the vascular nature of this lesion I elected not to biopsy it. The rest of the stomach was unremarkable.  Duodenum: Normal  COMPLICATIONS:none  ENDOSCOPIC IMPRESSION:see above. This lesion as noted above is ulcerated and vascular in appearance. It appears benign. It is unclear whether this represents a mucosal prolapse within the hiatal hernia area or even an inflammatory polyp. Because of its vascular appearance it was not biopsied as it does appear that it could bleed.   RECOMMENDATIONS:I would recommend continued PPI therapy. If this is an inflammatory lesion it should hopefully  resolve.      _______________________________ Rosalie DoctorWandalee Ferdinand, MD 04/18/2013 11:30 AM

## 2013-04-18 NOTE — Evaluation (Signed)
Physical Therapy One Time Evaluation/Discharge Patient Details Name: Barry Thompson MRN: [id number] DOB: Nov 12, 1923 Today's Date: 04/18/2013 Time: 4098-1191 PT Time Calculation (min): 31 min  PT Assessment / Plan / Recommendation History of Present Illness  79 y.o. male admitted to [facility] on 04/14/13 with anemia.  Workup being completed to find souce of bleed.  Suspected GIB.    Clinical Impression  Pt is moving at baseline level of functioning: walking with cane.  He was able to demonstrate adequate strength to get into and out of the house.  I believe when he gets home and back into his normal routine he will feel back to his usual self.  Also, as his blood levels improve this should help him feel stronger as well.  PT to sign off and encouraged pt to walk with his son and/or staff as he is able.      PT Assessment  Patent does not need any further PT services    Follow Up Recommendations  No PT follow up;Supervision - Intermittent    Does the patient have the potential to tolerate intense rehabilitation     Yes  Barriers to Discharge   None      Equipment Recommendations  None recommended by PT    Recommendations for Other Services   None  Frequency   NA-one time eval and discharge.     Precautions / Restrictions   none  Pertinent Vitals/Pain DOE 2/4 with gait. O2 sats 98% on RA.        Mobility  Bed Mobility Bed Mobility: Supine to Sit;Sitting - Scoot to Edge of Bed Supine to Sit: 7: Independent;HOB flat Sitting - Scoot to Edge of Bed: 7: Independent Details for Bed Mobility Assistance: no assist needed Transfers Transfers: Sit to Stand;Stand to Sit Sit to Stand: 6: Modified independent (Device/Increase time);With upper extremity assist;From bed Stand to Sit: 6: Modified independent (Device/Increase time);With upper extremity assist;To chair/3-in-1 Details for Transfer Assistance: used hands for support during transitions Ambulation/Gait Ambulation/Gait Assistance: 5:  Supervision Ambulation Distance (Feet): 200 Feet Assistive device: Straight cane Ambulation/Gait Assistance Details: supervision for safety due to increased DOE 2/4 (O2 sats 98%) and pt self report that he felt a little weaker "than at home"  Gait Pattern: Step-through pattern;Shuffle Gait velocity: 1.91 ft/sec (this puts him at a neighborhood ambulator level).   Stairs: Yes Stairs Assistance: 5: Supervision Stairs Assistance Details (indicate cue type and reason): supervision for safety especially while descending the stairs due to signs of weakness (less control of descent of body down the stairs).   Stair Management Technique: Two rails;Step to pattern;Forwards Number of Stairs: 9       PT Goals(Current goals can be found in the care plan section) Acute Rehab PT Goals PT Goal Formulation: No goals set, d/c therapy  Visit Information  Last PT Received On: 04/18/13 Assistance Needed: +1 History of Present Illness: 79 y.o. male admitted to [facility] on 04/14/13 with anemia.  Workup being completed to find souce of bleed.  Suspected GIB.         Prior Functioning  Home Living Family/patient expects to be discharged to:: Private residence Living Arrangements: Alone;Other (Comment) (wife is in SNF for rehab after a stroke) Available Help at Discharge: Family;Available PRN/intermittently Type of Home: House Home Access: Stairs to enter [employer] of Steps: 2 Entrance Stairs-Rails: None ("posts") Home Layout: Two level Alternate Level Stairs-Number of Steps: flight (pt and wife stay downstairs, storage and guest rooms upstair) Alternate Level Stairs-Rails: Right  Home Equipment: Gilmer Mor - single point Additional Comments: pt walks with a cane at baseline Prior Function Level of Independence: Independent with assistive device(s) Comments: decreased appetite and 30 lb weight loss recently.   Communication Communication: HOH Dominant Hand: Right    Cognition   Cognition Arousal/Alertness: Awake/alert Behavior During Therapy: WFL for tasks assessed/performed Overall Cognitive Status: Within Functional Limits for tasks assessed    Extremity/Trunk Assessment Upper Extremity Assessment Upper Extremity Assessment: Overall WFL for tasks assessed Lower Extremity Assessment Lower Extremity Assessment: Generalized weakness (per pt he has to concentrate on picking up his feet) Cervical / Trunk Assessment Cervical / Trunk Assessment: Normal      End of Session PT - End of Session Activity Tolerance: Patient tolerated treatment well Patient left: in chair;with call bell/phone within reach Nurse Communication: Mobility status    Lurena Joiner B. Tayton Decaire, PT, DPT [phone]   04/18/2013, 4:19 PM

## 2013-04-19 ENCOUNTER — Encounter (HOSPITAL_COMMUNITY): Payer: Self-pay | Admitting: Gastroenterology

## 2013-04-19 DIAGNOSIS — Z950 Presence of cardiac pacemaker: Secondary | ICD-10-CM

## 2013-04-19 DIAGNOSIS — I10 Essential (primary) hypertension: Secondary | ICD-10-CM

## 2013-04-19 LAB — CBC
HCT: 25.1 % — ABNORMAL LOW (ref 39.0–52.0)
Hemoglobin: 7.9 g/dL — ABNORMAL LOW (ref 13.0–17.0)
MCH: 26.4 pg (ref 26.0–34.0)
MCV: 83.9 fL (ref 78.0–100.0)
Platelets: 161 10*3/uL (ref 150–400)
RBC: 2.99 MIL/uL — ABNORMAL LOW (ref 4.22–5.81)
RDW: 14.6 % (ref 11.5–15.5)
WBC: 4.9 10*3/uL (ref 4.0–10.5)

## 2013-04-19 LAB — BASIC METABOLIC PANEL
BUN: 18 mg/dL (ref 6–23)
CO2: 27 mEq/L (ref 19–32)
Calcium: 8.5 mg/dL (ref 8.4–10.5)
Chloride: 103 mEq/L (ref 96–112)
Creatinine, Ser: 1.41 mg/dL — ABNORMAL HIGH (ref 0.50–1.35)
Glucose, Bld: 100 mg/dL — ABNORMAL HIGH (ref 70–99)
Potassium: 4.5 mEq/L (ref 3.5–5.1)
Sodium: 137 mEq/L (ref 135–145)

## 2013-04-19 MED ORDER — PANTOPRAZOLE SODIUM 40 MG PO TBEC: 40.0000 mg | DELAYED_RELEASE_TABLET | Freq: Every day | ORAL | Status: DC

## 2013-04-19 NOTE — Discharge Summary (Signed)
Physician Discharge Summary  MYLAN SCHWARZ RUE:[id number] DOB: 1923-10-19 DOA: 04/14/2013  PCP: Kimber Relic, MD  Admit date: 04/14/2013 Discharge date: 04/19/2013  Time spent: greater than 30 min  Recommendations for Outpatient Follow-up:  1. Repeat Hgb  Discharge Diagnoses:  Chronic GI blood loss from nodular lesion in hiatal hernia   HTN (hypertension)   Atrioventricular block, complete  assoc w TAVR   Atrial fibrillation     S/P aortic valve replacement   Chronic kidney disease, stage IV (severe)   Chronic diastolic congestive heart failure   Anemia due to chronic blood loss   Melena   Discharge Condition: stable  Filed Weights   04/15/13 0500 04/19/13 0532  Weight: 86.3 kg (190 lb 4.1 oz) 92 kg (202 lb 13.2 oz)    History of present illness:  79 year old male with a history of A. fib on anticoagulation with Xarelto, aortic stenosis status post aortic valve replacement, which was bioprosthetic valve, hypertension, see daily, status post pacemaker placement who came to the hospital from the primary care clinic after patient was found to be anemic with hemoglobin of 6.1. Patient lives with his wife at home, a month ago patient's wife suffered a stroke and is currently nursing home. Since the patient has not been able to eat and drink well, as he is living by himself. He also noticed that he has lost 13 pound weight, he was getting more week and he was having shortness of breath on exertion. Patient did not have blurred vision. He denies fever. No nausea vomiting or diarrhea. But she admits to having melena. No frank blood noted in the vomitus or stool. Patient is on anticoagulation for A. fib.    Hospital Course:  Patient was admitted to hospitalist service, GI consulted.  Started on PPI and bowel rest.  Transfused PRBC.  Xarelto and ASA stopped.  Remained hemodynamically stable.  AV paced rhythm on telemetry.  EGD showed friable nodular lesion w/ adherent clot within a hiatal  hernia. A hemoclip placed and patient remained stable on clear liquid diet without further bleeding for several days.  Repeat EGD showed clip gone and no bleeding.  Discussed with Dr. Excell Seltzer who recommends d/c xarelto, cont ASA 81 mg and f/u with cardiology in the office.   Procedures:  EGD 10/3 showed 1. No active bleeding or blood in the stomach at the time this exam.  2. Hemorrhagic, friable benign-appearing nodular lesion with  adherent clot in the patient's small hiatal hernia. Clips were  applied to help prevent further bleeding.  RECOMMENDATIONS:  1. Continue antipeptic therapy, although I doubt that this lesion  reflects a peptic process. Nonetheless, acid reduction may help  reduce tendency for inflammation and bleeding from this site.  2. Consider repeat endoscopy in a few days to check on status of  this lesion and thereby guide therapy concerning anticoagulation,  and possibly to obtain biopsies of it.  3. I think that this lesion may potentially be slow to heal, so the  patient may be at risk for rebleeding for quite some time. A  difficult decision will have to be made concerning whether, or  when, to resume the patient's Xarelto. to help in at this I would  confer with the patient's primary physician. I do think he could be  restarted on his aspirin therapy with reasonable safety at any  time.   EGD 10/6 showedStomach: There was a 2 cm hiatal hernia. Within this hiatal hernia  just below the  esophagogastric junction there was a 1 cm raised  lesion that appeared ulcerated, and somewhat vascular in  appearance. It is unclear whether or not this is a mucosal prolapse  or a polyp. The clip that had been placed at this area was no  longer present. Because of the vascular nature of this lesion I  elected not to biopsy it. The rest of the stomach was unremarkable.  Duodenum: Normal  COMPLICATIONS:none  ENDOSCOPIC IMPRESSION:see above. This lesion as noted above is   ulcerated and vascular in appearance. It appears benign. It is  unclear whether this represents a mucosal prolapse within the  hiatal hernia area or even an inflammatory polyp. Because of its  vascular appearance it was not biopsied as it does appear that it  could bleed.  RECOMMENDATIONS:I would recommend continued PPI therapy. If this is  an inflammatory lesion it should hopefully resolve.   Consultations:  [facility]  Discharge Exam: Filed Vitals:   04/19/13 0532  BP: 127/48  Pulse: 55  Temp: 98.3 F (36.8 C)  Resp: 16    General: alert oriented, walking around the unit Cardiovascular: RRR without MGR Respiratory: CTA without WRR  Discharge Instructions  Discharge Orders   Future Appointments Provider Department Dept Phone   04/26/2013 1:15 PM Kimber Relic, MD [facility] [phone]   05/04/2013 3:00 PM Wendall Stade, MD [facility] [phone]   Future Orders Complete By Expires   Activity as tolerated - No restrictions  As directed    Diet - low sodium heart healthy  As directed        Medication List    STOP taking these medications       multivitamin capsule     Rivaroxaban 15 MG Tabs tablet  Commonly known as:  XARELTO      TAKE these medications       ALPRAZolam 0.25 MG tablet  Commonly known as:  XANAX  TAKE 1 TABLET BY MOUTH UP TO 3 TIMES DAILY AS NEEDED FOR NERVES     amiodarone 200 MG tablet  Commonly known as:  PACERONE  Take 200 mg by mouth daily.     aspirin EC 81 MG tablet  Take 81 mg by mouth daily.     atorvastatin 20 MG tablet  Commonly known as:  LIPITOR  Take 20 mg by mouth daily.     bisacodyl 5 MG EC tablet  Commonly known as:  DULCOLAX  Take 5 mg by mouth daily.     carvedilol 25 MG tablet  Commonly known as:  COREG  Take 2 tablets every 12 hours for blood pressure     cyanocobalamin 100 MCG tablet  Take 100 mcg by mouth daily.     Fish Oil 1000 MG Caps  Take by mouth 2 (two) times  daily.     levothyroxine 100 MCG tablet  Commonly known as:  SYNTHROID, LEVOTHROID  Take 100 mcg by mouth daily before breakfast.     pantoprazole 40 MG tablet  Commonly known as:  PROTONIX  Take 1 tablet (40 mg total) by mouth daily.     zolpidem 10 MG tablet  Commonly known as:  AMBIEN  TAKE 1 TABLET BY MOUTH AT BEDTIME AS NEEDED FOR SLEEP       Allergies  Allergen Reactions  . Mold Extract [Trichophyton]        Follow-up Information   Follow up with [name], MD On 05/04/2013. (@ 3:00 pm)  Specialty:  Cardiology   Contact information:   [address] [place] [address] [phone]       Follow up with GREEN, [name], MD In 1 week. (to check hemoglobin)    Specialty:  Internal Medicine   Contact information:   [address] Jeanella Anton [place] [address] [phone]        The results of significant diagnostics from this hospitalization (including imaging, microbiology, ancillary and laboratory) are listed below for reference.    Significant Diagnostic Studies: No results found.  Microbiology: Recent Results (from the past 240 hour(s))  MRSA PCR SCREENING     Status: None   Collection Time    04/14/13  6:37 PM      Result Value Range Status   MRSA by PCR NEGATIVE  NEGATIVE Final   Comment:            The GeneXpert MRSA Assay (FDA     approved for NASAL specimens     only), is one component of a     comprehensive MRSA colonization     surveillance program. It is not     intended to diagnose MRSA     infection nor to guide or     monitor treatment for     MRSA infections.     Labs: Basic Metabolic Panel:  Recent Labs Lab 04/14/13 1410 04/15/13 0400 04/17/13 0630 04/19/13 0430  NA 136 139 138 137  K 4.4 4.4 4.4 4.5  CL 99 104 104 103  CO2 26 26 28 27   GLUCOSE 123* 90 91 100*  BUN 51* 40* 21 18  CREATININE 2.07* 1.75* 1.38* 1.41*  CALCIUM 9.0 8.5 8.4 8.5   Liver Function Tests:  Recent Labs Lab 04/15/13 0400   AST 22  ALT 23  ALKPHOS 38*  BILITOT 1.2  PROT 5.6*  ALBUMIN 3.1*   No results found for this basename: LIPASE, AMYLASE,  in the last 168 hours No results found for this basename: AMMONIA,  in the last 168 hours CBC:  Recent Labs Lab 04/13/13 1328 04/14/13 1410 04/15/13 0400  04/15/13 2027 04/16/13 0503 04/17/13 0630 04/18/13 0525 04/19/13 0430  WBC 6.8 5.9 7.9  --  8.9 7.9  --   --  4.9  NEUTROABS 4.8 3.9  --   --   --   --   --   --   --   HGB 6.8* 6.1* 7.7*  < > 8.0* 7.7* 7.4* 7.6* 7.9*  HCT 21.5* 19.5* 24.1*  < > 24.5* 23.9* 23.1* 23.7* 25.1*  MCV 87 85.9 85.8  --  85.1 85.1  --   --  83.9  PLT  --  223 182  --  174 180  --   --  161  < > = values in this interval not displayed. Cardiac Enzymes: No results found for this basename: CKTOTAL, CKMB, CKMBINDEX, TROPONINI,  in the last 168 hours BNP: BNP (last 3 results) No results found for this basename: PROBNP,  in the last 8760 hours CBG: No results found for this basename: GLUCAP,  in the last 168 hours  EKG Sinus or ectopic atrial rhythm Left bundle branch block  Signed:  Paddy Neis L  [facility] 04/19/2013, 9:49 AM

## 2013-04-26 ENCOUNTER — Encounter: Payer: Self-pay | Admitting: Internal Medicine

## 2013-04-26 ENCOUNTER — Ambulatory Visit (INDEPENDENT_AMBULATORY_CARE_PROVIDER_SITE_OTHER): Payer: Medicare Other | Admitting: Internal Medicine

## 2013-04-26 VITALS — BP 140/76 | HR 85 | Temp 97.7°F | Wt 197.0 lb

## 2013-04-26 DIAGNOSIS — I10 Essential (primary) hypertension: Secondary | ICD-10-CM

## 2013-04-26 DIAGNOSIS — N184 Chronic kidney disease, stage 4 (severe): Secondary | ICD-10-CM

## 2013-04-26 DIAGNOSIS — I4891 Unspecified atrial fibrillation: Secondary | ICD-10-CM

## 2013-04-26 DIAGNOSIS — Z954 Presence of other heart-valve replacement: Secondary | ICD-10-CM

## 2013-04-26 DIAGNOSIS — D500 Iron deficiency anemia secondary to blood loss (chronic): Secondary | ICD-10-CM

## 2013-04-26 DIAGNOSIS — Z952 Presence of prosthetic heart valve: Secondary | ICD-10-CM

## 2013-04-26 MED ORDER — AMIODARONE HCL 200 MG PO TABS: 200.0000 mg | ORAL_TABLET | Freq: Every day | ORAL | Status: DC

## 2013-04-26 NOTE — Progress Notes (Signed)
Subjective:    Patient ID: Barry Thompson, male    DOB: July 02, 1924, 79 y.o.   MRN: [id number]  HPI Hospitalized 04/14/13- 04/19/13.forprofound anemia.  Discharge Diagnoses:    Chronic GI blood loss from nodular lesion in hiatal hernia    HTN (hypertension)    Atrioventricular block, complete  assoc w TAVR    Atrial fibrillation       S/P aortic valve replacement    Chronic kidney disease, stage IV (severe)    Chronic diastolic congestive heart failure    Anemia due to chronic blood loss    Melena   Procedures:  EGD 10/3 showed 1. No active bleeding or blood in the stomach at the time this exam.   2. Hemorrhagic, friable benign-appearing nodular lesion with   adherent clot in the patient's small hiatal hernia. Clips were   applied to help prevent further bleeding.   RECOMMENDATIONS:   1. Continue antipeptic therapy, although I doubt that this lesion   reflects a peptic process. Nonetheless, acid reduction may help   reduce tendency for inflammation and bleeding from this site.   2. Consider repeat endoscopy in a few days to check on status of   this lesion and thereby guide therapy concerning anticoagulation,   and possibly to obtain biopsies of it.   3. I think that this lesion may potentially be slow to heal, so the   patient may be at risk for rebleeding for quite some time. A   difficult decision will have to be made concerning whether, or   when, to resume the patient's Xarelto. to help in at this I would   confer with the patient's primary physician. I do think he could be   restarted on his aspirin therapy with reasonable safety at any   time.   EGD 10/6 showedStomach: There was a 2 cm hiatal hernia. Within this hiatal hernia   just below the esophagogastric junction there was a 1 cm raised   lesion that appeared ulcerated, and somewhat vascular in   appearance. It is unclear whether or not this is a mucosal prolapse   or a polyp. The clip that had been placed at this  area was no   longer present. Because of the vascular nature of this lesion I   elected not to biopsy it. The rest of the stomach was unremarkable.   Duodenum: Normal   COMPLICATIONS:none   ENDOSCOPIC IMPRESSION:see above. This lesion as noted above is   ulcerated and vascular in appearance. It appears benign. It is   unclear whether this represents a mucosal prolapse within the   hiatal hernia area or even an inflammatory polyp. Because of its   vascular appearance it was not biopsied as it does appear that it   could bleed.   RECOMMENDATIONS:I would recommend continued PPI therapy. If this is   an inflammatory lesion it should hopefully resolve.   Hospital Course:  Patient was admitted to hospitalist service, GI consulted.  Started on PPI and bowel rest. Transfused PRBC.  Xarelto and ASA stopped.  Remained hemodynamically stable.  AV paced rhythm on telemetry.  EGD showed friable nodular lesion w/ adherent clot within a hiatal hernia. A hemoclip placed and patient remained stable on clear liquid diet without further bleeding for several days.  Repeat EGD showed clip gone and no bleeding.  Discussed with Dr. Excell Seltzer who recommends d/c xarelto, cont ASA 81 mg and f/u with cardiology in the office Hospital Course:  Patient was  admitted to hospitalist service, GI consulted.  Started on PPI and bowel rest. Transfused PRBC.  Xarelto and ASA stopped.  Remained hemodynamically stable.  AV paced rhythm on telemetry.  EGD showed friable nodular lesion w/ adherent clot within a hiatal hernia. A hemoclip placed and patient remained stable on clear liquid diet without further bleeding for several days.  Repeat EGD showed clip gone and no bleeding.  Discussed with Dr. Excell Seltzer who recommends d/c xarelto, cont ASA 81 mg and f/u with cardiology in the office  Since return home, he has done very well. Appetite is better. Energy is better. Denies chest pain or dyspnea. Stools are normal in color and he is slightly  constipated.    Current Outpatient Prescriptions on File Prior to Visit  Medication Sig Dispense Refill  . ALPRAZolam (XANAX) 0.25 MG tablet TAKE 1 TABLET BY MOUTH UP TO 3 TIMES DAILY AS NEEDED FOR NERVES  100 tablet  1  . amiodarone (PACERONE) 200 MG tablet Take 200 mg by mouth daily.      Marland Kitchen aspirin EC 81 MG tablet Take 81 mg by mouth daily.      Marland Kitchen atorvastatin (LIPITOR) 20 MG tablet Take 20 mg by mouth daily.      . bisacodyl (DULCOLAX) 5 MG EC tablet Take 5 mg by mouth daily.      . carvedilol (COREG) 25 MG tablet Take 2 tablets every 12 hours for blood pressure  60 tablet  5  . cyanocobalamin 100 MCG tablet Take 100 mcg by mouth daily.      Marland Kitchen levothyroxine (SYNTHROID, LEVOTHROID) 100 MCG tablet Take 100 mcg by mouth daily before breakfast.      . Omega-3 Fatty Acids (FISH OIL) 1000 MG CAPS Take by mouth 2 (two) times daily.      . pantoprazole (PROTONIX) 40 MG tablet Take 1 tablet (40 mg total) by mouth daily.  30 tablet  0  . zolpidem (AMBIEN) 10 MG tablet TAKE 1 TABLET BY MOUTH AT BEDTIME AS NEEDED FOR SLEEP  30 tablet  5   No current facility-administered medications on file prior to visit.    Review of Systems  Constitutional: Positive for fatigue. Negative for activity change, appetite change and unexpected weight change.  HENT: Positive for hearing loss and tinnitus. Negative for congestion and ear pain.   Eyes: Negative.   Respiratory: Positive for shortness of breath. Negative for chest tightness.        History of dyspnea in the past typically with exertion. He feels he is doing better with his.  Cardiovascular: Negative for chest pain, palpitations and leg swelling.       History of aortic valve replacement. History of pacemaker implantation.  Gastrointestinal: Positive for constipation. Negative for abdominal pain and abdominal distention.       Poor appetite.  Endocrine: Negative.   Genitourinary:       History prostate cancer, urinary frequency, and urgency. He is  impotent. He has nocturia x2-3. In the daytime he voids every 90 minutes.  Musculoskeletal: Negative.  Negative for neck pain and neck stiffness.  Skin: Positive for pallor. Negative for rash and wound.  Neurological: Positive for tremors. Negative for syncope, weakness and numbness.       Mild forgetfulness.  Hematological: Negative.   Psychiatric/Behavioral: The patient is nervous/anxious.        Objective:BP 140/76  Pulse 85  Temp(Src) 97.7 F (36.5 C) (Oral)  Wt 197 lb (89.359 kg)  BMI 26 kg/m2  SpO2  98%    Physical Exam  Constitutional: He is oriented to person, place, and time. He appears well-developed and well-nourished. No distress.  HENT:  Head: Normocephalic and atraumatic.  Right Ear: External ear normal.  Left Ear: External ear normal.  Nose: Nose normal.  Mouth/Throat: Oropharynx is clear and moist.  Partial deafness bilaterally.  Eyes: Conjunctivae and EOM are normal. Pupils are equal, round, and reactive to light.  Wears corrective lenses.  Neck: No JVD present. No tracheal deviation present. No thyromegaly present.  Cardiovascular: Normal rate, regular rhythm and intact distal pulses.  Exam reveals no gallop and no friction rub.   2/6 systolic ejection murmur at the base of the heart. Pacemaker left upper anterior chest. Audible systolic click.  Pulmonary/Chest: No respiratory distress. He has no wheezes. He exhibits no tenderness.  Breathing is even and unlabored at rest.  Abdominal: He exhibits no distension and no mass. There is no tenderness.  Musculoskeletal: Normal range of motion. He exhibits no edema and no tenderness.  Lymphadenopathy:    He has no cervical adenopathy.  Neurological: He is alert and oriented to person, place, and time. No cranial nerve deficit. Coordination normal.  Resting tremor bilaterally. Worse on the right side. 01/17/2009 MMSE 25/30. Passed clock drawing.  Skin: No rash noted. No erythema. No pallor.  Multiple seborrheic  keratoses. Common Nevus left neck posterior to the ear.  Psychiatric: He has a normal mood and affect. His behavior is normal. Judgment and thought content normal.          Assessment & Plan:  Anemia due to chronic blood loss - Plan: CBC with Differential  Chronic kidney disease, stage IV (severe) - Plan: Basic metabolic panel  S/P aortic valve replacement: unchanged  Atrial fibrillation   - Plan: amiodarone (PACERONE) 200 MG tablet  HTN (hypertension): controlled

## 2013-04-26 NOTE — Addendum Note (Signed)
Addended by: Marvia Pickles on: 04/26/2013 02:09 PM   Modules accepted: Orders

## 2013-04-26 NOTE — Patient Instructions (Signed)
Continue current medications. 

## 2013-04-27 ENCOUNTER — Encounter: Payer: Self-pay | Admitting: *Deleted

## 2013-04-27 LAB — CBC WITH DIFFERENTIAL/PLATELET
Basophils Absolute: 0 10*3/uL (ref 0.0–0.2)
Eosinophils Absolute: 0.2 10*3/uL (ref 0.0–0.4)
HCT: 27.9 % — ABNORMAL LOW (ref 37.5–51.0)
Immature Grans (Abs): 0 10*3/uL (ref 0.0–0.1)
Immature Granulocytes: 0 %
Lymphocytes Absolute: 1 10*3/uL (ref 0.7–3.1)
Lymphs: 20 %
MCH: 24.9 pg — ABNORMAL LOW (ref 26.6–33.0)
MCHC: 30.5 g/dL — ABNORMAL LOW (ref 31.5–35.7)
MCV: 82 fL (ref 79–97)
Monocytes Absolute: 0.6 10*3/uL (ref 0.1–0.9)
Neutrophils Relative %: 64 %
RBC: 3.41 x10E6/uL — ABNORMAL LOW (ref 4.14–5.80)
RDW: 17 % — ABNORMAL HIGH (ref 12.3–15.4)

## 2013-04-27 LAB — BASIC METABOLIC PANEL
BUN/Creatinine Ratio: 13 (ref 10–22)
CO2: 28 mmol/L (ref 18–29)
Calcium: 8.8 mg/dL (ref 8.6–10.2)
Chloride: 99 mmol/L (ref 97–108)
Creatinine, Ser: 1.65 mg/dL — ABNORMAL HIGH (ref 0.76–1.27)
GFR calc non Af Amer: 37 mL/min/{1.73_m2} — ABNORMAL LOW (ref >59)
Glucose: 119 mg/dL — ABNORMAL HIGH (ref 65–99)
Potassium: 4 mmol/L (ref 3.5–5.2)
Sodium: 142 mmol/L (ref 134–144)

## 2013-05-04 ENCOUNTER — Encounter: Payer: Self-pay | Admitting: Cardiovascular Disease

## 2013-05-04 ENCOUNTER — Ambulatory Visit (INDEPENDENT_AMBULATORY_CARE_PROVIDER_SITE_OTHER): Payer: Medicare Other | Admitting: Cardiovascular Disease

## 2013-05-04 VITALS — BP 138/88 | HR 89 | Ht 73.0 in | Wt 200.0 lb

## 2013-05-04 DIAGNOSIS — E782 Mixed hyperlipidemia: Secondary | ICD-10-CM

## 2013-05-04 DIAGNOSIS — I2510 Atherosclerotic heart disease of native coronary artery without angina pectoris: Secondary | ICD-10-CM

## 2013-05-04 DIAGNOSIS — I4891 Unspecified atrial fibrillation: Secondary | ICD-10-CM

## 2013-05-04 DIAGNOSIS — Z952 Presence of prosthetic heart valve: Secondary | ICD-10-CM

## 2013-05-04 DIAGNOSIS — Z954 Presence of other heart-valve replacement: Secondary | ICD-10-CM

## 2013-05-04 MED ORDER — WARFARIN SODIUM 2.5 MG PO TABS: 2.5000 mg | ORAL_TABLET | ORAL | Status: DC

## 2013-05-04 NOTE — Progress Notes (Signed)
Patient ID: Barry Thompson, male   DOB: 1923-11-06, 79 y.o.   MRN: [id number] 79 yo with history of AS Recent Corevlave 29mm done at [facility]. Complicated by heart block and had pacer placed Cath prior to TAVR with moderate disease not needing intervention  Final Conclusions:  1. Moderate coronary artery disease as outlined above with moderate stenosis of the mid LAD, severe stenosis of the left circumflex obtuse marginal subbranch, and nonobstructive disease of the right coronary artery  2. Moderately severe aortic stenosis  3. Elevated left ventricular filling pressures  Had anemia and upper GI bleeding earlier this month  EGD showed unusual nodular lesion in hiatal hernia that was clipped.  F/U endo 32 days latter 10/6 showed no bleeding During hospitalization xarelto and ASA stopped.  He has not had any recurrent bleeding or dark stools.  Xarelto started by [facility].  He does not have critical CAD and more important  To anticoagulate for risk of stroke in chronic afib  Safer to start with coumadin and if no bleeding on Rx INR can switch to xarelto or eliaquis.  Son really does not like the hassel of checking PT;s but seems to understand that the novel agents are not reversible   ROS: Denies fever, malais, weight loss, blurry vision, decreased visual acuity, cough, sputum, SOB, hemoptysis, pleuritic pain, palpitaitons, heartburn, abdominal pain, melena, lower extremity edema, claudication, or rash.  All other systems reviewed and negative  General: Affect appropriate Healthy:  appears stated age HEENT: normal Neck supple with no adenopathy JVP normal no bruits no thyromegaly Lungs clear with no wheezing and good diaphragmatic motion Heart:  S1/S2 SEM / AR  murmur, no rub, gallop or click PMI normal Abdomen: benighn, BS positve, no tenderness, no AAA no bruit.  No HSM or HJR Distal pulses intact with no bruits No edema Neuro non-focal Skin warm and dry No muscular weakness   Current Outpatient  Prescriptions  Medication Sig Dispense Refill  . ALPRAZolam (XANAX) 0.25 MG tablet TAKE 1 TABLET BY MOUTH UP TO 3 TIMES DAILY AS NEEDED FOR NERVES  100 tablet  1  . amiodarone (PACERONE) 200 MG tablet Take 1 tablet (200 mg total) by mouth daily.  30 tablet  5  . aspirin EC 81 MG tablet Take 81 mg by mouth daily.      Marland Kitchen atorvastatin (LIPITOR) 20 MG tablet Take 20 mg by mouth daily.      . bisacodyl (DULCOLAX) 5 MG EC tablet Take 5 mg by mouth daily.      . carvedilol (COREG) 25 MG tablet Take 2 tablets every 12 hours for blood pressure  60 tablet  5  . cyanocobalamin 100 MCG tablet Take 100 mcg by mouth daily.      Marland Kitchen levothyroxine (SYNTHROID, LEVOTHROID) 100 MCG tablet Take 100 mcg by mouth daily before breakfast.      . Omega-3 Fatty Acids (FISH OIL) 1000 MG CAPS Take by mouth 2 (two) times daily.      . pantoprazole (PROTONIX) 40 MG tablet Take 1 tablet (40 mg total) by mouth daily.  30 tablet  0  . zolpidem (AMBIEN) 10 MG tablet TAKE 1 TABLET BY MOUTH AT BEDTIME AS NEEDED FOR SLEEP  30 tablet  5   No current facility-administered medications for this visit.    Allergies  Mold extract  Electrocardiogram:  afib LBBB   Assessment and Plan

## 2013-05-04 NOTE — Assessment & Plan Note (Signed)
TAVR with low gradients  58mm/17mm Hg 12/13  Mild peri prosthetic leak.  SBE prophylaxis

## 2013-05-04 NOTE — Assessment & Plan Note (Signed)
Cholesterol is at goal.  Continue current dose of statin and diet Rx.  No myalgias or side effects.  F/U  LFT's in 6 months. Lab Results  Component Value Date   LDLCALC 62 01/03/2013             

## 2013-05-04 NOTE — Assessment & Plan Note (Signed)
Good rate control and pacer back up.  Very high [place] score.  GI lesion seems to be Rx well Restart coumadin.  If no bleeding with Rx INR over the course of 2 months can consider changing back to xarelto or eliquis

## 2013-05-04 NOTE — Assessment & Plan Note (Signed)
Stable with no angina and good activity level.  Continue medical Rx.  Stop aspirin as it is less needed than anticoagulation for stroke prevention in afib

## 2013-05-04 NOTE — Patient Instructions (Signed)
Your physician recommends that you schedule a follow-up appointment in: 10  -12 WEEKS  WITH  DR [facility] Your physician has recommended you make the following change in your medication:  STOP ASPIRIN  START  WARFARIN  2.5 MG   AS  DIRECTED COUMADIN CLINIC  NEXT  WEEK

## 2013-05-12 ENCOUNTER — Ambulatory Visit (INDEPENDENT_AMBULATORY_CARE_PROVIDER_SITE_OTHER): Payer: Medicare Other | Admitting: *Deleted

## 2013-05-12 DIAGNOSIS — Z954 Presence of other heart-valve replacement: Secondary | ICD-10-CM

## 2013-05-12 DIAGNOSIS — Z952 Presence of prosthetic heart valve: Secondary | ICD-10-CM

## 2013-05-12 DIAGNOSIS — I4891 Unspecified atrial fibrillation: Secondary | ICD-10-CM

## 2013-05-19 ENCOUNTER — Other Ambulatory Visit: Payer: Self-pay | Admitting: Internal Medicine

## 2013-05-19 ENCOUNTER — Ambulatory Visit (INDEPENDENT_AMBULATORY_CARE_PROVIDER_SITE_OTHER): Payer: Medicare Other | Admitting: *Deleted

## 2013-05-19 DIAGNOSIS — Z954 Presence of other heart-valve replacement: Secondary | ICD-10-CM

## 2013-05-19 DIAGNOSIS — Z952 Presence of prosthetic heart valve: Secondary | ICD-10-CM

## 2013-05-19 DIAGNOSIS — I4891 Unspecified atrial fibrillation: Secondary | ICD-10-CM

## 2013-05-26 ENCOUNTER — Ambulatory Visit (INDEPENDENT_AMBULATORY_CARE_PROVIDER_SITE_OTHER): Payer: Medicare Other | Admitting: *Deleted

## 2013-05-26 DIAGNOSIS — I4891 Unspecified atrial fibrillation: Secondary | ICD-10-CM

## 2013-05-26 DIAGNOSIS — Z954 Presence of other heart-valve replacement: Secondary | ICD-10-CM

## 2013-05-26 DIAGNOSIS — Z952 Presence of prosthetic heart valve: Secondary | ICD-10-CM

## 2013-05-26 LAB — POCT INR: INR: 3.2

## 2013-06-01 ENCOUNTER — Other Ambulatory Visit: Payer: Self-pay | Admitting: Internal Medicine

## 2013-06-02 ENCOUNTER — Ambulatory Visit (INDEPENDENT_AMBULATORY_CARE_PROVIDER_SITE_OTHER): Payer: Medicare Other | Admitting: Pharmacist

## 2013-06-02 DIAGNOSIS — Z952 Presence of prosthetic heart valve: Secondary | ICD-10-CM

## 2013-06-02 DIAGNOSIS — Z954 Presence of other heart-valve replacement: Secondary | ICD-10-CM

## 2013-06-02 DIAGNOSIS — I4891 Unspecified atrial fibrillation: Secondary | ICD-10-CM

## 2013-06-02 LAB — POCT INR: INR: 2.6

## 2013-06-06 ENCOUNTER — Other Ambulatory Visit: Payer: Medicare Other

## 2013-06-06 DIAGNOSIS — I10 Essential (primary) hypertension: Secondary | ICD-10-CM

## 2013-06-06 DIAGNOSIS — I4891 Unspecified atrial fibrillation: Secondary | ICD-10-CM

## 2013-06-06 DIAGNOSIS — D500 Iron deficiency anemia secondary to blood loss (chronic): Secondary | ICD-10-CM

## 2013-06-07 LAB — CBC WITH DIFFERENTIAL/PLATELET
Basophils Absolute: 0.1 10*3/uL (ref 0.0–0.2)
Basos: 1 %
Eos: 2 %
HCT: 31.7 % — ABNORMAL LOW (ref 37.5–51.0)
Hemoglobin: 9.4 g/dL — ABNORMAL LOW (ref 12.6–17.7)
Lymphocytes Absolute: 1.6 10*3/uL (ref 0.7–3.1)
MCHC: 29.7 g/dL — ABNORMAL LOW (ref 31.5–35.7)
MCV: 73 fL — ABNORMAL LOW (ref 79–97)
Monocytes Absolute: 0.7 10*3/uL (ref 0.1–0.9)
Monocytes: 11 %
Neutrophils Absolute: 4 10*3/uL (ref 1.4–7.0)
RBC: 4.37 x10E6/uL (ref 4.14–5.80)
WBC: 6.4 10*3/uL (ref 3.4–10.8)

## 2013-06-07 LAB — COMPREHENSIVE METABOLIC PANEL
ALT: 36 IU/L (ref 0–44)
Albumin: 4 g/dL (ref 3.5–4.7)
Alkaline Phosphatase: 66 IU/L (ref 39–117)
BUN/Creatinine Ratio: 18 (ref 10–22)
BUN: 28 mg/dL — ABNORMAL HIGH (ref 8–27)
Creatinine, Ser: 1.54 mg/dL — ABNORMAL HIGH (ref 0.76–1.27)
GFR calc Af Amer: 46 mL/min/{1.73_m2} — ABNORMAL LOW (ref >59)
Globulin, Total: 2.5 g/dL (ref 1.5–4.5)
Glucose: 92 mg/dL (ref 65–99)
Total Bilirubin: 0.3 mg/dL (ref 0.0–1.2)
Total Protein: 6.5 g/dL (ref 6.0–8.5)

## 2013-06-08 ENCOUNTER — Other Ambulatory Visit: Payer: Self-pay

## 2013-06-16 ENCOUNTER — Ambulatory Visit (INDEPENDENT_AMBULATORY_CARE_PROVIDER_SITE_OTHER): Payer: Medicare Other | Admitting: *Deleted

## 2013-06-16 DIAGNOSIS — I4891 Unspecified atrial fibrillation: Secondary | ICD-10-CM

## 2013-06-16 DIAGNOSIS — Z952 Presence of prosthetic heart valve: Secondary | ICD-10-CM

## 2013-06-16 DIAGNOSIS — Z954 Presence of other heart-valve replacement: Secondary | ICD-10-CM

## 2013-06-20 ENCOUNTER — Other Ambulatory Visit: Payer: Self-pay | Admitting: Internal Medicine

## 2013-06-20 ENCOUNTER — Other Ambulatory Visit: Payer: Self-pay | Admitting: Nurse Practitioner

## 2013-07-08 ENCOUNTER — Other Ambulatory Visit: Payer: Self-pay | Admitting: Internal Medicine

## 2013-07-11 ENCOUNTER — Ambulatory Visit (INDEPENDENT_AMBULATORY_CARE_PROVIDER_SITE_OTHER): Payer: Medicare Other

## 2013-07-11 ENCOUNTER — Ambulatory Visit (INDEPENDENT_AMBULATORY_CARE_PROVIDER_SITE_OTHER): Payer: Medicare Other | Admitting: Cardiovascular Disease

## 2013-07-11 ENCOUNTER — Encounter: Payer: Self-pay | Admitting: Cardiovascular Disease

## 2013-07-11 VITALS — BP 140/82 | HR 86 | Wt 202.0 lb

## 2013-07-11 DIAGNOSIS — Z954 Presence of other heart-valve replacement: Secondary | ICD-10-CM

## 2013-07-11 DIAGNOSIS — I4891 Unspecified atrial fibrillation: Secondary | ICD-10-CM

## 2013-07-11 DIAGNOSIS — I10 Essential (primary) hypertension: Secondary | ICD-10-CM

## 2013-07-11 DIAGNOSIS — Z952 Presence of prosthetic heart valve: Secondary | ICD-10-CM

## 2013-07-11 DIAGNOSIS — I442 Atrioventricular block, complete: Secondary | ICD-10-CM

## 2013-07-11 DIAGNOSIS — I2510 Atherosclerotic heart disease of native coronary artery without angina pectoris: Secondary | ICD-10-CM

## 2013-07-11 DIAGNOSIS — E782 Mixed hyperlipidemia: Secondary | ICD-10-CM

## 2013-07-11 LAB — POCT INR: INR: 2.3

## 2013-07-11 NOTE — Assessment & Plan Note (Signed)
Stented core valve delivered from leg.  Last year echo from [facility] with mean gradient 9 mmHg and mild AR.  He is due to go back to [facility] 07/22/12 and will likely have repeat echo at that time Overall has had an excellent result

## 2013-07-11 NOTE — Progress Notes (Signed)
Patient ID: Barry Thompson, male   DOB: January 03, 1924, 79 y.o.   MRN: [id number] 79 yo with history of AS Recent Corevlave 29mm done at [facility]. Complicated by heart block and had pacer placed Cath prior to TAVR with moderate disease not needing intervention  Final Conclusions:  1. Moderate coronary artery disease as outlined above with moderate stenosis of the mid LAD, severe stenosis of the left circumflex obtuse marginal subbranch, and nonobstructive disease of the right coronary artery  2. Moderately severe aortic stenosis  3. Elevated left ventricular filling pressures  Had anemia and upper GI bleeding earlier this month EGD showed unusual nodular lesion in hiatal hernia that was clipped. F/U endo 32 days latter 10/6 showed no bleeding  During hospitalization xarelto and ASA stopped. He has not had any recurrent bleeding or dark stools. Xarelto started by [facility]. He does not have critical CAD and more important  To anticoagulate for risk of stroke in chronic afib Safer to start with coumadin and if no bleeding on Rx INR can switch to xarelto or eliaquis. Son really does not like the hassel of checking PT;s but seems to understand that the novel agents are not reversible   INR;s Rx  Pacer check does not mention afib      ROS: Denies fever, malais, weight loss, blurry vision, decreased visual acuity, cough, sputum, SOB, hemoptysis, pleuritic pain, palpitaitons, heartburn, abdominal pain, melena, lower extremity edema, claudication, or rash.  All other systems reviewed and negative  General: Affect appropriate Elderly male with poor memory HEENT: normal Neck supple with no adenopathy JVP normal no bruits no thyromegaly Lungs clear with no wheezing and good diaphragmatic motion Heart:  S1/S2 AR murmur, no rub, gallop or click PMI normal Abdomen: benighn, BS positve, no tenderness, no AAA no bruit.  No HSM or HJR Distal pulses intact with no bruits No edema Neuro non-focal Skin warm and dry No  muscular weakness   Current Outpatient Prescriptions  Medication Sig Dispense Refill  . ALPRAZolam (XANAX) 0.25 MG tablet TAKE 1 TABLET BY MOUTH UP TO 3 TIMES DAILY AS NEEDED FOR NERVES  100 tablet  0  . amiodarone (PACERONE) 200 MG tablet Take 1 tablet (200 mg total) by mouth daily.  30 tablet  5  . atorvastatin (LIPITOR) 20 MG tablet Take 20 mg by mouth daily.      . bisacodyl (DULCOLAX) 5 MG EC tablet Take 5 mg by mouth daily.      . carvedilol (COREG) 25 MG tablet Take 2 tablets every 12 hours for blood pressure  60 tablet  5  . cyanocobalamin 100 MCG tablet Take 100 mcg by mouth daily.      Marland Kitchen levothyroxine (SYNTHROID, LEVOTHROID) 100 MCG tablet Take 100 mcg by mouth daily before breakfast.      . Omega-3 Fatty Acids (FISH OIL) 1000 MG CAPS Take by mouth 2 (two) times daily.      . pantoprazole (PROTONIX) 40 MG tablet TAKE 1 TABLET BY MOUTH EVERY DAY  30 tablet  1  . spironolactone (ALDACTONE) 25 MG tablet TAKE 1 TABLET (25 MG TOTAL) BY MOUTH NIGHTLY.  30 tablet  2  . warfarin (COUMADIN) 2.5 MG tablet Take 1 tablet (2.5 mg total) by mouth as directed.  30 tablet  3  . zolpidem (AMBIEN) 10 MG tablet TAKE 1 TABLET BY MOUTH AT BEDTIME AS NEEDED FOR SLEEP  30 tablet  5   No current facility-administered medications for this visit.    Allergies  Mold extract  Electrocardiogram:  afib with v pacing   Assessment and Plan

## 2013-07-11 NOTE — Assessment & Plan Note (Signed)
Stable with no angina and good activity level.  Continue medical Rx  

## 2013-07-11 NOTE — Assessment & Plan Note (Signed)
F/U pacer clinic  Normal function AV block post TAVR

## 2013-07-11 NOTE — Assessment & Plan Note (Signed)
Continue beta blockade  Discussed eliquis at 2.5 bid and I think patient has gotten use to INR checks With no bleeding issues and stable coumadin dose will remain on this

## 2013-07-11 NOTE — Assessment & Plan Note (Signed)
Well controlled.  Continue current medications and low sodium Dash type diet.    

## 2013-07-11 NOTE — Assessment & Plan Note (Signed)
Cholesterol is at goal.  Continue current dose of statin and diet Rx.  No myalgias or side effects.  F/U  LFT's in 6 months. Lab Results  Component Value Date   LDLCALC 62 01/03/2013

## 2013-07-11 NOTE — Patient Instructions (Signed)
Your physician wants you to follow-up in:  6 MONTHS WITH DR NISHAN  You will receive a reminder letter in the mail two months in advance. If you don't receive a letter, please call our office to schedule the follow-up appointment. Your physician recommends that you continue on your current medications as directed. Please refer to the Current Medication list given to you today. 

## 2013-07-15 ENCOUNTER — Encounter: Payer: Self-pay | Admitting: *Deleted

## 2013-07-19 ENCOUNTER — Ambulatory Visit (INDEPENDENT_AMBULATORY_CARE_PROVIDER_SITE_OTHER): Payer: Medicare Other | Admitting: Internal Medicine

## 2013-07-19 ENCOUNTER — Encounter: Payer: Self-pay | Admitting: Internal Medicine

## 2013-07-19 VITALS — BP 128/60 | HR 55 | Temp 97.6°F | Wt 204.0 lb

## 2013-07-19 DIAGNOSIS — I10 Essential (primary) hypertension: Secondary | ICD-10-CM

## 2013-07-19 DIAGNOSIS — D500 Iron deficiency anemia secondary to blood loss (chronic): Secondary | ICD-10-CM

## 2013-07-19 DIAGNOSIS — I5032 Chronic diastolic (congestive) heart failure: Secondary | ICD-10-CM

## 2013-07-19 DIAGNOSIS — N184 Chronic kidney disease, stage 4 (severe): Secondary | ICD-10-CM

## 2013-07-19 DIAGNOSIS — I509 Heart failure, unspecified: Secondary | ICD-10-CM

## 2013-07-19 DIAGNOSIS — I2510 Atherosclerotic heart disease of native coronary artery without angina pectoris: Secondary | ICD-10-CM

## 2013-07-19 MED ORDER — FERROUS SULFATE 325 (65 FE) MG PO TABS: ORAL_TABLET | ORAL | Status: DC

## 2013-07-19 NOTE — Patient Instructions (Addendum)
Continue current medication. Start iron to help anemia. Call me when you are moved in to [facility]. I need to order lab: Protime, CBC, CMP.

## 2013-07-19 NOTE — Progress Notes (Signed)
Patient ID: Barry Thompson, male   DOB: 1923-11-09, 80 y.o.   MRN: [id number]    Location:    [facility]  Place of Service:   OFFICE    Allergies  Allergen Reactions  . Mold Extract [Trichophyton]     Chief Complaint  Patient presents with  . Medical Managment of Chronic Issues    3 month follow-up   . Medication Management    Discuss Lasix, patient taken 40 mg daily, medication was removed from medication list back in October     HPI:  Laughing, Says he is feeling well.  Anemia due to chronic blood loss : improved  Chronic diastolic congestive heart failure: controlled  Chronic kidney disease, stage IV (severe) - : unchanged  Coronary artery disease: denies angina  HTN (hypertension): controlled    Medications: Patient's Medications  New Prescriptions   FERROUS SULFATE 325 (65 FE) MG TABLET    One daily to treat anemia  Previous Medications   ALPRAZOLAM (XANAX) 0.25 MG TABLET    TAKE 1 TABLET BY MOUTH UP TO 3 TIMES DAILY AS NEEDED FOR NERVES   AMIODARONE (PACERONE) 200 MG TABLET    Take 1 tablet (200 mg total) by mouth daily.   ATORVASTATIN (LIPITOR) 20 MG TABLET    Take 20 mg by mouth daily.   BISACODYL (DULCOLAX) 5 MG EC TABLET    Take 5 mg by mouth daily.   CARVEDILOL (COREG) 25 MG TABLET    Take 2 tablets every 12 hours for blood pressure   CYANOCOBALAMIN 100 MCG TABLET    Take 100 mcg by mouth daily.   FUROSEMIDE (LASIX) 40 MG TABLET    1 by mouth daily   KLOR-CON M20 20 MEQ TABLET    1 by mouth daily   LEVOTHYROXINE (SYNTHROID, LEVOTHROID) 100 MCG TABLET    Take 100 mcg by mouth daily before breakfast.   OMEGA-3 FATTY ACIDS (FISH OIL) 1000 MG CAPS    Take by mouth 2 (two) times daily.   PANTOPRAZOLE (PROTONIX) 40 MG TABLET    TAKE 1 TABLET BY MOUTH EVERY DAY   SPIRONOLACTONE (ALDACTONE) 25 MG TABLET    TAKE 1 TABLET (25 MG TOTAL) BY MOUTH NIGHTLY.   WARFARIN (COUMADIN) 2.5 MG TABLET    Take 1 tablet (2.5 mg total) by mouth as directed.   ZOLPIDEM (AMBIEN) 10 MG  TABLET    TAKE 1 TABLET BY MOUTH AT BEDTIME AS NEEDED FOR SLEEP  Modified Medications   No medications on file  Discontinued Medications   No medications on file     Review of Systems  Constitutional: Positive for fatigue. Negative for activity change, appetite change and unexpected weight change.  HENT: Positive for hearing loss and tinnitus. Negative for congestion and ear pain.   Eyes: Negative.   Respiratory: Positive for shortness of breath. Negative for chest tightness.        History of dyspnea in the past typically with exertion. He feels he is doing better with his.  Cardiovascular: Negative for chest pain, palpitations and leg swelling.       History of aortic valve replacement. History of pacemaker implantation.  Gastrointestinal: Positive for constipation. Negative for abdominal pain and abdominal distention.       Poor appetite.  Endocrine: Negative.   Genitourinary:       History prostate cancer, urinary frequency, and urgency. He is impotent. He has nocturia x2-3. In the daytime he voids every 90 minutes.  Musculoskeletal: Negative.  Negative  for neck pain and neck stiffness.  Skin: Positive for pallor. Negative for rash and wound.  Neurological: Positive for tremors. Negative for syncope, weakness and numbness.       Mild forgetfulness.  Hematological: Negative.   Psychiatric/Behavioral: The patient is nervous/anxious.     Filed Vitals:   07/19/13 1603  BP: 128/60  Pulse: 55  Temp: 97.6 F (36.4 C)  TempSrc: Oral  Weight: 204 lb (92.534 kg)  SpO2: 98%   Physical Exam  Constitutional: He is oriented to person, place, and time. He appears well-developed and well-nourished. No distress.  HENT:  Head: Normocephalic and atraumatic.  Right Ear: External ear normal.  Left Ear: External ear normal.  Nose: Nose normal.  Mouth/Throat: Oropharynx is clear and moist.  Partial deafness bilaterally.  Eyes: Conjunctivae and EOM are normal. Pupils are equal, round, and  reactive to light.  Wears corrective lenses.  Neck: No JVD present. No tracheal deviation present. No thyromegaly present.  Cardiovascular: Normal rate, regular rhythm and intact distal pulses.  Exam reveals no gallop and no friction rub.   2/6 systolic ejection murmur at the base of the heart. Pacemaker left upper anterior chest. Audible systolic click.  Pulmonary/Chest: No respiratory distress. He has no wheezes. He exhibits no tenderness.  Breathing is even and unlabored at rest.  Abdominal: He exhibits no distension and no mass. There is no tenderness.  Musculoskeletal: Normal range of motion. He exhibits no edema and no tenderness.  Lymphadenopathy:    He has no cervical adenopathy.  Neurological: He is alert and oriented to person, place, and time. No cranial nerve deficit. Coordination normal.  Resting tremor bilaterally. Worse on the right side. 01/17/2009 MMSE 25/30. Passed clock drawing.  Skin: No rash noted. No erythema. No pallor.  Multiple seborrheic keratoses. Common Nevus left neck posterior to the ear.  Psychiatric: He has a normal mood and affect. His behavior is normal. Judgment and thought content normal.     Labs reviewed: Abstract on 07/15/2013  Component Date Value Range Status  . HM Colonoscopy 07/15/1999 Dr Rolm Gala,    Final  Anti-coag visit on 07/11/2013  Component Date Value Range Status  . INR 07/11/2013 2.3   Final  Anti-coag visit on 06/16/2013  Component Date Value Range Status  . INR 06/16/2013 2.5   Final  Appointment on 06/06/2013  Component Date Value Range Status  . WBC 06/06/2013 6.4  3.4 - 10.8 x10E3/uL Final  . RBC 06/06/2013 4.37  4.14 - 5.80 x10E6/uL Final  . Hemoglobin 06/06/2013 9.4* 12.6 - 17.7 g/dL Final  . HCT 06/06/2013 31.7* 37.5 - 51.0 % Final  . MCV 06/06/2013 73* 79 - 97 fL Final  . MCH 06/06/2013 21.5* 26.6 - 33.0 pg Final  . MCHC 06/06/2013 29.7* 31.5 - 35.7 g/dL Final  . RDW 06/06/2013 19.5* 12.3 - 15.4 % Final  .  Neutrophils Relative % 06/06/2013 61   Final  . Lymphs 06/06/2013 25   Final  . Monocytes 06/06/2013 11   Final  . Eos 06/06/2013 2   Final  . Basos 06/06/2013 1   Final  . Neutrophils Absolute 06/06/2013 4.0  1.4 - 7.0 x10E3/uL Final  . Lymphocytes Absolute 06/06/2013 1.6  0.7 - 3.1 x10E3/uL Final  . Monocytes Absolute 06/06/2013 0.7  0.1 - 0.9 x10E3/uL Final  . Eosinophils Absolute 06/06/2013 0.1  0.0 - 0.4 x10E3/uL Final  . Basophils Absolute 06/06/2013 0.1  0.0 - 0.2 x10E3/uL Final  . Immature Granulocytes 06/06/2013 0  Final  . Immature Grans (Abs) 06/06/2013 0.0  0.0 - 0.1 x10E3/uL Final  . Glucose 06/06/2013 92  65 - 99 mg/dL Final  . BUN 06/06/2013 28* 8 - 27 mg/dL Final  . Creatinine, Ser 06/06/2013 1.54* 0.76 - 1.27 mg/dL Final  . GFR calc non Af Amer 06/06/2013 40* >59 mL/min/1.73 Final  . GFR calc Af Amer 06/06/2013 46* >59 mL/min/1.73 Final  . BUN/Creatinine Ratio 06/06/2013 18  10 - 22 Final  . Sodium 06/06/2013 139  134 - 144 mmol/L Final  . Potassium 06/06/2013 4.7  3.5 - 5.2 mmol/L Final  . Chloride 06/06/2013 99  97 - 108 mmol/L Final  . CO2 06/06/2013 27  18 - 29 mmol/L Final  . Calcium 06/06/2013 9.3  8.6 - 10.2 mg/dL Final  . Total Protein 06/06/2013 6.5  6.0 - 8.5 g/dL Final  . Albumin 06/06/2013 4.0  3.5 - 4.7 g/dL Final  . Globulin, Total 06/06/2013 2.5  1.5 - 4.5 g/dL Final  . Albumin/Globulin Ratio 06/06/2013 1.6  1.1 - 2.5 Final  . Total Bilirubin 06/06/2013 0.3  0.0 - 1.2 mg/dL Final  . Alkaline Phosphatase 06/06/2013 66  39 - 117 IU/L Final  . AST 06/06/2013 25  0 - 40 IU/L Final  . ALT 06/06/2013 36  0 - 44 IU/L Final  Anti-coag visit on 06/02/2013  Component Date Value Range Status  . INR 06/02/2013 2.6   Final  Anti-coag visit on 05/26/2013  Component Date Value Range Status  . INR 05/26/2013 3.2   Final  Anti-coag visit on 05/19/2013  Component Date Value Range Status  . INR 05/19/2013 3.4   Final  Anti-coag visit on 05/12/2013  Component  Date Value Range Status  . INR 05/12/2013 1.6   Final  Office Visit on 04/26/2013  Component Date Value Range Status  . WBC 04/26/2013 5.1  3.4 - 10.8 x10E3/uL Final  . RBC 04/26/2013 3.41* 4.14 - 5.80 x10E6/uL Final  . Hemoglobin 04/26/2013 8.5* 12.6 - 17.7 g/dL Final  . HCT 04/26/2013 27.9* 37.5 - 51.0 % Final  . MCV 04/26/2013 82  79 - 97 fL Final  . MCH 04/26/2013 24.9* 26.6 - 33.0 pg Final  . MCHC 04/26/2013 30.5* 31.5 - 35.7 g/dL Final  . RDW 04/26/2013 17.0* 12.3 - 15.4 % Final  . Neutrophils Relative % 04/26/2013 64   Final  . Lymphs 04/26/2013 20   Final  . Monocytes 04/26/2013 12   Final  . Eos 04/26/2013 3   Final  . Basos 04/26/2013 1   Final  . Neutrophils Absolute 04/26/2013 3.3  1.4 - 7.0 x10E3/uL Final  . Lymphocytes Absolute 04/26/2013 1.0  0.7 - 3.1 x10E3/uL Final  . Monocytes Absolute 04/26/2013 0.6  0.1 - 0.9 x10E3/uL Final  . Eosinophils Absolute 04/26/2013 0.2  0.0 - 0.4 x10E3/uL Final  . Basophils Absolute 04/26/2013 0.0  0.0 - 0.2 x10E3/uL Final  . Immature Granulocytes 04/26/2013 0   Final  . Immature Grans (Abs) 04/26/2013 0.0  0.0 - 0.1 x10E3/uL Final  . Glucose 04/26/2013 119* 65 - 99 mg/dL Final  . BUN 04/26/2013 22  8 - 27 mg/dL Final  . Creatinine, Ser 04/26/2013 1.65* 0.76 - 1.27 mg/dL Final  . GFR calc non Af Amer 04/26/2013 37* >59 mL/min/1.73 Final  . GFR calc Af Amer 04/26/2013 43* >59 mL/min/1.73 Final  . BUN/Creatinine Ratio 04/26/2013 13  10 - 22 Final  . Sodium 04/26/2013 142  134 - 144 mmol/L Final  .  Potassium 04/26/2013 4.0  3.5 - 5.2 mmol/L Final  . Chloride 04/26/2013 99  97 - 108 mmol/L Final  . CO2 04/26/2013 28  18 - 29 mmol/L Final  . Calcium 04/26/2013 8.8  8.6 - 10.2 mg/dL Final  Admission on 04/14/2013, Discharged on 04/19/2013  Component Date Value Range Status  . Sodium 04/14/2013 136  135 - 145 mEq/L Final  . Potassium 04/14/2013 4.4  3.5 - 5.1 mEq/L Final  . Chloride 04/14/2013 99  96 - 112 mEq/L Final  . CO2 04/14/2013  26  19 - 32 mEq/L Final  . Glucose, Bld 04/14/2013 123* 70 - 99 mg/dL Final  . BUN 04/14/2013 51* 6 - 23 mg/dL Final  . Creatinine, Ser 04/14/2013 2.07* 0.50 - 1.35 mg/dL Final  . Calcium 04/14/2013 9.0  8.4 - 10.5 mg/dL Final  . GFR calc non Af Amer 04/14/2013 27* >90 mL/min Final  . GFR calc Af Amer 04/14/2013 32* >90 mL/min Final   Comment: (NOTE)                          The eGFR has been calculated using the CKD EPI equation.                          This calculation has not been validated in all clinical situations.                          eGFR's persistently <90 mL/min signify possible Chronic Kidney                          Disease.  . WBC 04/14/2013 5.9  4.0 - 10.5 K/uL Final  . RBC 04/14/2013 2.27* 4.22 - 5.81 MIL/uL Final  . Hemoglobin 04/14/2013 6.1* 13.0 - 17.0 g/dL Final   Comment: REPEATED TO VERIFY                          CRITICAL RESULT CALLED TO, READ BACK BY AND VERIFIED WITH:                          J GAGE RN [id number] 04/14/13 A BROWNING  . HCT 04/14/2013 19.5* 39.0 - 52.0 % Final  . MCV 04/14/2013 85.9  78.0 - 100.0 fL Final  . MCH 04/14/2013 26.9  26.0 - 34.0 pg Final  . MCHC 04/14/2013 31.3  30.0 - 36.0 g/dL Final  . RDW 04/14/2013 15.1  11.5 - 15.5 % Final  . Platelets 04/14/2013 223  150 - 400 K/uL Final  . Neutrophils Relative % 04/14/2013 66  43 - 77 % Final  . Neutro Abs 04/14/2013 3.9  1.7 - 7.7 K/uL Final  . Lymphocytes Relative 04/14/2013 19  12 - 46 % Final  . Lymphs Abs 04/14/2013 1.1  0.7 - 4.0 K/uL Final  . Monocytes Relative 04/14/2013 14* 3 - 12 % Final  . Monocytes Absolute 04/14/2013 0.8  0.1 - 1.0 K/uL Final  . Eosinophils Relative 04/14/2013 1  0 - 5 % Final  . Eosinophils Absolute 04/14/2013 0.1  0.0 - 0.7 K/uL Final  . Basophils Relative 04/14/2013 0  0 - 1 % Final  . Basophils Absolute 04/14/2013 0.0  0.0 - 0.1 K/uL Final  . ABO/RH(D) 04/14/2013 O POS   Final  . Antibody  Screen 04/14/2013 NEG   Final  . Sample Expiration 04/14/2013  04/17/2013   Final  . Unit Number 04/14/2013 [id number]   Final  . Blood Component Type 04/14/2013 RED CELLS,LR   Final  . Unit division 04/14/2013 00   Final  . Status of Unit 04/14/2013 ISSUED,FINAL   Final  . Transfusion Status 04/14/2013 OK TO TRANSFUSE   Final  . Crossmatch Result 04/14/2013 Compatible   Final  . Unit Number 04/14/2013 [id number]   Final  . Blood Component Type 04/14/2013 RED CELLS,LR   Final  . Unit division 04/14/2013 00   Final  . Status of Unit 04/14/2013 ISSUED,FINAL   Final  . Transfusion Status 04/14/2013 OK TO TRANSFUSE   Final  . Crossmatch Result 04/14/2013 Compatible   Final  . Prothrombin Time 04/14/2013 23.4* 11.6 - 15.2 seconds Final  . INR 04/14/2013 2.16* 0.00 - 1.49 Final  . Fecal Occult Bld 04/14/2013 POSITIVE* NEGATIVE Final  . Order Confirmation 04/14/2013 ORDER PROCESSED BY BLOOD BANK   Final  . ABO/RH(D) 04/14/2013 O POS   Final  . WBC 04/15/2013 7.9  4.0 - 10.5 K/uL Final  . RBC 04/15/2013 2.81* 4.22 - 5.81 MIL/uL Final  . Hemoglobin 04/15/2013 7.7* 13.0 - 17.0 g/dL Final   POST TRANSFUSION SPECIMEN  . HCT 04/15/2013 24.1* 39.0 - 52.0 % Final  . MCV 04/15/2013 85.8  78.0 - 100.0 fL Final  . MCH 04/15/2013 27.4  26.0 - 34.0 pg Final  . MCHC 04/15/2013 32.0  30.0 - 36.0 g/dL Final  . RDW 04/15/2013 15.4  11.5 - 15.5 % Final  . Platelets 04/15/2013 182  150 - 400 K/uL Final  . Prothrombin Time 04/15/2013 19.4* 11.6 - 15.2 seconds Final  . INR 04/15/2013 1.69* 0.00 - 1.49 Final  . Sodium 04/15/2013 139  135 - 145 mEq/L Final  . Potassium 04/15/2013 4.4  3.5 - 5.1 mEq/L Final  . Chloride 04/15/2013 104  96 - 112 mEq/L Final  . CO2 04/15/2013 26  19 - 32 mEq/L Final  . Glucose, Bld 04/15/2013 90  70 - 99 mg/dL Final  . BUN 04/15/2013 40* 6 - 23 mg/dL Final  . Creatinine, Ser 04/15/2013 1.75* 0.50 - 1.35 mg/dL Final  . Calcium 04/15/2013 8.5  8.4 - 10.5 mg/dL Final  . Total Protein 04/15/2013 5.6* 6.0 - 8.3 g/dL Final  . Albumin  04/15/2013 3.1* 3.5 - 5.2 g/dL Final  . AST 04/15/2013 22  0 - 37 U/L Final  . ALT 04/15/2013 23  0 - 53 U/L Final  . Alkaline Phosphatase 04/15/2013 38* 39 - 117 U/L Final  . Total Bilirubin 04/15/2013 1.2  0.3 - 1.2 mg/dL Final  . GFR calc non Af Amer 04/15/2013 34* >90 mL/min Final  . GFR calc Af Amer 04/15/2013 39* >90 mL/min Final   Comment: (NOTE)                          The eGFR has been calculated using the CKD EPI equation.                          This calculation has not been validated in all clinical situations.                          eGFR's persistently <90 mL/min signify possible Chronic Kidney  Disease.  Marland Kitchen MRSA by PCR 04/14/2013 NEGATIVE  NEGATIVE Final   Comment:                                 The GeneXpert MRSA Assay (FDA                          approved for NASAL specimens                          only), is one component of a                          comprehensive MRSA colonization                          surveillance program. It is not                          intended to diagnose MRSA                          infection nor to guide or                          monitor treatment for                          MRSA infections.  . Hemoglobin 04/15/2013 7.9* 13.0 - 17.0 g/dL Final  . HCT 04/15/2013 24.5* 39.0 - 52.0 % Final  . WBC 04/15/2013 8.9  4.0 - 10.5 K/uL Final  . RBC 04/15/2013 2.88* 4.22 - 5.81 MIL/uL Final  . Hemoglobin 04/15/2013 8.0* 13.0 - 17.0 g/dL Final  . HCT 04/15/2013 24.5* 39.0 - 52.0 % Final  . MCV 04/15/2013 85.1  78.0 - 100.0 fL Final  . MCH 04/15/2013 27.8  26.0 - 34.0 pg Final  . MCHC 04/15/2013 32.7  30.0 - 36.0 g/dL Final  . RDW 04/15/2013 15.3  11.5 - 15.5 % Final  . Platelets 04/15/2013 174  150 - 400 K/uL Final  . WBC 04/16/2013 7.9  4.0 - 10.5 K/uL Final  . RBC 04/16/2013 2.81* 4.22 - 5.81 MIL/uL Final  . Hemoglobin 04/16/2013 7.7* 13.0 - 17.0 g/dL Final  . HCT 04/16/2013 23.9* 39.0 - 52.0 % Final  . MCV  04/16/2013 85.1  78.0 - 100.0 fL Final  . MCH 04/16/2013 27.4  26.0 - 34.0 pg Final  . MCHC 04/16/2013 32.2  30.0 - 36.0 g/dL Final  . RDW 04/16/2013 15.3  11.5 - 15.5 % Final  . Platelets 04/16/2013 180  150 - 400 K/uL Final  . Hemoglobin 04/17/2013 7.4* 13.0 - 17.0 g/dL Final  . HCT 04/17/2013 23.1* 39.0 - 52.0 % Final  . Sodium 04/17/2013 138  135 - 145 mEq/L Final  . Potassium 04/17/2013 4.4  3.5 - 5.1 mEq/L Final  . Chloride 04/17/2013 104  96 - 112 mEq/L Final  . CO2 04/17/2013 28  19 - 32 mEq/L Final  . Glucose, Bld 04/17/2013 91  70 - 99 mg/dL Final  . BUN 04/17/2013 21  6 - 23 mg/dL Final  . Creatinine, Ser 04/17/2013 1.38* 0.50 - 1.35 mg/dL Final  . Calcium 04/17/2013 8.4  8.4 - 10.5 mg/dL Final  . GFR calc non Af Amer 04/17/2013 44* >90 mL/min Final  . GFR calc Af Amer 04/17/2013 51* >90 mL/min Final   Comment: (NOTE)                          The eGFR has been calculated using the CKD EPI equation.                          This calculation has not been validated in all clinical situations.                          eGFR's persistently <90 mL/min signify possible Chronic Kidney                          Disease.  Marland Kitchen Hemoglobin 04/18/2013 7.6* 13.0 - 17.0 g/dL Final  . HCT 04/18/2013 23.7* 39.0 - 52.0 % Final  . WBC 04/19/2013 4.9  4.0 - 10.5 K/uL Final  . RBC 04/19/2013 2.99* 4.22 - 5.81 MIL/uL Final  . Hemoglobin 04/19/2013 7.9* 13.0 - 17.0 g/dL Final  . HCT 04/19/2013 25.1* 39.0 - 52.0 % Final  . MCV 04/19/2013 83.9  78.0 - 100.0 fL Final  . MCH 04/19/2013 26.4  26.0 - 34.0 pg Final  . MCHC 04/19/2013 31.5  30.0 - 36.0 g/dL Final  . RDW 04/19/2013 14.6  11.5 - 15.5 % Final  . Platelets 04/19/2013 161  150 - 400 K/uL Final  . Sodium 04/19/2013 137  135 - 145 mEq/L Final  . Potassium 04/19/2013 4.5  3.5 - 5.1 mEq/L Final  . Chloride 04/19/2013 103  96 - 112 mEq/L Final  . CO2 04/19/2013 27  19 - 32 mEq/L Final  . Glucose, Bld 04/19/2013 100* 70 - 99 mg/dL Final  . BUN  04/19/2013 18  6 - 23 mg/dL Final  . Creatinine, Ser 04/19/2013 1.41* 0.50 - 1.35 mg/dL Final  . Calcium 04/19/2013 8.5  8.4 - 10.5 mg/dL Final  . GFR calc non Af Amer 04/19/2013 43* >90 mL/min Final  . GFR calc Af Amer 04/19/2013 50* >90 mL/min Final   Comment: (NOTE)                          The eGFR has been calculated using the CKD EPI equation.                          This calculation has not been validated in all clinical situations.                          eGFR's persistently <90 mL/min signify possible Chronic Kidney                          Disease.  There may be more visits with results that are not included.    Assessment/Plan  1. Anemia due to chronic blood loss improving - ferrous sulfate 325 (65 FE) MG tablet; One daily to treat anemia  Dispense: 100 tablet; Refill: 3 - CBC With differential/Platelet; Future  2. Chronic diastolic congestive heart failure improved  3. Chronic kidney disease, stage IV (severe) Improved BUN and Creatinine - CMP; Future  4. Coronary artery disease asymptomatic  5. HTN (hypertension) controlled

## 2013-07-20 ENCOUNTER — Other Ambulatory Visit: Payer: Self-pay | Admitting: Internal Medicine

## 2013-08-08 ENCOUNTER — Other Ambulatory Visit: Payer: Self-pay | Admitting: Internal Medicine

## 2013-08-08 ENCOUNTER — Ambulatory Visit (INDEPENDENT_AMBULATORY_CARE_PROVIDER_SITE_OTHER): Payer: Medicare Other

## 2013-08-08 ENCOUNTER — Other Ambulatory Visit: Payer: Self-pay | Admitting: Nurse Practitioner

## 2013-08-08 DIAGNOSIS — Z954 Presence of other heart-valve replacement: Secondary | ICD-10-CM

## 2013-08-08 DIAGNOSIS — I4891 Unspecified atrial fibrillation: Secondary | ICD-10-CM

## 2013-08-08 DIAGNOSIS — Z952 Presence of prosthetic heart valve: Secondary | ICD-10-CM

## 2013-08-08 DIAGNOSIS — Z5181 Encounter for therapeutic drug level monitoring: Secondary | ICD-10-CM | POA: Insufficient documentation

## 2013-08-08 LAB — POCT INR: INR: 3.4

## 2013-08-17 ENCOUNTER — Other Ambulatory Visit: Payer: Self-pay | Admitting: Cardiovascular Disease

## 2013-08-17 ENCOUNTER — Other Ambulatory Visit: Payer: Self-pay | Admitting: Internal Medicine

## 2013-08-23 ENCOUNTER — Other Ambulatory Visit: Payer: Self-pay | Admitting: Internal Medicine

## 2013-09-12 ENCOUNTER — Telehealth: Payer: Self-pay | Admitting: *Deleted

## 2013-09-12 NOTE — Telephone Encounter (Signed)
Patient wife called and stated that they have moved into [facility] and needs orders for Coumadin to be drawn. She stated that the cardiologist will no longer be following because they have moved into the facility. Gave Orders to Tywan at [facility] and she stated that she was filling in for [facility] while she is out on Maternity leave. She stated that they will do the Protime tomorrow and fax to [place]. Patient wife Notified.

## 2013-09-13 ENCOUNTER — Telehealth: Payer: Self-pay | Admitting: *Deleted

## 2013-09-13 NOTE — Telephone Encounter (Signed)
Protime Results from 09/13/2013 [facility]  INR:  1.9  Current Dose of Coumadin 2.5mg  daily except 5mg  on Wednesday Per Dr. Nyoka Cowden: Change to 5mg  Wed and Sat, 2.5mg  all other days and recheck 2 weeks  Patient Notified and faxed to [facility] [phone]

## 2013-09-26 ENCOUNTER — Telehealth: Payer: Self-pay | Admitting: *Deleted

## 2013-09-26 ENCOUNTER — Ambulatory Visit: Payer: Self-pay | Admitting: Pharmacist

## 2013-09-26 DIAGNOSIS — Z952 Presence of prosthetic heart valve: Secondary | ICD-10-CM

## 2013-09-26 DIAGNOSIS — I4891 Unspecified atrial fibrillation: Secondary | ICD-10-CM

## 2013-09-26 DIAGNOSIS — Z5181 Encounter for therapeutic drug level monitoring: Secondary | ICD-10-CM

## 2013-09-26 NOTE — Telephone Encounter (Signed)
Protime Results from [facility] 09/26/2013  INR:  1.9  Current Dose of Coumadin 2.5mg  daily except 5mg  on Wed and Sat. Per Dr. Reed--Continue 2.5mg  daily except 5mg  on Wed and Saturday and recheck in 2 weeks  Patient wife Notified and Faxed new Orders to [facility] at [phone]

## 2013-10-01 ENCOUNTER — Other Ambulatory Visit: Payer: Self-pay | Admitting: Internal Medicine

## 2013-10-13 ENCOUNTER — Other Ambulatory Visit: Payer: Self-pay | Admitting: Internal Medicine

## 2013-10-26 ENCOUNTER — Other Ambulatory Visit: Payer: Self-pay | Admitting: Otolaryngology

## 2013-10-26 DIAGNOSIS — H918X9 Other specified hearing loss, unspecified ear: Secondary | ICD-10-CM

## 2013-10-27 ENCOUNTER — Non-Acute Institutional Stay: Payer: Medicare Other | Admitting: Internal Medicine

## 2013-10-27 ENCOUNTER — Encounter: Payer: Self-pay | Admitting: Internal Medicine

## 2013-10-27 ENCOUNTER — Other Ambulatory Visit: Payer: Self-pay | Admitting: Otolaryngology

## 2013-10-27 VITALS — BP 152/72 | HR 60 | Wt 208.0 lb

## 2013-10-27 DIAGNOSIS — D500 Iron deficiency anemia secondary to blood loss (chronic): Secondary | ICD-10-CM

## 2013-10-27 DIAGNOSIS — R413 Other amnesia: Secondary | ICD-10-CM

## 2013-10-27 DIAGNOSIS — R259 Unspecified abnormal involuntary movements: Secondary | ICD-10-CM

## 2013-10-27 DIAGNOSIS — H903 Sensorineural hearing loss, bilateral: Secondary | ICD-10-CM

## 2013-10-27 DIAGNOSIS — I5032 Chronic diastolic (congestive) heart failure: Secondary | ICD-10-CM

## 2013-10-27 DIAGNOSIS — H905 Unspecified sensorineural hearing loss: Secondary | ICD-10-CM

## 2013-10-27 DIAGNOSIS — R251 Tremor, unspecified: Secondary | ICD-10-CM

## 2013-10-27 DIAGNOSIS — I10 Essential (primary) hypertension: Secondary | ICD-10-CM

## 2013-10-27 DIAGNOSIS — I4891 Unspecified atrial fibrillation: Secondary | ICD-10-CM

## 2013-10-27 DIAGNOSIS — E782 Mixed hyperlipidemia: Secondary | ICD-10-CM

## 2013-10-27 DIAGNOSIS — N184 Chronic kidney disease, stage 4 (severe): Secondary | ICD-10-CM

## 2013-10-27 DIAGNOSIS — H9190 Unspecified hearing loss, unspecified ear: Secondary | ICD-10-CM

## 2013-10-27 DIAGNOSIS — I2510 Atherosclerotic heart disease of native coronary artery without angina pectoris: Secondary | ICD-10-CM

## 2013-10-27 DIAGNOSIS — I509 Heart failure, unspecified: Secondary | ICD-10-CM

## 2013-10-27 NOTE — Progress Notes (Signed)
Patient ID: Barry Thompson, male   DOB: 1924-03-09, 80 y.o.   MRN: [id number]    Location:  [facility]  Place of Service: CLINIC    Allergies  Allergen Reactions  . Mold Extract [Trichophyton]     Chief Complaint  Patient presents with  . Medical Managment of Chronic Issues    blood pressure, CKD, CHF, anemia  . Back Pain    off and on for couple of months.  Back heats up at night in bed    HPI:   Has noted hot areas of the back at the level of the scapula and more prominently at the right scapula. Comes and goes. Worse at night.  Pain in the right shoulder when he turns on it in bed. Rotational movements are uncomfortable.  Wife says he has become more forgetful and easily confused. Also, seems to have a personality change. Frustrated easily, sometimes depressed. Went to get a massage and this is out of character for him. Does not always tell her when he will be gone.  HTN (hypertension): mild elevations in SBP  Chronic diastolic congestive heart failure: compensated  Atrial fibrillation : currently seems to be in NSR.  Coronary artery disease; no chest pains  Chronic kidney disease, stage IV (severe): unchanged  Anemia due to chronic blood loss: improved  Mixed hyperlipidemia: controlled    Medications: Patient's Medications  New Prescriptions   No medications on file  Previous Medications   ALPRAZOLAM (XANAX) 0.25 MG TABLET    TAKE 1 TABLET BY MOUTH UP TO 3 TIMES DAILY AS NEEDED FOR NERVES   AMIODARONE (PACERONE) 200 MG TABLET    Take 1 tablet (200 mg total) by mouth daily.   ATORVASTATIN (LIPITOR) 20 MG TABLET    TAKE 1 TABLET BY MOUTH EVERY DAY   BISACODYL (DULCOLAX) 5 MG EC TABLET    Take 5 mg by mouth daily.   CARVEDILOL (COREG) 25 MG TABLET    Take 2 tablets every 12 hours for blood pressure   CYANOCOBALAMIN 100 MCG TABLET    Take 100 mcg by mouth daily.   FERROUS SULFATE 325 (65 FE) MG TABLET    One daily to treat anemia   FUROSEMIDE (LASIX) 40 MG TABLET     TAKE 1 TABLET DAILY TO CONTROL EDEMA   KLOR-CON M20 20 MEQ TABLET    TAKE 1 TABLET BY MOUTH DAILY.   LEVOTHYROXINE (SYNTHROID, LEVOTHROID) 100 MCG TABLET    TAKE 1 TABLET EVERY DAY FOR THYROID SUPPLEMENT   OMEGA-3 FATTY ACIDS (FISH OIL) 1000 MG CAPS    Take by mouth 2 (two) times daily.   PANTOPRAZOLE (PROTONIX) 40 MG TABLET    TAKE 1 TABLET BY MOUTH EVERY DAY   SPIRONOLACTONE (ALDACTONE) 25 MG TABLET    TAKE 1 TABLET (25 MG TOTAL) BY MOUTH NIGHTLY.   WARFARIN (COUMADIN) 2.5 MG TABLET    Take as directed by coumadin clinic   ZOLPIDEM (AMBIEN) 10 MG TABLET    TAKE 1 TABLET AT BEDTIME AS NEEDED FOR SLEEP  Modified Medications   No medications on file  Discontinued Medications   No medications on file     Review of Systems  Constitutional: Positive for fatigue. Negative for activity change, appetite change and unexpected weight change.  HENT: Positive for hearing loss and tinnitus. Negative for congestion and ear pain.   Eyes: Negative.   Respiratory: Positive for shortness of breath. Negative for chest tightness.        History of  dyspnea in the past typically with exertion. He feels he is doing better with his.  Cardiovascular: Negative for chest pain, palpitations and leg swelling.       History of aortic valve replacement. History of pacemaker implantation.  Gastrointestinal: Positive for constipation. Negative for abdominal pain and abdominal distention.       Poor appetite.  Endocrine: Negative.   Genitourinary:       History prostate cancer, urinary frequency, and urgency. He is impotent. He has nocturia x2-3. In the daytime he voids every 90 minutes.  Musculoskeletal: Negative.  Negative for neck pain and neck stiffness.  Skin: Positive for pallor. Negative for rash and wound.  Neurological: Positive for tremors. Negative for syncope, weakness and numbness.       Mild forgetfulness.  Hematological: Negative.   Psychiatric/Behavioral: The patient is nervous/anxious.     Filed  Vitals:   10/27/13 1538  BP: 152/72  Pulse: 60  Weight: 208 lb (94.348 kg)   Physical Exam  Constitutional: He is oriented to person, place, and time. He appears well-developed and well-nourished. No distress.  HENT:  Head: Normocephalic and atraumatic.  Right Ear: External ear normal.  Left Ear: External ear normal.  Nose: Nose normal.  Mouth/Throat: Oropharynx is clear and moist.  Partial deafness bilaterally.  Eyes: Conjunctivae and EOM are normal. Pupils are equal, round, and reactive to light.  Wears corrective lenses.  Neck: No JVD present. No tracheal deviation present. No thyromegaly present.  Cardiovascular: Normal rate, regular rhythm and intact distal pulses.  Exam reveals no gallop and no friction rub.   2/6 systolic ejection murmur at the base of the heart. Pacemaker left upper anterior chest. Audible systolic click.  Pulmonary/Chest: No respiratory distress. He has no wheezes. He exhibits no tenderness.  Breathing is even and unlabored at rest.  Abdominal: He exhibits no distension and no mass. There is no tenderness.  Musculoskeletal: Normal range of motion. He exhibits no edema and no tenderness.  Lymphadenopathy:    He has no cervical adenopathy.  Neurological: He is alert and oriented to person, place, and time. No cranial nerve deficit. Coordination normal.  Resting tremor bilaterally. Worse on the right side. 01/17/2009 MMSE 25/30. Passed clock drawing.  Skin: No rash noted. No erythema. No pallor.  Multiple seborrheic keratoses. Common Nevus left neck posterior to the ear.  Psychiatric: He has a normal mood and affect. His behavior is normal. Judgment and thought content normal.     Labs reviewed: Anti-coag visit on 08/08/2013  Component Date Value Ref Range Status  . INR 08/08/2013 3.4   Final  Abstract on 07/15/2013  Component Date Value Ref Range Status  . [facility] Colonoscopy 07/15/1999 Dr Rolm Gala,    Final  Anti-coag visit on 07/11/2013  Component Date  Value Ref Range Status  . INR 07/11/2013 2.3   Final  Anti-coag visit on 06/16/2013  Component Date Value Ref Range Status  . INR 06/16/2013 2.5   Final  Appointment on 06/06/2013  Component Date Value Ref Range Status  . WBC 06/06/2013 6.4  3.4 - 10.8 x10E3/uL Final  . RBC 06/06/2013 4.37  4.14 - 5.80 x10E6/uL Final  . Hemoglobin 06/06/2013 9.4* 12.6 - 17.7 g/dL Final  . HCT 06/06/2013 31.7* 37.5 - 51.0 % Final  . MCV 06/06/2013 73* 79 - 97 fL Final  . MCH 06/06/2013 21.5* 26.6 - 33.0 pg Final  . MCHC 06/06/2013 29.7* 31.5 - 35.7 g/dL Final  . RDW 06/06/2013 19.5* 12.3 - 15.4 %  Final  . Neutrophils Relative % 06/06/2013 61   Final  . Lymphs 06/06/2013 25   Final  . Monocytes 06/06/2013 11   Final  . Eos 06/06/2013 2   Final  . Basos 06/06/2013 1   Final  . Neutrophils Absolute 06/06/2013 4.0  1.4 - 7.0 x10E3/uL Final  . Lymphocytes Absolute 06/06/2013 1.6  0.7 - 3.1 x10E3/uL Final  . Monocytes Absolute 06/06/2013 0.7  0.1 - 0.9 x10E3/uL Final  . Eosinophils Absolute 06/06/2013 0.1  0.0 - 0.4 x10E3/uL Final  . Basophils Absolute 06/06/2013 0.1  0.0 - 0.2 x10E3/uL Final  . Immature Granulocytes 06/06/2013 0   Final  . Immature Grans (Abs) 06/06/2013 0.0  0.0 - 0.1 x10E3/uL Final  . Glucose 06/06/2013 92  65 - 99 mg/dL Final  . BUN 06/06/2013 28* 8 - 27 mg/dL Final  . Creatinine, Ser 06/06/2013 1.54* 0.76 - 1.27 mg/dL Final  . GFR calc non Af Amer 06/06/2013 40* >59 mL/min/1.73 Final  . GFR calc Af Amer 06/06/2013 46* >59 mL/min/1.73 Final  . BUN/Creatinine Ratio 06/06/2013 18  10 - 22 Final  . Sodium 06/06/2013 139  134 - 144 mmol/L Final  . Potassium 06/06/2013 4.7  3.5 - 5.2 mmol/L Final  . Chloride 06/06/2013 99  97 - 108 mmol/L Final  . CO2 06/06/2013 27  18 - 29 mmol/L Final  . Calcium 06/06/2013 9.3  8.6 - 10.2 mg/dL Final  . Total Protein 06/06/2013 6.5  6.0 - 8.5 g/dL Final  . Albumin 06/06/2013 4.0  3.5 - 4.7 g/dL Final  . Globulin, Total 06/06/2013 2.5  1.5 - 4.5 g/dL  Final  . Albumin/Globulin Ratio 06/06/2013 1.6  1.1 - 2.5 Final  . Total Bilirubin 06/06/2013 0.3  0.0 - 1.2 mg/dL Final  . Alkaline Phosphatase 06/06/2013 66  39 - 117 IU/L Final  . AST 06/06/2013 25  0 - 40 IU/L Final  . ALT 06/06/2013 36  0 - 44 IU/L Final  Anti-coag visit on 06/02/2013  Component Date Value Ref Range Status  . INR 06/02/2013 2.6   Final  Anti-coag visit on 05/26/2013  Component Date Value Ref Range Status  . INR 05/26/2013 3.2   Final  Anti-coag visit on 05/19/2013  Component Date Value Ref Range Status  . INR 05/19/2013 3.4   Final  Anti-coag visit on 05/12/2013  Component Date Value Ref Range Status  . INR 05/12/2013 1.6   Final       Assessment/Plan 1. HTN (hypertension) Mild elevation in SBP. Continue current medications.  2. Chronic diastolic congestive heart failure compensated  3. Atrial fibrillation   Currently in NSR  4. Coronary artery disease Stable. No angina.  5. Chronic kidney disease, stage IV (severe) unchanged  6. Anemia due to chronic blood loss needs followup lab  7. Mixed hyperlipidemia Needs follow up lab  8. Deaf Interferes with communication. He has seen Dr. Elwyn Reach and will continue with him.  9. Tremor unchanged  10. Memory loss Progressing.

## 2013-10-29 ENCOUNTER — Encounter: Payer: Self-pay | Admitting: Internal Medicine

## 2013-10-29 DIAGNOSIS — R413 Other amnesia: Secondary | ICD-10-CM

## 2013-10-29 DIAGNOSIS — H9190 Unspecified hearing loss, unspecified ear: Secondary | ICD-10-CM | POA: Insufficient documentation

## 2013-10-29 DIAGNOSIS — R251 Tremor, unspecified: Secondary | ICD-10-CM

## 2013-10-29 HISTORY — DX: Tremor, unspecified: R25.1

## 2013-10-29 HISTORY — DX: Other amnesia: R41.3

## 2013-11-01 ENCOUNTER — Inpatient Hospital Stay: Admission: RE | Admit: 2013-11-01 | Payer: Medicare Other | Source: Ambulatory Visit

## 2013-11-02 ENCOUNTER — Other Ambulatory Visit: Payer: Self-pay | Admitting: Nurse Practitioner

## 2013-11-03 ENCOUNTER — Other Ambulatory Visit: Payer: Self-pay | Admitting: Otolaryngology

## 2013-11-03 ENCOUNTER — Ambulatory Visit
Admission: RE | Admit: 2013-11-03 | Discharge: 2013-11-03 | Disposition: A | Discharge status: Home / Self Care | Payer: Medicare Other | Source: Ambulatory Visit | Attending: Otolaryngology | Admitting: Otolaryngology

## 2013-11-03 DIAGNOSIS — H905 Unspecified sensorineural hearing loss: Secondary | ICD-10-CM

## 2013-11-03 DIAGNOSIS — H903 Sensorineural hearing loss, bilateral: Secondary | ICD-10-CM

## 2013-11-07 ENCOUNTER — Telehealth: Payer: Self-pay | Admitting: *Deleted

## 2013-11-07 ENCOUNTER — Other Ambulatory Visit: Payer: Self-pay | Admitting: Nurse Practitioner

## 2013-11-07 NOTE — Telephone Encounter (Signed)
Protime Results from [facility] 11/07/2013  INR:  2.5  Current Dose of Coumadin: 2.5mg  daily except 5mg  on Wed and Sat Per Dr. Mariea Clonts:  Continue same dose of Coumadin and Recheck in 1 month  Tried calling patient and it states Voicemail is not setup and cannot leave message. Called and left message at [facility] with Nurse Arlyce Dice and faxed results to her.

## 2013-11-23 ENCOUNTER — Other Ambulatory Visit: Payer: Self-pay | Admitting: Nurse Practitioner

## 2013-11-26 ENCOUNTER — Other Ambulatory Visit: Payer: Self-pay | Admitting: Nurse Practitioner

## 2013-11-29 ENCOUNTER — Other Ambulatory Visit: Payer: Self-pay | Admitting: Internal Medicine

## 2013-12-06 ENCOUNTER — Telehealth: Payer: Self-pay | Admitting: *Deleted

## 2013-12-06 NOTE — Telephone Encounter (Signed)
Protime Results from 12/06/2013 from [facility]  INR:  1.9  Current Dose of Coumadin is 2.5mg  daily except 5mg  on Wed and Saturday Per Dr. Caesar Chestnut 2.5mg  daily everyday and recheck protime in 1 week Patient Notified and faxed results back to [facility]

## 2013-12-13 ENCOUNTER — Telehealth: Payer: Self-pay | Admitting: *Deleted

## 2013-12-13 NOTE — Telephone Encounter (Signed)
Received Protime Results from [facility] 12/13/2013  INR:   1.9  Current Dose of Coumadin is 2.5mg  daily. Per Dr. Vicente Males 2.5mg  once daily and recheck in 2 weeks  Tried calling patient several times and patient voicemail is not set up so it cannot accept calls. Called [facility] and left message for clinic nurse with the directions and to call back to confirm that she received the message. Faxed results to Clinic

## 2013-12-16 ENCOUNTER — Encounter: Payer: Self-pay | Admitting: Cardiology

## 2013-12-19 ENCOUNTER — Telehealth: Payer: Self-pay | Admitting: Internal Medicine

## 2013-12-19 NOTE — Telephone Encounter (Signed)
12-19-13 certified letter sent regarding pt's past due device check/mt ° °

## 2013-12-26 ENCOUNTER — Other Ambulatory Visit: Payer: Self-pay | Admitting: Internal Medicine

## 2013-12-27 ENCOUNTER — Telehealth: Payer: Self-pay | Admitting: *Deleted

## 2013-12-27 IMAGING — CT CT ANGIO AOBIFEM WO/W CM
1 of 6 series · 5 of 16 positions shown, 7 images · IV contrast (omnipaque)
Comparison: 07/17/2036, 11/12/2011

CLINICAL DATA: Aortic valvular disease, preoperative evaluation,
atrial fibrillation

CT ANGIOGRAPHY OF ABDOMINAL AORTA WITH ILIOFEMORAL RUNOFF
TECHNIQUE: Multidetector CT imaging of the abdomen, pelvis and
lower extremities was performed using the standard protocol during
bolus administration of intravenous contrast.  Multiplanar CT image
reconstructions including MIPs were obtained to evaluate the
vascular anatomy.
Contrast: 70mL OMNIPAQUE IOHEXOL 350 MG/ML SOLN

[Series 7: soft tissue · axial · 0.82mm/px · z∈[-845,-193]mm · 5 of 490 slices shown, 7 images]
[im 82/490  soft-tissue]
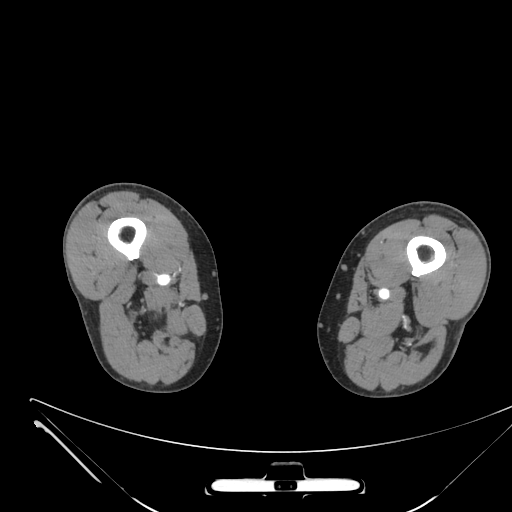
[im 82/490  bone]
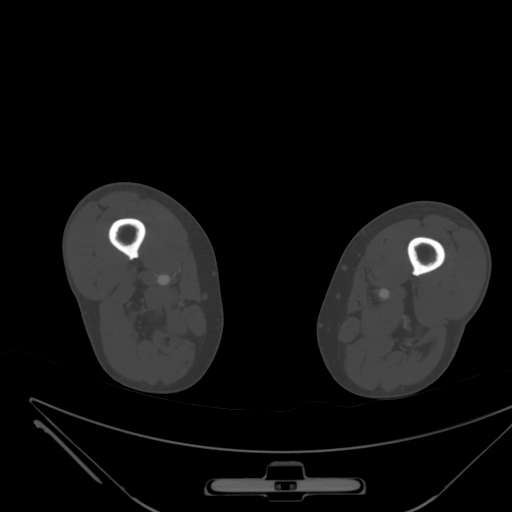
[im 164/490  soft-tissue]
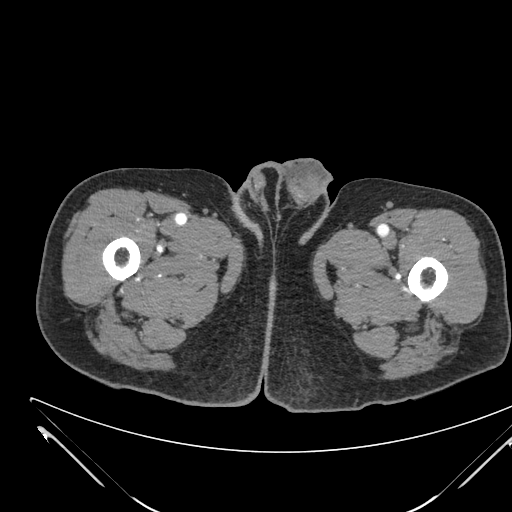
[im 245/490  soft-tissue]
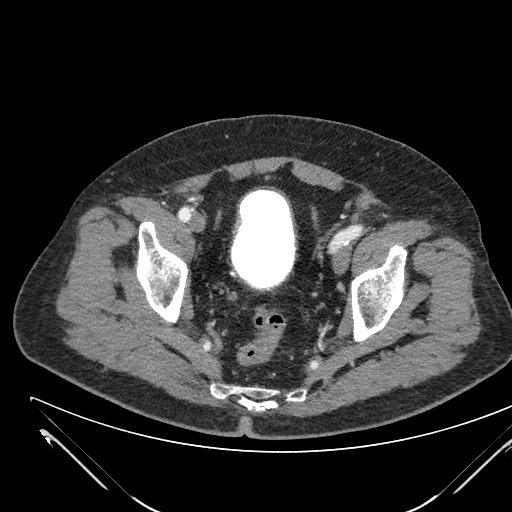
[im 327/490  soft-tissue]
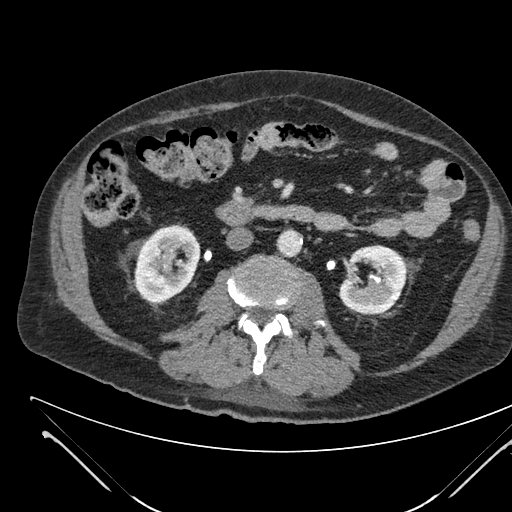
[im 408/490  soft-tissue]
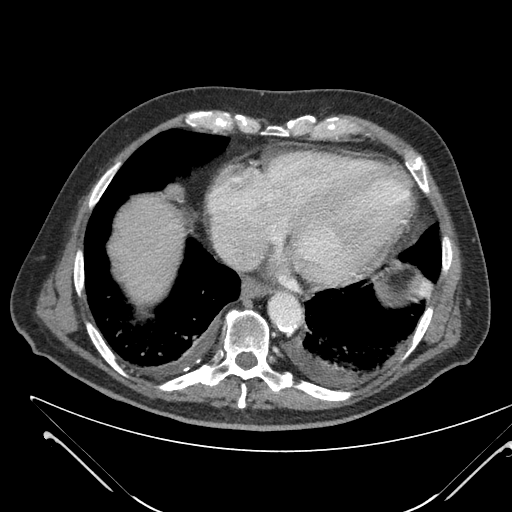
[im 408/490  bone]
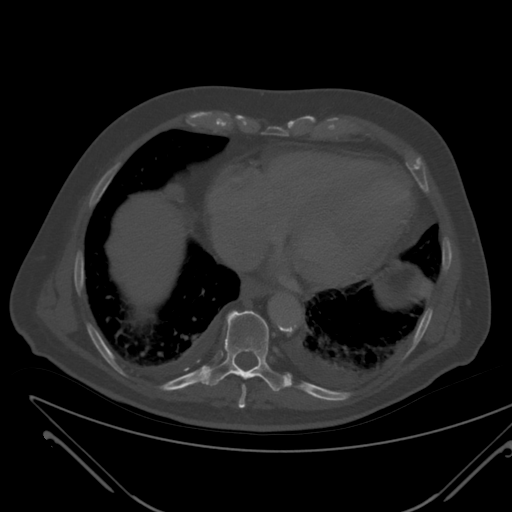

[5 of 16 positions shown; findings below may reference images not displayed]

FINDINGS: Aorta:  Tortuous brachiocephalic, left common carotid, left
subclavian arteries without evidence of occlusive process.  Heavy
calcification of the transverse aorta and descending aorta.
Negative for thoracic aneurysm, dissection, occlusive process.

Scattered calcific atherosclerosis of the abdominal aorta.
Negative for abdominal aortic aneurysm, occlusion, dissection or
occlusive process.  Abdominal aorta is slightly tortuous to the
right.

Celiac origin narrowing noted, degree of stenosis difficult to
assess because of tortuosity.  SMA has a calcified origin but
appears patent with minor narrowing.  Renal origins appear
calcified but patent.  IMA is also patent.

Right Lower Extremity:  Tortuous right common, internal and
external iliac arteries.  No iliac significant vascular process.
Common femoral, profunda femoral, and SFA are all patent.  Negative
for SFA occlusion.  Diffuse minor atherosclerosis.  Visualized
popliteal artery patent.

Left Lower Extremity:  Tortuous left iliac system but the common,
internal and external iliac arteries are patent.  No iliac
occlusive process.  Left common femoral, profunda femoral, and
superficial femoral arteries are patent.  Visualized popliteal
artery patent.

Nonvascular imaging:  Resolved pericardial effusion.  Trace
residual bilateral pleural effusions dependently.  No adenopathy in
the chest.  No hiatal hernia.  Basilar calcified pleural plaques
bilaterally.  Lung windows demonstrate motion artifact diffusely.
Scattered minor dependent atelectasis.

The liver, gallbladder, biliary system, pancreas, spleen, adrenal
glands, and kidneys are within normal limits for age and
demonstrate no acute process.  Tiny incidental calcified gallstone
image 130.

Negative for abdominal free fluid, fluid collection, hemorrhage,
hematoma.  No bowel obstruction or dilatation.  Scattered colonic
diverticulosis.  No pelvic free fluid or fluid collection.
Prostate radiation implant seeds present.  No inguinal abnormality
or significant hernia.

Degenerative changes of the spine diffusely.

 Review of the MIP images confirms the above findings.
IMPRESSION: Diffuse calcific atherosclerosis.  Negative for thoracic or
abdominal aortic aneurysm or dissection.

Patent pelvic iliac vasculature, as well as patent peripheral
vasculature.

Negative for aorto iliac occlusive process or significant
SFA/popliteal vascular disease.

Resolved pericardial effusion and smaller pleural effusions

Calcified pleural plaques bilaterally

Incidental tiny gallstone

Colonic diverticulosis

## 2013-12-27 IMAGING — CT CT HEART MORPH/PULM VEIN W/ CM & W/O CA SCORE
1 series · 1 of 1 positions shown, 2 images · IV contrast (CONTRAST)
Comparison: CT chest 07/18/2011.

OVER-READ INTERPRETATION - CT CHEST

The following report is an over-read performed by radiologist Dr.
Mille No, M.D. [REDACTED] on 11/13/2011
[DATE].  This over-read does not include interpretation of
cardiac or coronary anatomy or pathology.  The CTA interpretation
by the cardiologist is attached.
INDICATION: Assess Aortic Valve
PROTOCOL: The patient was scanned on a Philips 256 scanner.
Gantry rotation speed was 270 msec and collimation.9mm.  No beta
blocker or nitro were given.  Average HR during scan was 56 bpm.  A
retrospective scan was done to look at valve motion and assess EF.
The patient received 80cc of contrast.  A nongated run-off study of
the abdominal and pelvic vasculature was done with 70c of contrast
following the cardiac scan.  The 3D data set was sent to a [employer]ips
work station for analysis using MPR, MIP and VRT modes

[Series 1401: area · oblique · 0.40mm/px · 1 of 1 slices shown, 2 images]
[im 1/1  vessel]
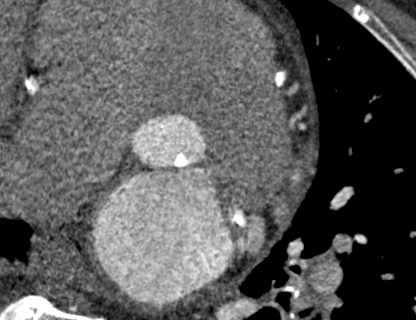
[im 1/1  lung]
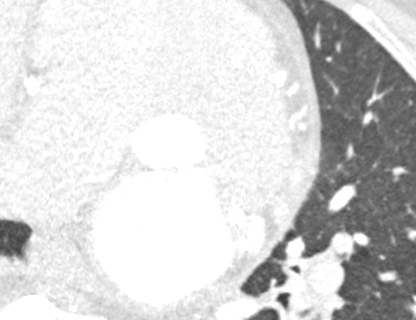

[1 of 1 positions shown; findings below may reference images not displayed]

FINDINGS: No pathologically enlarged lymph nodes in the visualized
portion of the mediastinum and hilar regions.  Pericardial effusion
has resolved in the interval.  Small bilateral pleural effusions,
decreased in size.  Calcified pleural plaques are seen bilaterally.
Incomplete resolution of left lower lobe air space disease.
Visualized portion of the upper abdomen is unremarkable.  No
worrisome lytic or sclerotic lesions in the visualized osseous
structures.
IMPRESSION: 1.  Interval resolution of pericardial effusion.
2.  Small bilateral pleural effusions with incompletely resolved
left lower lobe air space disease.
3.  Asbestos related pleural disease.

Cardiac CT:
Aortic Valve:  The valve was trileaflet with severe limited
systolic motion.  There was heavy calcification of the left
coronary cusp and commisure between the right and left cusps.

Ascending Aorta:  No significant dilatation or aneurysm.  Measures
3.3 cm at RPA

Sinus of Valsalva: Basal short axis diameters:

Left sinus: 35.2mm
Right sinus: 32.7mm
Noncoronary: 33.5 mm

Left main coronary artery was 16.4 mm above valve plane
Right coronary ostia was 17.5 mm above valve plane

Virtual Basal Annulus:

Area: 323.5cm4- calculated diameter 25.2mm
Perimeter: 79.9mm- calculated diameter 27.8 mm
Min/Max: 29mm long axis, 21.4 short axis average: 25.1 mm

Cornary Arteries:  Suboptimal study.  Moderate calcification of
proximal RAC, circumflex and LAD.  Moderate calcification of mid
LAD.  Distal vessels and branch vessels not well seen.  No high
grade stenosis seen in the LM or proximal vessels

LV:  Normal size and function.  EF 62% ( EDV 892cc, ESV 84cc, SV
870cc) No discrete RWMA.

Peripheral Vessels:  See separate report from Dr Asefu [HOSPITAL]

There is no aneurysm and no circumferential calcification.  Mild
calcification of the anterior wall of the common femoral ateries.
The right iliac is markedly tortuous with two greater than 90
degree bens.  The left iliac has mild tortuosity The minimal
luminal diameter of the right common femoral and iliac system is
10.4 mm and the minimal luminal diameter of the left system is
mm
IMPRESSION: Patient has severe AS by echo criteria.  Valve is trileaflet and
moderately calcified overall.  There is no ascending aortic
aneurysm with root diameter of 33mm.  The average virtual basal
diameter is in the 25-27 mm range indicated [REDACTED] Corevalve
of 29mm would be an appropriate size.  There is no critical
proximal and mid CAD.  The EF is normal at 62%.  The common femoral
arteries and iliacs have minimal luminal diameters above 10mm but
the right iliac system is probably too tortuous for a TAVR
procedure.

## 2013-12-27 NOTE — Telephone Encounter (Signed)
Protime Results from [facility] 12/27/2013  INR:  1.5  Current Dose of Coumadin is 2.5mg  once daily. Per Dr. Green---3mg  of Coumadin once daily and recheck in 1 week. Patient wife Notified and faxed back to facility

## 2014-01-17 ENCOUNTER — Other Ambulatory Visit: Payer: Self-pay | Admitting: Internal Medicine

## 2014-01-19 ENCOUNTER — Other Ambulatory Visit: Payer: Self-pay | Admitting: *Deleted

## 2014-01-19 ENCOUNTER — Telehealth: Payer: Self-pay

## 2014-01-19 MED ORDER — WARFARIN SODIUM 4 MG PO TABS: ORAL_TABLET | ORAL | Status: DC

## 2014-01-19 NOTE — Telephone Encounter (Signed)
Followed by [facility]

## 2014-01-19 NOTE — Telephone Encounter (Signed)
Protime Results from [facility]   01/19/2014  INR:  1.7  Per Jacki Cones Coumadin to 4mg  once daily and recheck in 1 week Patient wife Notified and faxed results to [facility] and faxed Rx to pharmacy for Coumadin

## 2014-01-20 NOTE — Telephone Encounter (Signed)
Patient had current Protime done and Coumadin called into pharmacy

## 2014-01-24 ENCOUNTER — Other Ambulatory Visit: Payer: Self-pay | Admitting: Internal Medicine

## 2014-01-24 LAB — BASIC METABOLIC PANEL
BUN: 27 mg/dL — AB (ref 4–21)
Creatinine: 1.7 mg/dL — AB (ref 0.6–1.3)
GLUCOSE: 92 mg/dL
POTASSIUM: 4.1 mmol/L (ref 3.4–5.3)
Sodium: 141 mmol/L (ref 137–147)

## 2014-01-24 LAB — TSH: TSH: 5.76 u[IU]/mL (ref 0.41–5.90)

## 2014-01-24 LAB — LIPID PANEL
Cholesterol: 130 mg/dL (ref 0–200)
HDL: 50 mg/dL (ref 35–70)
LDL Cholesterol: 59 mg/dL
Triglycerides: 105 mg/dL (ref 40–160)

## 2014-01-24 LAB — HEPATIC FUNCTION PANEL
ALT: 41 U/L — AB (ref 10–40)
AST: 29 U/L (ref 14–40)
Alkaline Phosphatase: 42 U/L (ref 25–125)
BILIRUBIN, TOTAL: 0.5 mg/dL

## 2014-01-24 LAB — CBC AND DIFFERENTIAL
HCT: 41 % (ref 41–53)
HEMOGLOBIN: 13.7 g/dL (ref 13.5–17.5)
Platelets: 125 10*3/uL — AB (ref 150–399)
WBC: 5.3 10*3/mL

## 2014-02-01 ENCOUNTER — Other Ambulatory Visit: Payer: Self-pay | Admitting: Internal Medicine

## 2014-02-02 ENCOUNTER — Encounter: Payer: Self-pay | Admitting: Internal Medicine

## 2014-02-02 ENCOUNTER — Non-Acute Institutional Stay: Payer: Medicare Other | Admitting: Internal Medicine

## 2014-02-02 ENCOUNTER — Telehealth: Payer: Self-pay | Admitting: *Deleted

## 2014-02-02 VITALS — BP 170/86 | HR 84 | Wt 222.0 lb

## 2014-02-02 DIAGNOSIS — Z950 Presence of cardiac pacemaker: Secondary | ICD-10-CM

## 2014-02-02 DIAGNOSIS — N184 Chronic kidney disease, stage 4 (severe): Secondary | ICD-10-CM

## 2014-02-02 DIAGNOSIS — R251 Tremor, unspecified: Secondary | ICD-10-CM

## 2014-02-02 DIAGNOSIS — I359 Nonrheumatic aortic valve disorder, unspecified: Secondary | ICD-10-CM | POA: Insufficient documentation

## 2014-02-02 DIAGNOSIS — I10 Essential (primary) hypertension: Secondary | ICD-10-CM

## 2014-02-02 DIAGNOSIS — I2510 Atherosclerotic heart disease of native coronary artery without angina pectoris: Secondary | ICD-10-CM

## 2014-02-02 DIAGNOSIS — I5032 Chronic diastolic (congestive) heart failure: Secondary | ICD-10-CM

## 2014-02-02 DIAGNOSIS — R350 Frequency of micturition: Secondary | ICD-10-CM

## 2014-02-02 DIAGNOSIS — R259 Unspecified abnormal involuntary movements: Secondary | ICD-10-CM

## 2014-02-02 DIAGNOSIS — D500 Iron deficiency anemia secondary to blood loss (chronic): Secondary | ICD-10-CM

## 2014-02-02 DIAGNOSIS — R413 Other amnesia: Secondary | ICD-10-CM

## 2014-02-02 DIAGNOSIS — E782 Mixed hyperlipidemia: Secondary | ICD-10-CM

## 2014-02-02 DIAGNOSIS — I4891 Unspecified atrial fibrillation: Secondary | ICD-10-CM

## 2014-02-02 DIAGNOSIS — I509 Heart failure, unspecified: Secondary | ICD-10-CM

## 2014-02-02 MED ORDER — DOXAZOSIN MESYLATE 2 MG PO TABS: ORAL_TABLET | ORAL | Status: DC

## 2014-02-02 NOTE — Progress Notes (Signed)
Patient ID: Barry Thompson, male   DOB: [birth year], 80 y.o.   MRN: [id number]    Location:  [facility]   Place of Service: Clinic (12)    Allergies  Allergen Reactions  . Mold Extract [Trichophyton]     Chief Complaint  Patient presents with  . Medical Management of Chronic Issues    blood pressure, CKD, CHF, anemia    HPI:  Atrial fibrillation : seems to be in NSR today  Essential hypertension: mildelevation in SBP  Chronic kidney disease, stage IV (severe): Improved  Chronic diastolic congestive heart failure: compensated  Anemia due to chronic blood loss: resolved  Coronary artery disease involving native coronary artery of native heart without angina pectoris: stable  Memory loss: unchanged  Mixed hyperlipidemia: controlled  Pacemaker-Medtronic: functioning OK  Tremor: mild and unchanged  Urine frequency: worse. Asks if he can try doxazosin which worked well for his son.    Medications: Patient's Medications  New Prescriptions   No medications on file  Previous Medications   ALPRAZOLAM (XANAX) 0.25 MG TABLET    TAKE 1 TABLET BY MOUTH UP TO 3 TIMES DAILY AS NEEDED FOR NERVES   AMIODARONE (PACERONE) 200 MG TABLET    Take 1 tablet (200 mg total) by mouth daily.   ATORVASTATIN (LIPITOR) 20 MG TABLET    TAKE 1 TABLET BY MOUTH EVERY DAY   BISACODYL (DULCOLAX) 5 MG EC TABLET    Take 5 mg by mouth daily.   CARVEDILOL (COREG) 25 MG TABLET    TAKE 2 TABLETS BY MOUTH EVERY 12 HOURS FOR BLOOD PRESSURE   CYANOCOBALAMIN 100 MCG TABLET    Take 100 mcg by mouth daily.   FERROUS SULFATE 325 (65 FE) MG TABLET    One daily to treat anemia   FUROSEMIDE (LASIX) 40 MG TABLET    TAKE 1 TABLET DAILY TO CONTROL EDEMA   KLOR-CON M20 20 MEQ TABLET    TAKE 1 TABLET BY MOUTH DAILY.   LEVOTHYROXINE (SYNTHROID, LEVOTHROID) 100 MCG TABLET    TAKE 1 TABLET EVERY DAY FOR THYROID SUPPLEMENT   OMEGA-3 FATTY ACIDS (FISH OIL) 1000 MG CAPS    Take by mouth 2 (two) times daily.   PANTOPRAZOLE (PROTONIX) 40 MG TABLET    TAKE 1 TABLET BY MOUTH EVERY DAY   SPIRONOLACTONE (ALDACTONE) 25 MG TABLET    TAKE 1 TABLET (25 MG TOTAL) BY MOUTH NIGHTLY.   WARFARIN (COUMADIN) 4 MG TABLET    Take one tablet by mouth once daily for anticoagulation   ZOLPIDEM (AMBIEN) 10 MG TABLET    TAKE 1 TABLET AS NEEDED FOR SLEEP  Modified Medications   No medications on file  Discontinued Medications   No medications on file     Review of Systems  Constitutional: Positive for fatigue. Negative for activity change, appetite change and unexpected weight change.  HENT: Positive for hearing loss and tinnitus. Negative for congestion and ear pain.   Eyes: Negative.   Respiratory: Negative for cough, chest tightness and shortness of breath.        History of dyspnea in the past typically with exertion. He feels he is doing better with his.  Cardiovascular: Negative for chest pain, palpitations and leg swelling.       History of aortic valve replacement. History of pacemaker implantation.  Gastrointestinal: Positive for constipation. Negative for abdominal pain and abdominal distention.       Poor appetite.  Endocrine: Negative.   Genitourinary:  History prostate cancer, urinary frequency, and urgency. He is impotent. He has nocturia x2-3. In the daytime he voids every 90 minutes.  Musculoskeletal: Negative.  Negative for neck pain and neck stiffness.  Skin: Positive for pallor. Negative for rash and wound.  Neurological: Positive for tremors. Negative for syncope, weakness and numbness.       Mild forgetfulness.  Hematological: Negative.   Psychiatric/Behavioral: The patient is nervous/anxious.     Filed Vitals:   02/02/14 1507  BP: 170/86  Pulse: 84  Weight: 222 lb (100.699 kg)   Body mass index is 29.3 kg/(m^2).  Physical Exam  Constitutional: He is oriented to person, place, and time. He appears well-developed and well-nourished. No distress.  HENT:  Head: Normocephalic and  atraumatic.  Right Ear: External ear normal.  Left Ear: External ear normal.  Nose: Nose normal.  Mouth/Throat: Oropharynx is clear and moist.  Partial deafness bilaterally.  Eyes: Conjunctivae and EOM are normal. Pupils are equal, round, and reactive to light.  Wears corrective lenses.  Neck: No JVD present. No tracheal deviation present. No thyromegaly present.  Cardiovascular: Normal rate, regular rhythm and intact distal pulses.  Exam reveals no gallop and no friction rub.   2/6 systolic ejection murmur at the base of the heart. Pacemaker left upper anterior chest. Audible systolic click.  Pulmonary/Chest: No respiratory distress. He has no wheezes. He exhibits no tenderness.  Breathing is even and unlabored at rest.  Abdominal: He exhibits no distension and no mass. There is no tenderness.  Musculoskeletal: Normal range of motion. He exhibits no edema and no tenderness.  Lymphadenopathy:    He has no cervical adenopathy.  Neurological: He is alert and oriented to person, place, and time. No cranial nerve deficit. Coordination normal.  Resting tremor bilaterally. Worse on the right side. 01/17/2009 MMSE 25/30. Passed clock drawing.  Skin: No rash noted. No erythema. No pallor.  Multiple seborrheic keratoses. Common Nevus left neck posterior to the ear.  Psychiatric: He has a normal mood and affect. His behavior is normal. Judgment and thought content normal.     Labs reviewed: Nursing Home on 02/02/2014  Component Date Value Ref Range Status  . Hemoglobin 01/24/2014 13.7  13.5 - 17.5 g/dL Final  . HCT 01/24/2014 41  41 - 53 % Final  . Platelets 01/24/2014 125* 150 - 399 K/L Final  . WBC 01/24/2014 5.3   Final  . Glucose 01/24/2014 92   Final  . BUN 01/24/2014 27* 4 - 21 mg/dL Final  . Creatinine 01/24/2014 1.7* 0.6 - 1.3 mg/dL Final  . Potassium 01/24/2014 4.1  3.4 - 5.3 mmol/L Final  . Sodium 01/24/2014 141  137 - 147 mmol/L Final  . Triglycerides 01/24/2014 105  40 - 160  mg/dL Final  . Cholesterol 01/24/2014 130  0 - 200 mg/dL Final  . HDL 01/24/2014 50  35 - 70 mg/dL Final  . LDL Cholesterol 01/24/2014 59   Final  . Alkaline Phosphatase 01/24/2014 42  25 - 125 U/L Final  . ALT 01/24/2014 41* 10 - 40 U/L Final  . AST 01/24/2014 29  14 - 40 U/L Final  . Bilirubin, Total 01/24/2014 0.5   Final  . TSH 01/24/2014 5.76  0.41 - 5.90 uIU/mL Final      Assessment/Plan  1. Atrial fibrillation   Seems to be in NSR or paced  2. Essential hypertension Mild elevation in SBP. Added doxazosin.  3. Chronic kidney disease, stage IV (severe) improved  4. Chronic  diastolic congestive heart failure compensated  5. Anemia due to chronic blood loss resolved  6. Coronary artery disease involving native coronary artery of native heart without angina pectoris stable  7. Memory loss unchanged  8. Mixed hyperlipidemia controlled  9. Pacemaker-Medtronic Functioning appropriately  10. Tremor unuchanged  11. Urine frequency Add:- doxazosin (CARDURA) 2 MG tablet; One daily to reduce urinary frequency  Dispense: 90 tablet; Refill: 3

## 2014-02-02 NOTE — Telephone Encounter (Signed)
Protime Results from 02/02/2014 from [facility]  INR:  4.3  Current Dose of Coumadin is 4mg  once daily Per Dr. Reed--Hold Coumadin tomorrow 02/03/2014 and Saturday 02/04/2014 and restart 4mg  on Sunday and recheck on Monday.  Patient wife Notified and faxed to [facility]

## 2014-02-06 ENCOUNTER — Telehealth: Payer: Self-pay | Admitting: *Deleted

## 2014-02-06 NOTE — Telephone Encounter (Signed)
Received Protime Results from [facility] 02/06/2014  INR:  1.6  Current Dose of Coumadin is 4mg  daily (Held on 7/24 and 7/25) Per Dr. Connye Burkitt to Coumadin 2mg  once daily and recheck on Thursday 02/09/2014  Patient wife Notified and faxed results to [facility]

## 2014-02-07 ENCOUNTER — Encounter: Payer: Self-pay | Admitting: Internal Medicine

## 2014-02-07 ENCOUNTER — Ambulatory Visit (INDEPENDENT_AMBULATORY_CARE_PROVIDER_SITE_OTHER): Payer: Medicare Other | Admitting: Internal Medicine

## 2014-02-07 VITALS — BP 134/73 | HR 77 | Ht 73.0 in | Wt 220.0 lb

## 2014-02-07 DIAGNOSIS — Z950 Presence of cardiac pacemaker: Secondary | ICD-10-CM

## 2014-02-07 DIAGNOSIS — I480 Paroxysmal atrial fibrillation: Secondary | ICD-10-CM

## 2014-02-07 DIAGNOSIS — I442 Atrioventricular block, complete: Secondary | ICD-10-CM

## 2014-02-07 DIAGNOSIS — I4891 Unspecified atrial fibrillation: Secondary | ICD-10-CM

## 2014-02-07 DIAGNOSIS — I2510 Atherosclerotic heart disease of native coronary artery without angina pectoris: Secondary | ICD-10-CM

## 2014-02-07 LAB — MDC_IDC_ENUM_SESS_TYPE_INCLINIC
Brady Statistic AP VP Percent: 99.7 %
Brady Statistic AP VS Percent: 0.1 %
Brady Statistic AS VP Percent: 0.3 %
Brady Statistic AS VS Percent: 0.1 %
Lead Channel Impedance Value: 551 Ohm
Lead Channel Pacing Threshold Pulse Width: 0.4 ms
Lead Channel Pacing Threshold Pulse Width: 0.6 ms
Lead Channel Setting Pacing Pulse Width: 0.4 ms
MDC IDC MSMT LEADCHNL RA IMPEDANCE VALUE: 513 Ohm
MDC IDC MSMT LEADCHNL RA PACING THRESHOLD AMPLITUDE: 1 V
MDC IDC MSMT LEADCHNL RV PACING THRESHOLD AMPLITUDE: 1.25 V
MDC IDC SET LEADCHNL RA PACING AMPLITUDE: 2 V
MDC IDC SET LEADCHNL RV PACING AMPLITUDE: 2.5 V
MDC IDC SET LEADCHNL RV SENSING SENSITIVITY: 0.9 mV

## 2014-02-07 NOTE — Progress Notes (Signed)
Patient Care Team: Estill Dooms, MD as PCP - General (Internal Medicine) Anson Crofts, MD as Attending Physician (Cardiothoracic Surgery)   HPI  Barry Thompson is a 80 y.o. male Seen to establish pacemaker followup for device implanated ath [facility] following corevalve TAVR  He has a history of atrial fibrillation for which she takes amiodarone. Surveillance laboratories last week notable for mild elevation in his ALT he denies significant problems with shortness of breath or chest pain.  He had peripheral edema which is chronic   He has had no syncope.    Past Medical History  Diagnosis Date  . Hypertension   . Hyperlipidemia   . Aortic stenosis     Moderately Severe by echo 07/2011  . 1St degree AV block   . Shortness of breath   . Angina   . Chronic kidney disease     renal insufficiency  . Pleural effusion     07/2011 - Left > Right - tapped (L) - Transudative  . Atrial fibrillation     07/2011 - amio and Rivaroxaban initiated - tee/dccv  . Diastolic CHF, chronic     07/2011 NL EF by echo  . Orthostatic hypotension   . Coronary artery disease 01/19/2012  . Cardiac pacemaker in situ 06/01/2012  . Unspecified hypothyroidism 09/24/2011  . Anxiety state, unspecified 09/24/2011  . Unspecified cataract 02/04/2011  . Disturbance of salivary secretion 07/25/2009  . Edema 09/05/2008  . Shortness of breath 09/05/2008  . 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 Insomnia, unspecified 05/09/2005  . Urinary frequency 05/09/2005  . Malignant neoplasm of prostate 03/04/2005  . Hypertrophy of prostate without urinary obstruction and other lower urinary tract symptoms (LUTS) 02/12/2005  . Other malaise and fatigue 02/12/2005  . Nocturia 02/22/2003  . Disturbances of sensation of smell and taste 03/16/2001  . Other specified cardiac dysrhythmias(427.89) 11/19/1999  . Anemia, unspecified   . Deaf   . Tremor 10/29/2013  . Memory loss 10/29/2013    Past Surgical  History  Procedure Laterality Date  . Tee without cardioversion  07/17/2011    Procedure: TRANSESOPHAGEAL ECHOCARDIOGRAM (TEE);  Surgeon: Lelon Perla, MD;  Location: [facility] ENDOSCOPY;  Service: Cardiovascular;  Laterality: N/A;  With Cardioversion  . Hemorroidectomy  1968  . Spine surgery  1993    HNP(RUPTURED DISC)[name]  . Prostate surgery  05/26/2005    Dr. Terance Hart  . Eye surgery  2008    left eye ( Dr. Charise Killian)  . Esophagogastroduodenoscopy N/A 04/15/2013    Procedure: ESOPHAGOGASTRODUODENOSCOPY (EGD);  Surgeon: Cleotis Nipper, MD;  Location: [facility] ENDOSCOPY;  Service: Endoscopy;  Laterality: N/A;  . Esophagogastroduodenoscopy N/A 04/18/2013    Procedure: ESOPHAGOGASTRODUODENOSCOPY (EGD);  Surgeon: Wonda Horner, MD;  Location: [facility] ENDOSCOPY;  Service: Endoscopy;  Laterality: N/A;    Current Outpatient Prescriptions  Medication Sig Dispense Refill  . ALPRAZolam (XANAX) 0.25 MG tablet TAKE 1 TABLET BY MOUTH UP TO 3 TIMES DAILY AS NEEDED FOR NERVES  100 tablet  0  . amiodarone (PACERONE) 200 MG tablet Take 1 tablet (200 mg total) by mouth daily.  30 tablet  5  . atorvastatin (LIPITOR) 20 MG tablet TAKE 1 TABLET BY MOUTH EVERY DAY  30 tablet  5  . bisacodyl (DULCOLAX) 5 MG EC tablet Take 5 mg by mouth daily.      . carvedilol (COREG) 25 MG tablet TAKE 2 TABLETS BY MOUTH EVERY 12 HOURS FOR BLOOD PRESSURE  120 tablet  4  . cyanocobalamin 100 MCG tablet Take 100 mcg by mouth daily.      Marland Kitchen doxazosin (CARDURA) 2 MG tablet One daily to reduce urinary frequency  90 tablet  3  . ferrous sulfate 325 (65 FE) MG tablet One daily to treat anemia  100 tablet  3  . furosemide (LASIX) 40 MG tablet TAKE 1 TABLET DAILY TO CONTROL EDEMA  30 tablet  0  . KLOR-CON M20 20 MEQ tablet TAKE 1 TABLET BY MOUTH DAILY.  90 tablet  0  . levothyroxine (SYNTHROID, LEVOTHROID) 100 MCG tablet TAKE 1 TABLET EVERY DAY FOR THYROID SUPPLEMENT  30 tablet  5  . Omega-3 Fatty Acids (FISH OIL) 1000 MG CAPS Take by mouth  2 (two) times daily.      . pantoprazole (PROTONIX) 40 MG tablet TAKE 1 TABLET BY MOUTH EVERY DAY  30 tablet  2  . spironolactone (ALDACTONE) 25 MG tablet TAKE 1 TABLET (25 MG TOTAL) BY MOUTH NIGHTLY.  30 tablet  0  . warfarin (COUMADIN) 4 MG tablet Take one tablet by mouth once daily for anticoagulation  30 tablet  3  . zolpidem (AMBIEN) 10 MG tablet TAKE 1 TABLET AS NEEDED FOR SLEEP  30 tablet  0   No current facility-administered medications for this visit.    Allergies  Allergen Reactions  . Mold Extract [Trichophyton]     Review of Systems negative except from HPI and PMH  Physical Exam BP 134/73  Pulse 77  Ht 6\' 1"  (1.854 m)  Wt 220 lb (99.791 kg)  BMI 29.03 kg/m2 Well developed and well nourished in no acute distress HENT normal E scleral and icterus clear Neck Supple JVP flat; carotids brisk and full Clear to ausculation Device pocket well healed; without hematoma or erythema. There is significant subcutaneous after she There is no tethering  Regular rate and rhythm,  2/6 systolic  gallops or rub Soft with active bowel sounds No clubbing cyanosis 1+ Edema Alert and oriented, grossly normal motor and sensory function Skin Warm and Dry  Electrocardiogram demonstrates AV pacing   Assessment and  Plan  Pacemaker-Medtronic The patient's device was interrogated.  The information was reviewed. No changes were made in the programming.    Complete heart block   aortic stenosis status post  Atrial fibrillation there has been no interval atrial fibrillation. I recommended that we decrease his amiodarone from 1400 mg--1000 mg a week. He is disinclined to acquiesce to my request.  I will defer that decision to Dr. Kyla Balzarine long-standing relationship with the patient's. We'll check him again  a 6 months time

## 2014-02-07 NOTE — Patient Instructions (Addendum)
Your physician recommends that you continue on your current medications as directed. Please refer to the Current Medication list given to you today.  Your physician recommends that you schedule a follow-up appointment in: 6 months with Dr. Johnsie Cancel and device clinic  Your physician wants you to follow-up in: 1 year with Dr. Caryl Comes. You will receive a reminder letter in the mail two months in advance. If you don't receive a letter, please call our office to schedule the follow-up appointment.

## 2014-02-08 ENCOUNTER — Other Ambulatory Visit: Payer: Self-pay | Admitting: Internal Medicine

## 2014-02-09 ENCOUNTER — Encounter: Payer: Self-pay | Admitting: Internal Medicine

## 2014-02-09 ENCOUNTER — Telehealth: Payer: Self-pay | Admitting: *Deleted

## 2014-02-09 LAB — PROTIME-INR

## 2014-02-09 NOTE — Telephone Encounter (Signed)
Received Protime Results from [facility] 02/09/2014  INR:  1.3  Current Dose of Coumadin is 2mg  daily Per Dr. Sharol Harness Coumadin to 2mg  alternating with 3mg  and recheck in 1 week. Patient wife Notified and faxed results back to [facility]

## 2014-02-10 ENCOUNTER — Encounter: Payer: Self-pay | Admitting: Internal Medicine

## 2014-02-14 ENCOUNTER — Other Ambulatory Visit: Payer: Self-pay | Admitting: Internal Medicine

## 2014-02-16 ENCOUNTER — Telehealth: Payer: Self-pay | Admitting: *Deleted

## 2014-02-16 LAB — PROTIME-INR

## 2014-02-16 NOTE — Telephone Encounter (Signed)
Protime Results from [facility] 02/16/2014  INR:  1.7  Current Dose of Coumadin: 2mg  alternating with 3mg  daily Per Dr. Sharol Harness Coumadin to 3mg  daily and recheck in 1 week. Patient Notified and faxed to [facility]

## 2014-02-20 LAB — PROTIME-INR

## 2014-02-21 ENCOUNTER — Telehealth: Payer: Self-pay | Admitting: *Deleted

## 2014-02-21 NOTE — Telephone Encounter (Signed)
Protime Results from [facility] 02/20/2014  INR:  1.5  Current Dose of Coumadin is 3mg  daily Per Dr. Dierdre Harness Coumadin to 4 mg daily and recheck on Thursday 02/23/2014 Patient Notified but did not understand. Stated that his wife took care of his medications and she had to have surgery and go into [place]. I called the [facility] apartment nurse and she stated that she would go to patient and explain and help him. Faxed results to [facility]

## 2014-02-22 ENCOUNTER — Other Ambulatory Visit: Payer: Self-pay | Admitting: Internal Medicine

## 2014-02-23 ENCOUNTER — Other Ambulatory Visit: Payer: Self-pay | Admitting: Internal Medicine

## 2014-02-23 LAB — PROTIME-INR: INR: 3.4 — AB (ref 0.9–1.1)

## 2014-02-28 ENCOUNTER — Telehealth: Payer: Self-pay

## 2014-02-28 NOTE — Telephone Encounter (Signed)
Message received via fax on 02/27/14: Patient is currently taking 3 mg daily of coumadin as of 02/23/14 Current PT/INR 2.8 Previous PT/INR 3.4  Per Dr.Reed continue coumadin @ 3 mg daily and recheck in 1 week Fax was sent back to [facility] [place] yesterday   Patient was also informed of results and instructions. Patient verbalized understanding

## 2014-03-06 ENCOUNTER — Other Ambulatory Visit: Payer: Self-pay | Admitting: Internal Medicine

## 2014-03-13 ENCOUNTER — Other Ambulatory Visit: Payer: Self-pay | Admitting: Nurse Practitioner

## 2014-03-13 ENCOUNTER — Other Ambulatory Visit: Payer: Self-pay | Admitting: Internal Medicine

## 2014-03-24 ENCOUNTER — Other Ambulatory Visit: Payer: Self-pay | Admitting: Internal Medicine

## 2014-04-03 ENCOUNTER — Other Ambulatory Visit: Payer: Self-pay | Admitting: *Deleted

## 2014-04-03 MED ORDER — AMLODIPINE BESYLATE 10 MG PO TABS: ORAL_TABLET | ORAL | Status: DC

## 2014-04-03 MED ORDER — LEVOTHYROXINE SODIUM 100 MCG PO TABS: ORAL_TABLET | ORAL | Status: DC

## 2014-04-03 MED ORDER — CARVEDILOL 25 MG PO TABS: ORAL_TABLET | ORAL | Status: DC

## 2014-04-03 MED ORDER — FUROSEMIDE 40 MG PO TABS: ORAL_TABLET | ORAL | Status: DC

## 2014-04-03 MED ORDER — PANTOPRAZOLE SODIUM 40 MG PO TBEC: DELAYED_RELEASE_TABLET | ORAL | Status: DC

## 2014-04-03 MED ORDER — AMIODARONE HCL 200 MG PO TABS: ORAL_TABLET | ORAL | Status: DC

## 2014-04-03 MED ORDER — SPIRONOLACTONE 25 MG PO TABS: ORAL_TABLET | ORAL | Status: DC

## 2014-04-03 MED ORDER — ATORVASTATIN CALCIUM 20 MG PO TABS: ORAL_TABLET | ORAL | Status: DC

## 2014-04-03 NOTE — Telephone Encounter (Signed)
[facility].  

## 2014-04-04 ENCOUNTER — Telehealth: Payer: Self-pay | Admitting: *Deleted

## 2014-04-04 ENCOUNTER — Other Ambulatory Visit: Payer: Self-pay | Admitting: *Deleted

## 2014-04-04 MED ORDER — FUROSEMIDE 40 MG PO TABS: ORAL_TABLET | ORAL | Status: DC

## 2014-04-04 MED ORDER — LEVOTHYROXINE SODIUM 100 MCG PO TABS: ORAL_TABLET | ORAL | Status: DC

## 2014-04-04 MED ORDER — ATORVASTATIN CALCIUM 20 MG PO TABS: ORAL_TABLET | ORAL | Status: DC

## 2014-04-04 MED ORDER — CARVEDILOL 25 MG PO TABS: ORAL_TABLET | ORAL | Status: DC

## 2014-04-04 MED ORDER — SPIRONOLACTONE 25 MG PO TABS: ORAL_TABLET | ORAL | Status: DC

## 2014-04-04 MED ORDER — PANTOPRAZOLE SODIUM 40 MG PO TBEC: DELAYED_RELEASE_TABLET | ORAL | Status: DC

## 2014-04-04 NOTE — Telephone Encounter (Signed)
[facility].  

## 2014-04-04 NOTE — Telephone Encounter (Signed)
[facility] [address] 

## 2014-04-04 NOTE — Telephone Encounter (Signed)
Protime Results from [facility] 04/03/2014  INR:  1.5  Current Dose of Coumadin is 3mg  once daily Per Dr. Reed---Continue 3mg  daily and recheck in 1 week Patient Notified and faxed to [facility].

## 2014-04-05 ENCOUNTER — Other Ambulatory Visit: Payer: Self-pay | Admitting: *Deleted

## 2014-04-05 MED ORDER — AMIODARONE HCL 200 MG PO TABS: ORAL_TABLET | ORAL | Status: DC

## 2014-04-05 MED ORDER — AMLODIPINE BESYLATE 10 MG PO TABS: ORAL_TABLET | ORAL | Status: DC

## 2014-04-05 NOTE — Telephone Encounter (Signed)
[facility].  

## 2014-04-06 LAB — BASIC METABOLIC PANEL
BUN: 37 mg/dL — AB (ref 4–21)
CREATININE: 2.2 mg/dL — AB (ref 0.6–1.3)
Glucose: 105 mg/dL
Potassium: 4.3 mmol/L (ref 3.4–5.3)
SODIUM: 139 mmol/L (ref 137–147)

## 2014-04-06 LAB — TSH: TSH: 4.7 u[IU]/mL (ref 0.41–5.90)

## 2014-04-10 ENCOUNTER — Telehealth: Payer: Self-pay | Admitting: *Deleted

## 2014-04-10 LAB — POCT INR: INR: 2.8 — AB (ref 0.9–1.1)

## 2014-04-10 NOTE — Telephone Encounter (Signed)
Protime Results from [facility] 04/10/2014  INR:  2.8  Current Dose of Coumadin: 3mg  once daily Per Dr. Mariea Clonts: Continue 3mg  daily and recheck in 1 month Patient wife Notified and faxed to [facility].

## 2014-04-13 ENCOUNTER — Non-Acute Institutional Stay: Payer: Medicare Other | Admitting: Internal Medicine

## 2014-04-13 ENCOUNTER — Encounter: Payer: Self-pay | Admitting: Internal Medicine

## 2014-04-13 VITALS — BP 124/72 | HR 80 | Wt 218.0 lb

## 2014-04-13 DIAGNOSIS — R413 Other amnesia: Secondary | ICD-10-CM

## 2014-04-13 DIAGNOSIS — I5032 Chronic diastolic (congestive) heart failure: Secondary | ICD-10-CM

## 2014-04-13 DIAGNOSIS — R350 Frequency of micturition: Secondary | ICD-10-CM

## 2014-04-13 DIAGNOSIS — I10 Essential (primary) hypertension: Secondary | ICD-10-CM

## 2014-04-13 DIAGNOSIS — N184 Chronic kidney disease, stage 4 (severe): Secondary | ICD-10-CM

## 2014-04-13 DIAGNOSIS — I2510 Atherosclerotic heart disease of native coronary artery without angina pectoris: Secondary | ICD-10-CM

## 2014-04-13 DIAGNOSIS — E039 Hypothyroidism, unspecified: Secondary | ICD-10-CM

## 2014-04-13 DIAGNOSIS — R251 Tremor, unspecified: Secondary | ICD-10-CM

## 2014-04-13 DIAGNOSIS — I4891 Unspecified atrial fibrillation: Secondary | ICD-10-CM

## 2014-04-13 DIAGNOSIS — D500 Iron deficiency anemia secondary to blood loss (chronic): Secondary | ICD-10-CM

## 2014-04-13 DIAGNOSIS — E782 Mixed hyperlipidemia: Secondary | ICD-10-CM

## 2014-04-13 DIAGNOSIS — Z950 Presence of cardiac pacemaker: Secondary | ICD-10-CM

## 2014-04-13 NOTE — Progress Notes (Signed)
Patient ID: Barry Thompson, male   DOB: 10/30/1923, 80 y.o.   MRN: [id number]    [facility]  Independent Place of Service: Clinic (12)     Allergies  Allergen Reactions  . Mold Extract [Trichophyton]     Chief Complaint  Patient presents with  . Medical Management of Chronic Issues    blood pressure, A-Fib, CKD, CHF, thyroid    HPI:  Atrial fibrillation  : I think he is in NSR  Essential hypertension: controlled  Chronic kidney disease, stage IV (severe): slightly improved  Chronic diastolic congestive heart failure: compensated  Anemia due to chronic blood loss: resolved  Coronary artery disease involving native coronary artery of native heart without angina pectoris: stable  Memory loss: unchanged  Mixed hyperlipidemia: recheck next visit  Tremor: stable  Pacemaker-Medtronic: functioning  Urine frequency: unchanged  Hypothyroidism, unspecified hypothyroidism type: mild elevation in the TSH    Medications: Patient's Medications  New Prescriptions   No medications on file  Previous Medications   ALPRAZOLAM (XANAX) 0.25 MG TABLET    TAKE 1 TABLET BY MOUTH UP TO 3 TIMES DAILY AS NEEDED FOR NERVES   AMIODARONE (PACERONE) 200 MG TABLET    Take one tablet by mouth once daily   AMLODIPINE (NORVASC) 10 MG TABLET    Take one tablet by mouth once daily for blood pressure   ATORVASTATIN (LIPITOR) 20 MG TABLET    Take one tablet by mouth once daily for cholesterol   BISACODYL (DULCOLAX) 5 MG EC TABLET    Take 5 mg by mouth daily.   CARVEDILOL (COREG) 25 MG TABLET    Take two tablets by mouth every 12 hours for blood pressure   CYANOCOBALAMIN 100 MCG TABLET    Take 100 mcg by mouth daily.   DOXAZOSIN (CARDURA) 2 MG TABLET    One daily to reduce urinary frequency   FERROUS SULFATE 325 (65 FE) MG TABLET    One daily to treat anemia   FUROSEMIDE (LASIX) 40 MG TABLET    Take one tablet by mouth once daily to control edema   KLOR-CON M20 20 MEQ  TABLET    TAKE 1 TABLET BY MOUTH DAILY.   LEVOTHYROXINE (SYNTHROID, LEVOTHROID) 100 MCG TABLET    Take one tablet by mouth once daily for thyroid   OMEGA-3 FATTY ACIDS (FISH OIL) 1000 MG CAPS    Take by mouth 2 (two) times daily.   PANTOPRAZOLE (PROTONIX) 40 MG TABLET    Take one tablet by mouth once daily for stomach   SPIRONOLACTONE (ALDACTONE) 25 MG TABLET    Take one tablet by mouth nightly   WARFARIN (COUMADIN) 4 MG TABLET    Take one tablet by mouth once daily for anticoagulation   ZOLPIDEM (AMBIEN) 10 MG TABLET    TAKE 1 TABLET AT BEDTIME AS NEEDED FOR SLEEP  Modified Medications   No medications on file  Discontinued Medications   No medications on file     Review of Systems  Constitutional: Positive for fatigue. Negative for activity change, appetite change and unexpected weight change.  HENT: Positive for hearing loss and tinnitus. Negative for congestion and ear pain.   Eyes: Negative.   Respiratory: Negative for cough, chest tightness and shortness of breath.        History of dyspnea in the past typically with exertion. He feels he is doing better with his.  Cardiovascular: Negative for chest pain, palpitations and leg swelling.  History of aortic valve replacement. History of pacemaker implantation.  Gastrointestinal: Positive for constipation. Negative for abdominal pain and abdominal distention.       Poor appetite.  Endocrine: Negative.   Genitourinary:       History prostate cancer, urinary frequency, and urgency. He is impotent. He has nocturia x2-3. In the daytime he voids every 90 minutes.  Musculoskeletal: Negative.  Negative for neck pain and neck stiffness.  Skin: Positive for pallor. Negative for rash and wound.  Neurological: Positive for tremors. Negative for syncope, weakness and numbness.       Mild forgetfulness.  Hematological: Negative.   Psychiatric/Behavioral: The patient is nervous/anxious.     Filed Vitals:   04/13/14 1610  BP: 124/72    Pulse: 80  Weight: 218 lb (98.884 kg)  SpO2: 95%   Body mass index is 28.77 kg/(m^2).  Physical Exam  Constitutional: He is oriented to person, place, and time. He appears well-developed and well-nourished. No distress.  HENT:  Head: Normocephalic and atraumatic.  Right Ear: External ear normal.  Left Ear: External ear normal.  Nose: Nose normal.  Mouth/Throat: Oropharynx is clear and moist.  Partial deafness bilaterally.  Eyes: Conjunctivae and EOM are normal. Pupils are equal, round, and reactive to light.  Wears corrective lenses.  Neck: No JVD present. No tracheal deviation present. No thyromegaly present.  Cardiovascular: Normal rate, regular rhythm and intact distal pulses.  Exam reveals no gallop and no friction rub.   2/6 systolic ejection murmur at the base of the heart. Pacemaker left upper anterior chest. Audible systolic click.  Pulmonary/Chest: No respiratory distress. He has no wheezes. He exhibits no tenderness.  Breathing is even and unlabored at rest.  Abdominal: He exhibits no distension and no mass. There is no tenderness.  Musculoskeletal: Normal range of motion. He exhibits no edema and no tenderness.  Lymphadenopathy:    He has no cervical adenopathy.  Neurological: He is alert and oriented to person, place, and time. No cranial nerve deficit. Coordination normal.  Resting tremor bilaterally. Worse on the right side. 01/17/2009 MMSE 25/30. Passed clock drawing.  Skin: No rash noted. No erythema. No pallor.  Multiple seborrheic keratoses. Common Nevus left neck posterior to the ear.  Psychiatric: He has a normal mood and affect. His behavior is normal. Judgment and thought content normal.     Labs reviewed: Nursing Home on 04/13/2014  Component Date Value Ref Range Status  . Glucose 04/06/2014 105   Final  . BUN 04/06/2014 37* 4 - 21 mg/dL Final  . Creatinine 04/06/2014 2.2* 0.6 - 1.3 mg/dL Final  . Potassium 04/06/2014 4.3  3.4 - 5.3 mmol/L Final  .  Sodium 04/06/2014 139  137 - 147 mmol/L Final  . TSH 04/06/2014 4.70  0.41 - 5.90 uIU/mL Final  Telephone on 04/10/2014  Component Date Value Ref Range Status  . INR 04/10/2014 2.8* 0.9 - 1.1 Final  Scanned Document on 03/06/2014  Component Date Value Ref Range Status  . INR 02/23/2014 3.4* 0.9 - 1.1 Final  Office Visit on 02/07/2014  Component Date Value Ref Range Status  . Pulse Generator Manufacturer 02/07/2014 Medtronic   Final  . Pulse Gen Model 02/07/2014 [id number] Advisa DR MRI   Final  . Pulse Gen Serial Number 02/07/2014 [id number]   Final  . RV Sense Sensitivity 02/07/2014 0.9   Final  . RA Pace Amplitude 02/07/2014 2   Final  . RV Pace PulseWidth 02/07/2014 0.4   Final  . RV  Pace Amplitude 02/07/2014 2.5   Final  . RA Impedance 02/07/2014 513   Final  . RA Pacing Amplitude 02/07/2014 1   Final  . RA Pacing PulseWidth 02/07/2014 0.6   Final  . RV IMPEDANCE 02/07/2014 551   Final  . RV Pacing Amplitude 02/07/2014 1.25   Final  . RV Pacing PulseWidth 02/07/2014 0.4   Final  . Brady AP VP Percent 02/07/2014 99.7   Final  . Loletha Grayer AS VP Percent 02/07/2014 0.3   Final  . Loletha Grayer AP VS Percent 02/07/2014 0.1   Final  . Loletha Grayer AS VS Percent 02/07/2014 0.1   Final  . Eval Rhythm 02/07/2014 Paced @ 30   Final  . Bradycardia Comment 02/07/2014 MVP Off   Final  . Miscellaneous Comment 02/07/2014    Final                   Value:Pacemaker check in clinic. Normal device function. Thresholds, sensing, impedances consistent with previous measurements. Device programmed to maximize longevity. No mode switch or high ventricular rates noted. Device programmed at appropriate safety                          margins--changed RA output from 2.25 to 2.00 @ 0.60 and changed RV output from 2.75 to 2.50 V. Histogram distribution appropriate for patient activity level. Device programmed to optimize intrinsic conduction. Estimated longevity 6.5 years. ROV in 6 mths                          w/device clinic and  12 mths w/SK.  [facility]ursing Home on 02/02/2014  Component Date Value Ref Range Status  . Hemoglobin 01/24/2014 13.7  13.5 - 17.5 g/dL Final  . HCT 01/24/2014 41  41 - 53 % Final  . Platelets 01/24/2014 125* 150 - 399 K/L Final  . WBC 01/24/2014 5.3   Final  . Glucose 01/24/2014 92   Final  . BUN 01/24/2014 27* 4 - 21 mg/dL Final  . Creatinine 01/24/2014 1.7* 0.6 - 1.3 mg/dL Final  . Potassium 01/24/2014 4.1  3.4 - 5.3 mmol/L Final  . Sodium 01/24/2014 141  137 - 147 mmol/L Final  . Triglycerides 01/24/2014 105  40 - 160 mg/dL Final  . Cholesterol 01/24/2014 130  0 - 200 mg/dL Final  . HDL 01/24/2014 50  35 - 70 mg/dL Final  . LDL Cholesterol 01/24/2014 59   Final  . Alkaline Phosphatase 01/24/2014 42  25 - 125 U/L Final  . ALT 01/24/2014 41* 10 - 40 U/L Final  . AST 01/24/2014 29  14 - 40 U/L Final  . Bilirubin, Total 01/24/2014 0.5   Final  . TSH 01/24/2014 5.76  0.41 - 5.90 uIU/mL Final     Assessment/Plan  1. Atrial fibrillation   NSR today  2. Essential hypertension controlled  3. Chronic kidney disease, stage IV (severe) Slightly improved  4. Chronic diastolic congestive heart failure compensated  5. Anemia due to chronic blood loss resolved  6. Coronary artery disease involving native coronary artery of native heart without angina pectoris stable  7. Memory loss unchanged  8. Mixed hyperlipidemia  -lipids  9. Tremor unchanged  10. Pacemaker-Medtronic functioning  11. Urine frequency unchanged  12. Hypothyroidism, unspecified hypothyroidism type -TSH, FUTURE

## 2014-04-17 ENCOUNTER — Other Ambulatory Visit: Payer: Self-pay | Admitting: Internal Medicine

## 2014-04-25 ENCOUNTER — Other Ambulatory Visit: Payer: Self-pay | Admitting: Internal Medicine

## 2014-04-27 ENCOUNTER — Other Ambulatory Visit: Payer: Self-pay | Admitting: Internal Medicine

## 2014-05-02 ENCOUNTER — Other Ambulatory Visit: Payer: Self-pay | Admitting: Internal Medicine

## 2014-05-08 LAB — PROTIME-INR: INR: 5.2 — AB (ref 0.9–1.1)

## 2014-05-10 ENCOUNTER — Telehealth: Payer: Self-pay | Admitting: *Deleted

## 2014-05-10 LAB — POCT INR: INR: 3.5 — AB (ref 0.9–1.1)

## 2014-05-10 NOTE — Telephone Encounter (Signed)
Protime Results from [facility] 05/10/2014  INR:  3.5  3mg  daily except held on 05/09/2014 Per Dr. Barnie Alderman Coumadin and recheck 05/11/2014 Patient wife notified and faxed to [facility]

## 2014-05-11 LAB — PROTIME-INR: INR: 2.3 — AB (ref 0.9–1.1)

## 2014-05-12 ENCOUNTER — Other Ambulatory Visit: Payer: Self-pay | Admitting: Nurse Practitioner

## 2014-05-12 ENCOUNTER — Other Ambulatory Visit: Payer: Self-pay | Admitting: Internal Medicine

## 2014-05-12 ENCOUNTER — Telehealth: Payer: Self-pay | Admitting: *Deleted

## 2014-05-12 NOTE — Telephone Encounter (Signed)
Protime Results from [facility] 05/11/2014  INR:  2.3  Patient is currently holding Coumadin Per Dr. Verdis Prime Coumadin 3mg  daily and recheck in one week  Tried calling patient 05/11/14 and NA. Faxed to [place] [facility] and she notified patient yesterday. Called and confirmed with patient this morning.

## 2014-05-16 ENCOUNTER — Other Ambulatory Visit: Payer: Self-pay | Admitting: Internal Medicine

## 2014-05-18 LAB — POCT INR: INR: 1.8 — AB (ref 0.9–1.1)

## 2014-05-19 ENCOUNTER — Emergency Department (HOSPITAL_COMMUNITY)
Admission: EM | Admit: 2014-05-19 | Discharge: 2014-05-19 | Disposition: A | Discharge status: Home / Self Care | Payer: Medicare Other | Attending: Emergency Medicine | Admitting: Emergency Medicine

## 2014-05-19 ENCOUNTER — Encounter (HOSPITAL_COMMUNITY): Payer: Self-pay | Admitting: Emergency Medicine

## 2014-05-19 ENCOUNTER — Telehealth: Payer: Self-pay | Admitting: *Deleted

## 2014-05-19 ENCOUNTER — Emergency Department (HOSPITAL_COMMUNITY): Payer: Medicare Other

## 2014-05-19 DIAGNOSIS — N189 Chronic kidney disease, unspecified: Secondary | ICD-10-CM | POA: Insufficient documentation

## 2014-05-19 DIAGNOSIS — I5032 Chronic diastolic (congestive) heart failure: Secondary | ICD-10-CM | POA: Insufficient documentation

## 2014-05-19 DIAGNOSIS — E785 Hyperlipidemia, unspecified: Secondary | ICD-10-CM | POA: Insufficient documentation

## 2014-05-19 DIAGNOSIS — R0600 Dyspnea, unspecified: Secondary | ICD-10-CM | POA: Diagnosis not present

## 2014-05-19 DIAGNOSIS — I129 Hypertensive chronic kidney disease with stage 1 through stage 4 chronic kidney disease, or unspecified chronic kidney disease: Secondary | ICD-10-CM | POA: Diagnosis not present

## 2014-05-19 DIAGNOSIS — I2510 Atherosclerotic heart disease of native coronary artery without angina pectoris: Secondary | ICD-10-CM | POA: Diagnosis not present

## 2014-05-19 DIAGNOSIS — R0602 Shortness of breath: Secondary | ICD-10-CM | POA: Diagnosis present

## 2014-05-19 DIAGNOSIS — Z7901 Long term (current) use of anticoagulants: Secondary | ICD-10-CM | POA: Insufficient documentation

## 2014-05-19 DIAGNOSIS — R011 Cardiac murmur, unspecified: Secondary | ICD-10-CM | POA: Diagnosis not present

## 2014-05-19 DIAGNOSIS — Z8546 Personal history of malignant neoplasm of prostate: Secondary | ICD-10-CM | POA: Insufficient documentation

## 2014-05-19 DIAGNOSIS — F419 Anxiety disorder, unspecified: Secondary | ICD-10-CM | POA: Insufficient documentation

## 2014-05-19 DIAGNOSIS — D649 Anemia, unspecified: Secondary | ICD-10-CM | POA: Diagnosis not present

## 2014-05-19 DIAGNOSIS — Z950 Presence of cardiac pacemaker: Secondary | ICD-10-CM | POA: Diagnosis not present

## 2014-05-19 DIAGNOSIS — Z79899 Other long term (current) drug therapy: Secondary | ICD-10-CM | POA: Insufficient documentation

## 2014-05-19 DIAGNOSIS — H9190 Unspecified hearing loss, unspecified ear: Secondary | ICD-10-CM | POA: Insufficient documentation

## 2014-05-19 LAB — PROTIME-INR
INR: 1.75 — ABNORMAL HIGH (ref 0.00–1.49)
PROTHROMBIN TIME: 20.6 s — AB (ref 11.6–15.2)

## 2014-05-19 LAB — TROPONIN I: Troponin I: <0.3 ng/mL (ref <0.30)

## 2014-05-19 LAB — CBC WITH DIFFERENTIAL/PLATELET
BASOS ABS: 0 10*3/uL (ref 0.0–0.1)
BASOS PCT: 1 % (ref 0–1)
EOS ABS: 0.1 10*3/uL (ref 0.0–0.7)
Eosinophils Relative: 2 % (ref 0–5)
HCT: 42.6 % (ref 39.0–52.0)
Hemoglobin: 14.4 g/dL (ref 13.0–17.0)
Lymphocytes Relative: 26 % (ref 12–46)
Lymphs Abs: 1.3 10*3/uL (ref 0.7–4.0)
MCH: 29.6 pg (ref 26.0–34.0)
MCHC: 33.8 g/dL (ref 30.0–36.0)
MCV: 87.5 fL (ref 78.0–100.0)
Monocytes Absolute: 0.7 10*3/uL (ref 0.1–1.0)
Monocytes Relative: 13 % — ABNORMAL HIGH (ref 3–12)
Neutro Abs: 2.9 10*3/uL (ref 1.7–7.7)
Neutrophils Relative %: 59 % (ref 43–77)
PLATELETS: 115 10*3/uL — AB (ref 150–400)
RBC: 4.87 MIL/uL (ref 4.22–5.81)
RDW: 13.5 % (ref 11.5–15.5)
WBC: 4.9 10*3/uL (ref 4.0–10.5)

## 2014-05-19 LAB — I-STAT TROPONIN, ED: Troponin i, poc: 0 ng/mL (ref 0.00–0.08)

## 2014-05-19 LAB — PRO B NATRIURETIC PEPTIDE: PRO B NATRI PEPTIDE: 501.7 pg/mL — AB (ref 0–450)

## 2014-05-19 LAB — BASIC METABOLIC PANEL
ANION GAP: 14 (ref 5–15)
BUN: 31 mg/dL — ABNORMAL HIGH (ref 6–23)
CHLORIDE: 99 meq/L (ref 96–112)
CO2: 25 mEq/L (ref 19–32)
CREATININE: 1.97 mg/dL — AB (ref 0.50–1.35)
Calcium: 9.2 mg/dL (ref 8.4–10.5)
GFR calc Af Amer: 33 mL/min — ABNORMAL LOW (ref >90)
GFR calc non Af Amer: 29 mL/min — ABNORMAL LOW (ref >90)
GLUCOSE: 103 mg/dL — AB (ref 70–99)
Potassium: 4.8 mEq/L (ref 3.7–5.3)
Sodium: 138 mEq/L (ref 137–147)

## 2014-05-19 NOTE — ED Provider Notes (Signed)
CSN: [id number]     Arrival date & time 05/19/14  0143 History   First MD Initiated Contact with Patient 05/19/14 0217     Chief Complaint  Patient presents with  . Shortness of Breath     (Consider location/radiation/quality/duration/timing/severity/associated sxs/prior Treatment) HPI Comments: 80 year old male with a history of A. fib on anticoagulation with Xarelto and pacemaker, aortic stenosis status post aortic valve replacement, hypertension, CKD comes in with cc of dib. Pt was doing well all day yday, andwent to bed in good spirits, but started feeling short of breath at time. Pt's dyspnea got worse in the middle of the night, and EMS was called. PT reports that as soon he was sat up and given oxygen - he felt better. O2 was turned off by the RN during my evaluation and pt continued to feel ok. No chest pain, palpitations. No cough. No fevers. Pt taking meds as prescribed.    Patient is a 79 y.o. male presenting with shortness of breath. The history is provided by the patient.  Shortness of Breath Associated symptoms: no abdominal pain, no chest pain and no cough     Past Medical History  Diagnosis Date  . Hypertension   . Hyperlipidemia   . Aortic stenosis     Moderately Severe by echo 07/2011  . 1St degree AV block   . Shortness of breath   . Angina   . Chronic kidney disease     renal insufficiency  . Pleural effusion     07/2011 - Left > Right - tapped (L) - Transudative  . Atrial fibrillation     07/2011 - amio and Rivaroxaban initiated - tee/dccv  . Diastolic CHF, chronic     07/2011 NL EF by echo  . Orthostatic hypotension   . Coronary artery disease 01/19/2012  . Cardiac pacemaker in situ 06/01/2012  . Unspecified hypothyroidism 09/24/2011  . Anxiety state, unspecified 09/24/2011  . Unspecified cataract 02/04/2011  . Disturbance of salivary secretion 07/25/2009  . Edema 09/05/2008  . Shortness of breath 09/05/2008  . 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  Insomnia, unspecified 05/09/2005  . Urinary frequency 05/09/2005  . Malignant neoplasm of prostate 03/04/2005  . Hypertrophy of prostate without urinary obstruction and other lower urinary tract symptoms (LUTS) 02/12/2005  . Other malaise and fatigue 02/12/2005  . Nocturia 02/22/2003  . Disturbances of sensation of smell and taste 03/16/2001  . Other specified cardiac dysrhythmias(427.89) 11/19/1999  . Anemia, unspecified   . Deaf   . Tremor 10/29/2013  . Memory loss 10/29/2013   Past Surgical History  Procedure Laterality Date  . Tee without cardioversion  07/17/2011    Procedure: TRANSESOPHAGEAL ECHOCARDIOGRAM (TEE);  Surgeon: Lelon Perla, MD;  Location: [facility] ENDOSCOPY;  Service: Cardiovascular;  Laterality: N/A;  With Cardioversion  . Hemorroidectomy  1968  . Spine surgery  1993    HNP(RUPTURED DISC)[name]  . Prostate surgery  05/26/2005    Dr. Terance Hart  . Eye surgery  2008    left eye ( Dr. Charise Killian)  . Esophagogastroduodenoscopy N/A 04/15/2013    Procedure: ESOPHAGOGASTRODUODENOSCOPY (EGD);  Surgeon: Cleotis Nipper, MD;  Location: [facility] ENDOSCOPY;  Service: Endoscopy;  Laterality: N/A;  . Esophagogastroduodenoscopy N/A 04/18/2013    Procedure: ESOPHAGOGASTRODUODENOSCOPY (EGD);  Surgeon: Wonda Horner, MD;  Location: [facility] ENDOSCOPY;  Service: Endoscopy;  Laterality: N/A;   Family History  Problem Relation Age of Onset  . Cancer Mother   . Heart disease Father   .  COPD Brother   . Heart disease Son    History  Substance Use Topics  . Smoking status: Never Smoker   . Smokeless tobacco: Never Used  . Alcohol Use: No    Review of Systems  Constitutional: Negative for activity change and appetite change.  Respiratory: Positive for shortness of breath. Negative for cough.   Cardiovascular: Negative for chest pain.  Gastrointestinal: Negative for abdominal pain.  Genitourinary: Negative for dysuria.      Allergies  Mold extract  Home Medications   Prior to  Admission medications   Medication Sig Start Date End Date Taking? Authorizing Provider  amiodarone (PACERONE) 200 MG tablet Take one tablet by mouth once daily 04/05/14  Yes Mahima Pandey, MD  amLODipine (NORVASC) 10 MG tablet Take one tablet by mouth once daily for blood pressure 04/05/14  Yes Mahima Pandey, MD  atorvastatin (LIPITOR) 20 MG tablet Take one tablet by mouth once daily for cholesterol 04/04/14  Yes Mahima Pandey, MD  carvedilol (COREG) 25 MG tablet Take two tablets by mouth every 12 hours for blood pressure 04/04/14  Yes Mahima Pandey, MD  Cyanocobalamin (VITAMIN B-12 PO) Take 1 tablet by mouth every evening.   Yes Historical Provider, MD  doxazosin (CARDURA) 2 MG tablet One daily to reduce urinary frequency Patient taking differently: Take 2 mg by mouth daily.  02/02/14  Yes Estill Dooms, MD  ferrous sulfate 325 (65 FE) MG tablet One daily to treat anemia Patient taking differently: Take 325 mg by mouth daily with breakfast.  07/19/13  Yes Estill Dooms, MD  furosemide (LASIX) 40 MG tablet Take 40 mg by mouth daily.   Yes Historical Provider, MD  levothyroxine (SYNTHROID, LEVOTHROID) 100 MCG tablet Take one tablet by mouth once daily for thyroid Patient taking differently: Take 100 mcg by mouth daily before breakfast.  04/04/14  Yes Mahima Pandey, MD  Omega-3 Fatty Acids (FISH OIL PO) Take 1 tablet by mouth every evening.   Yes Historical Provider, MD  pantoprazole (PROTONIX) 40 MG tablet Take one tablet by mouth once daily for stomach Patient taking differently: Take 40 mg by mouth daily.  04/04/14  Yes Mahima Pandey, MD  potassium chloride SA (K-DUR,KLOR-CON) 20 MEQ tablet Take 20 mEq by mouth daily.   Yes Historical Provider, MD  senna (SENOKOT) 8.6 MG TABS tablet Take 2 tablets by mouth at bedtime.   Yes Historical Provider, MD  spironolactone (ALDACTONE) 25 MG tablet Take one tablet by mouth nightly Patient taking differently: Take 25 mg by mouth daily.  04/04/14  Yes Mahima  Bubba Camp, MD  warfarin (COUMADIN) 4 MG tablet Take one tablet by mouth once daily for anticoagulation Patient taking differently: Take 4 mg by mouth every evening. Take one tablet by mouth once daily for anticoagulation 01/19/14  Yes Lauree Chandler, NP  ALPRAZolam (XANAX) 0.25 MG tablet TAKE 1 TABLET BY MOUTH UP TO 3 TIMES DAILY AS NEEDED FOR NERVES 04/17/14   Estill Dooms, MD  bisacodyl (DULCOLAX) 5 MG EC tablet Take 5 mg by mouth daily.    Historical Provider, MD  cyanocobalamin 100 MCG tablet Take 100 mcg by mouth daily.    Historical Provider, MD  furosemide (LASIX) 40 MG tablet TAKE 1 TABLET DAILY TO CONTROL EDEMA 05/12/14   Estill Dooms, MD  KLOR-CON M20 20 MEQ tablet TAKE 1 TABLET BY MOUTH DAILY. 05/16/14   Estill Dooms, MD  Omega-3 Fatty Acids (FISH OIL) 1000 MG CAPS Take by mouth 2 (two)  times daily.    Historical Provider, MD  warfarin (COUMADIN) 3 MG tablet TAKE 1 TABLET EVERY DAY 05/12/14   Estill Dooms, MD  zolpidem (AMBIEN) 10 MG tablet TAKE 1 TABLET BY MOUTH AT BEDTIME AS NEEDED FOR SLEEP 04/28/14   Tiffany L Reed, DO   BP 128/51 mmHg  Pulse 56  Temp(Src) 98.1 F (36.7 C) (Oral)  Resp 12  Ht 6\' 2"  (1.88 m)  Wt 218 lb (98.884 kg)  BMI 27.98 kg/m2  SpO2 94% Physical Exam  Constitutional: He is oriented to person, place, and time. He appears well-developed.  HENT:  Head: Normocephalic and atraumatic.  Eyes: Conjunctivae and EOM are normal. Pupils are equal, round, and reactive to light.  Neck: Normal range of motion. Neck supple.  Cardiovascular: Normal rate, regular rhythm and intact distal pulses.   Murmur heard. Pulmonary/Chest: Effort normal and breath sounds normal. No respiratory distress. He has no wheezes. He has no rales.  Abdominal: Soft. Bowel sounds are normal. He exhibits no distension. There is no tenderness. There is no rebound and no guarding.  Neurological: He is alert and oriented to person, place, and time.  Skin: Skin is warm.  Nursing note and  vitals reviewed.   ED Course  Procedures (including critical care time) Labs Review Labs Reviewed  BASIC METABOLIC PANEL - Abnormal; Notable for the following:    Glucose, Bld 103 (*)    BUN 31 (*)    Creatinine, Ser 1.97 (*)    GFR calc non Af Amer 29 (*)    GFR calc Af Amer 33 (*)    All other components within normal limits  PRO B NATRIURETIC PEPTIDE - Abnormal; Notable for the following:    Pro B Natriuretic peptide (BNP) 501.7 (*)    All other components within normal limits  PROTIME-INR - Abnormal; Notable for the following:    Prothrombin Time 20.6 (*)    INR 1.75 (*)    All other components within normal limits  CBC WITH DIFFERENTIAL - Abnormal; Notable for the following:    Platelets 115 (*)    Monocytes Relative 13 (*)    All other components within normal limits  TROPONIN I  I-STAT TROPOININ, ED    Imaging Review Dg Chest Port 1 View  05/19/2014   CLINICAL DATA:  Short of breath  EXAM: PORTABLE CHEST - 1 VIEW  COMPARISON:  09/24/2011  FINDINGS: Dual lead pacemaker is in place with leads in the region of the right atrium and right ventricle. The heart is enlarged with left ventricular prominence. The lungs are clear. The pulmonary vascularity is normal. Stent device in the region the proximal aorta is noted.  IMPRESSION: Pacemaker. Aortic region stent device. Left ventricular prominence. No active process evident.   Electronically Signed   By: Nelson Chimes M.D.   On: 05/19/2014 02:28     EKG Interpretation   Date/Time:  Friday May 19 2014 01:53:05 EST Ventricular Rate:  67 PR Interval:  58 QRS Duration: 195 QT Interval:  512 QTC Calculation: 541 R Axis:   -7 Text Interpretation:  Sinus rhythm Short PR interval Left bundle branch  block Unchanged Confirmed by Larah Kuntzman, MD, Micha Dosanjh ([id number]) on 05/19/2014  2:02:38 AM      MDM   Final diagnoses:  Dyspnea    Pt comes in with cc of shortness of breath. Pt arrived to the ER in no resp distress, and with  normal O2 sats. He reports aving almost orthopnea and PND like  sx, and has hx of AS, with a pansystolic murmur. Pt has no jvd, no hx of PE, DVT and no signs of dvt or pitting edema on exam. Pt has pacemaker in place, EKG is unchanged. The CXR and labs are reassuring.  Pt ambulated, with pulsox, and his sats remained constant. Will d.c. Pt lives at assisted living. With hx TAVR hx, we have advised that he gets a prompt f/u regardless. Return precautions discussed.    Varney Biles, MD 05/19/14 [phone]

## 2014-05-19 NOTE — ED Notes (Signed)
Pt [facility] with sob that began earlier 94

## 2014-05-19 NOTE — Telephone Encounter (Signed)
Protime Results from [facility] 05/18/2014  INR:  1.8  Per Dr. Reed---Continue 3mg  daily and recheck in 1 week Patient wife notified and agreed. Faxed to [facility]

## 2014-05-19 NOTE — ED Notes (Signed)
Family in route to pick up pt

## 2014-05-19 NOTE — ED Notes (Signed)
Stats remain in the upper ninety's on room air while ambulating

## 2014-05-19 NOTE — Discharge Instructions (Signed)
We saw you in the ER for the shortness of breath. All the results in the ER are normal, labs and imaging. We are not sure what is causing your symptoms. Given that you have history of aortic valve replacement, we would recommend a quick follow up with Cardiologist.  Return to the ER if the symptoms get worse.   Shortness of Breath Shortness of breath means you have trouble breathing. It could also mean that you have a medical problem. You should get immediate medical care for shortness of breath. CAUSES   Not enough oxygen in the air such as with high altitudes or a smoke-filled room.  Certain lung diseases, infections, or problems.  Heart disease or conditions, such as angina or heart failure.  Low red blood cells (anemia).  Poor physical fitness, which can cause shortness of breath when you exercise.  Chest or back injuries or stiffness.  Being overweight.  Smoking.  Anxiety, which can make you feel like you are not getting enough air. DIAGNOSIS  Serious medical problems can often be found during your physical exam. Tests may also be done to determine why you are having shortness of breath. Tests may include:  Chest X-rays.  Lung function tests.  Blood tests.  An electrocardiogram (ECG).  An ambulatory electrocardiogram. An ambulatory ECG records your heartbeat patterns over a 24-hour period.  Exercise testing.  A transthoracic echocardiogram (TTE). During echocardiography, sound waves are used to evaluate how blood flows through your heart.  A transesophageal echocardiogram (TEE).  Imaging scans. Your health care provider may not be able to find a cause for your shortness of breath after your exam. In this case, it is important to have a follow-up exam with your health care provider as directed.  TREATMENT  Treatment for shortness of breath depends on the cause of your symptoms and can vary greatly. HOME CARE INSTRUCTIONS   Do not smoke. Smoking is a common  cause of shortness of breath. If you smoke, ask for help to quit.  Avoid being around chemicals or things that may bother your breathing, such as paint fumes and dust.  Rest as needed. Slowly resume your usual activities.  If medicines were prescribed, take them as directed for the full length of time directed. This includes oxygen and any inhaled medicines.  Keep all follow-up appointments as directed by your health care provider. SEEK MEDICAL CARE IF:   Your condition does not improve in the time expected.  You have a hard time doing your normal activities even with rest.  You have any new symptoms. SEEK IMMEDIATE MEDICAL CARE IF:   Your shortness of breath gets worse.  You feel light-headed, faint, or develop a cough not controlled with medicines.  You start coughing up blood.  You have pain with breathing.  You have chest pain or pain in your arms, shoulders, or abdomen.  You have a fever.  You are unable to walk up stairs or exercise the way you normally do. MAKE SURE YOU:  Understand these instructions.  Will watch your condition.  Will get help right away if you are not doing well or get worse. Document Released: 03/25/2001 Document Revised: 07/05/2013 Document Reviewed: 09/15/2011 [facility] Patient Information 2015 [place], [place]. This information is not intended to replace advice given to you by your health care provider. Make sure you discuss any questions you have with your health care provider.

## 2014-05-25 ENCOUNTER — Other Ambulatory Visit: Payer: Self-pay | Admitting: Internal Medicine

## 2014-05-25 DIAGNOSIS — I4891 Unspecified atrial fibrillation: Secondary | ICD-10-CM

## 2014-05-25 MED ORDER — APIXABAN 2.5 MG PO TABS: ORAL_TABLET | ORAL | Status: DC

## 2014-05-29 ENCOUNTER — Encounter: Payer: Self-pay | Admitting: Physician Assistant

## 2014-05-29 ENCOUNTER — Ambulatory Visit (INDEPENDENT_AMBULATORY_CARE_PROVIDER_SITE_OTHER): Payer: Medicare Other | Admitting: Physician Assistant

## 2014-05-29 VITALS — BP 140/76 | HR 72 | Ht 74.0 in | Wt 224.1 lb

## 2014-05-29 DIAGNOSIS — I350 Nonrheumatic aortic (valve) stenosis: Secondary | ICD-10-CM

## 2014-05-29 DIAGNOSIS — I2510 Atherosclerotic heart disease of native coronary artery without angina pectoris: Secondary | ICD-10-CM

## 2014-05-29 DIAGNOSIS — I10 Essential (primary) hypertension: Secondary | ICD-10-CM

## 2014-05-29 DIAGNOSIS — I5032 Chronic diastolic (congestive) heart failure: Secondary | ICD-10-CM

## 2014-05-29 DIAGNOSIS — I442 Atrioventricular block, complete: Secondary | ICD-10-CM

## 2014-05-29 DIAGNOSIS — I4891 Unspecified atrial fibrillation: Secondary | ICD-10-CM

## 2014-05-29 DIAGNOSIS — I359 Nonrheumatic aortic valve disorder, unspecified: Secondary | ICD-10-CM

## 2014-05-29 NOTE — Progress Notes (Signed)
Barry Thompson is an 80 year old male patient of Dr.Nishan who has history of AS status post recent corevalve 29 mm Tavor done at [facility]. This was complicated by heart block and had a pacer placed.Prior to [place] cath was done that showed moderate diseasenot needing intervention. He had moderate stenosis of the mid LAD, severe stenosis of the left circumflex OM subbranch, and nonobstructive disease in the RCA, moderate severe AS with elevated left ventricular filling pressures. Patient also has a history of GI bleed back in 04/2013. Patient was on Coumadin, but fell against his wife's walker and has a huge hematoma in his abdomen. Dr. Nyoka Cowden switched him to Eliquis recently.  Patient had a recent emergency room visit for dyspnea. He had gone to his wife's exercise class and overdid it. All his blood work checked out at the emergency room and he was sent home without any changes. He denies any dyspnea, dyspnea on exertion, chest pain, dizziness or presyncope since then. Prior to the emergency room visit he had 2 or 3 nights of night sweats as well. This has not returned. BNP in the ER was 501 creatinine was elevated at 1.97. The patient has felt better since he is not going to his wife's  exercise classes anymore.  Allergies  Allergen Reactions  . Mold Extract [Trichophyton]      Current Outpatient Prescriptions  Medication Sig Dispense Refill  . ALPRAZolam (XANAX) 0.25 MG tablet TAKE 1 TABLET BY MOUTH UP TO 3 TIMES DAILY AS NEEDED FOR NERVES 100 tablet 0  . amiodarone (PACERONE) 200 MG tablet Take one tablet by mouth once daily 90 tablet 3  . amLODipine (NORVASC) 10 MG tablet Take one tablet by mouth once daily for blood pressure 90 tablet 3  . apixaban (ELIQUIS) 2.5 MG TABS tablet One twice daily for anticoagulation 60 tablet 5  . atorvastatin (LIPITOR) 20 MG tablet Take one tablet by mouth once daily for cholesterol 90 tablet 3  . bisacodyl (DULCOLAX) 5 MG EC tablet Take 5 mg by mouth daily.     . carvedilol (COREG) 25 MG tablet Take two tablets by mouth every 12 hours for blood pressure 360 tablet 3  . Cyanocobalamin (VITAMIN B-12 PO) Take 1 tablet by mouth every evening.    . cyanocobalamin 100 MCG tablet Take 100 mcg by mouth daily.    Marland Kitchen doxazosin (CARDURA) 2 MG tablet One daily to reduce urinary frequency (Patient taking differently: Take 2 mg by mouth daily. ) 90 tablet 3  . ferrous sulfate 325 (65 FE) MG tablet One daily to treat anemia (Patient taking differently: Take 325 mg by mouth daily with breakfast. ) 100 tablet 3  . furosemide (LASIX) 40 MG tablet TAKE 1 TABLET DAILY TO CONTROL EDEMA 30 tablet 11  . furosemide (LASIX) 40 MG tablet Take 40 mg by mouth daily.    Marland Kitchen KLOR-CON M20 20 MEQ tablet TAKE 1 TABLET BY MOUTH DAILY. 90 tablet 0  . levothyroxine (SYNTHROID, LEVOTHROID) 100 MCG tablet Take one tablet by mouth once daily for thyroid (Patient taking differently: Take 100 mcg by mouth daily before breakfast. ) 90 tablet 3  . Omega-3 Fatty Acids (FISH OIL PO) Take 1 tablet by mouth every evening.    . Omega-3 Fatty Acids (FISH OIL) 1000 MG CAPS Take by mouth 2 (two) times daily.    . pantoprazole (PROTONIX) 40 MG tablet Take one tablet by mouth once daily for stomach (Patient taking differently: Take 40 mg by mouth  daily. ) 90 tablet 3  . potassium chloride SA (K-DUR,KLOR-CON) 20 MEQ tablet Take 20 mEq by mouth daily.    Marland Kitchen senna (SENOKOT) 8.6 MG TABS tablet Take 2 tablets by mouth at bedtime.    Marland Kitchen spironolactone (ALDACTONE) 25 MG tablet Take one tablet by mouth nightly (Patient taking differently: Take 25 mg by mouth daily. ) 90 tablet 3  . zolpidem (AMBIEN) 10 MG tablet TAKE 1 TABLET BY MOUTH AT BEDTIME AS NEEDED FOR SLEEP 30 tablet 0   No current facility-administered medications for this visit.    Past Medical History  Diagnosis Date  . Hypertension   . Hyperlipidemia   . Aortic stenosis     Moderately Severe by echo 07/2011  . 1St degree AV block   . Shortness of  breath   . Angina   . Chronic kidney disease     renal insufficiency  . Pleural effusion     07/2011 - Left > Right - tapped (L) - Transudative  . Atrial fibrillation     07/2011 - amio and Rivaroxaban initiated - tee/dccv  . Diastolic CHF, chronic     07/2011 NL EF by echo  . Orthostatic hypotension   . Coronary artery disease 01/19/2012  . Cardiac pacemaker in situ 06/01/2012  . Unspecified hypothyroidism 09/24/2011  . Anxiety state, unspecified 09/24/2011  . Unspecified cataract 02/04/2011  . Disturbance of salivary secretion 07/25/2009  . Edema 09/05/2008  . Shortness of breath 09/05/2008  . 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 Insomnia, unspecified 05/09/2005  . Urinary frequency 05/09/2005  . Malignant neoplasm of prostate 03/04/2005  . Hypertrophy of prostate without urinary obstruction and other lower urinary tract symptoms (LUTS) 02/12/2005  . Other malaise and fatigue 02/12/2005  . Nocturia 02/22/2003  . Disturbances of sensation of smell and taste 03/16/2001  . Other specified cardiac dysrhythmias(427.89) 11/19/1999  . Anemia, unspecified   . Deaf   . Tremor 10/29/2013  . Memory loss 10/29/2013    Past Surgical History  Procedure Laterality Date  . Tee without cardioversion  07/17/2011    Procedure: TRANSESOPHAGEAL ECHOCARDIOGRAM (TEE);  Surgeon: Lelon Perla, MD;  Location: [facility] ENDOSCOPY;  Service: Cardiovascular;  Laterality: N/A;  With Cardioversion  . Hemorroidectomy  1968  . Spine surgery  1993    HNP(RUPTURED DISC)[name]  . Prostate surgery  05/26/2005    Dr. Terance Hart  . Eye surgery  2008    left eye ( Dr. Charise Killian)  . Esophagogastroduodenoscopy N/A 04/15/2013    Procedure: ESOPHAGOGASTRODUODENOSCOPY (EGD);  Surgeon: Cleotis Nipper, MD;  Location: [facility] ENDOSCOPY;  Service: Endoscopy;  Laterality: N/A;  . Esophagogastroduodenoscopy N/A 04/18/2013    Procedure: ESOPHAGOGASTRODUODENOSCOPY (EGD);  Surgeon: Wonda Horner, MD;  Location: [facility] ENDOSCOPY;   Service: Endoscopy;  Laterality: N/A;    Family History  Problem Relation Age of Onset  . Cancer Mother   . Heart disease Father   . COPD Brother   . Heart disease Son     History   Social History  . Marital Status: Married    Spouse Name: N/A    Number of Children: N/A  . Years of Education: N/A   Occupational History  . Not on file.   Social History Main Topics  . Smoking status: Never Smoker   . Smokeless tobacco: Never Used  . Alcohol Use: No  . Drug Use: No  . Sexual Activity: No   Other Topics Concern  . Not on file  Social History Narrative   Lives at [place] since 08/26/13   Married Romie Minus   Exercise walking daily with cane   Never smoke   Hard of hearing             NAT:FTDD of hearing,see history of present illness otherwise negative  BP 140/76 mmHg  Pulse 72  Ht 6\' 2"  (1.88 m)  Wt 224 lb 1.9 oz (101.66 kg)  BMI 28.76 kg/m2  PHYSICAL EXAM: Well-nournished, in no acute distress. Neck: No JVD, HJR, Bruit, or thyroid enlargement  Lungs: No tachypnea, clear without wheezing, rales, or rhonchi  Cardiovascular: RRR, PMI not displaced, Normal S1 and S2, no murmurs, gallops, bruit, thrill, or heave.  Abdomen:large hematoma in the lower left and mid abdominal region with excessive bruising, resolving. BS normal. Soft without organomegaly, masses, lesions or tenderness.  Extremities: without cyanosis, clubbing or edema. Good distal pulses bilateral  SKin: Warm, no lesions or rashes   Musculoskeletal: No deformities  Neuro: no focal signs   Wt Readings from Last 3 Encounters:  05/19/14 218 lb (98.884 kg)  04/13/14 218 lb (98.884 kg)  02/07/14 220 lb (99.791 kg)    Lab Results  Component Value Date   WBC 4.9 05/19/2014   HGB 14.4 05/19/2014   HCT 42.6 05/19/2014   PLT 115* 05/19/2014   GLUCOSE 103* 05/19/2014   CHOL 130 01/24/2014   TRIG 105 01/24/2014   HDL 50 01/24/2014   LDLCALC 59 01/24/2014   ALT 41* 01/24/2014    AST 29 01/24/2014   NA 138 05/19/2014   K 4.8 05/19/2014   CL 99 05/19/2014   CREATININE 1.97* 05/19/2014   BUN 31* 05/19/2014   CO2 25 05/19/2014   TSH 4.70 04/06/2014   INR 1.75* 05/19/2014    EKG:AV paced rhythm

## 2014-05-29 NOTE — Assessment & Plan Note (Signed)
Blood pressure stable ? ?

## 2014-05-29 NOTE — Assessment & Plan Note (Signed)
Status post corevalve at [facility]. Follow-up 2-D echo in 1 year with Dr.Nishan

## 2014-05-29 NOTE — Assessment & Plan Note (Signed)
Pacemaker follow-up in January with Dr. Caryl Comes

## 2014-05-29 NOTE — Assessment & Plan Note (Signed)
Patient had a recent emergency room visit for dyspnea after overdoing it and his wife's exercise class. There is no evidence of heart failure on exam today. Continue current treatment. Follow-up with Dr. Johnsie Cancel in 1 year with a 2-D echo.

## 2014-05-29 NOTE — Assessment & Plan Note (Signed)
Rate controlled. Coumadin stopped because of recent fall and large abdominal hematoma. He is now on Elliquis.

## 2014-05-29 NOTE — Assessment & Plan Note (Signed)
Stable without chest pain 

## 2014-05-29 NOTE — Patient Instructions (Addendum)
Your physician recommends that you continue on your current medications as directed. Please refer to the Current Medication list given to you today.    Your physician recommends that you schedule a follow-up appointment in: WITH DR [place] 2 MONTHS    Your physician has requested that you have an echocardiogram. Echocardiography is a painless test that uses sound waves to create images of your heart. It provides your doctor with information about the size and shape of your heart and how well your heart's chambers and valves are working. This procedure takes approximately one hour. There are no restrictions for this procedure.

## 2014-05-30 ENCOUNTER — Other Ambulatory Visit: Payer: Self-pay | Admitting: *Deleted

## 2014-05-30 MED ORDER — ZOLPIDEM TARTRATE 10 MG PO TABS: 10.0000 mg | ORAL_TABLET | Freq: Every evening | ORAL | Status: DC | PRN

## 2014-05-30 NOTE — Telephone Encounter (Signed)
[facility]  

## 2014-06-01 ENCOUNTER — Telehealth: Payer: Self-pay | Admitting: *Deleted

## 2014-06-01 ENCOUNTER — Ambulatory Visit (INDEPENDENT_AMBULATORY_CARE_PROVIDER_SITE_OTHER): Payer: Medicare Other | Admitting: Emergency Medicine

## 2014-06-01 ENCOUNTER — Other Ambulatory Visit: Payer: Self-pay | Admitting: *Deleted

## 2014-06-01 VITALS — BP 154/82 | HR 107 | Temp 97.6°F | Resp 20 | Ht 71.0 in | Wt 223.0 lb

## 2014-06-01 DIAGNOSIS — I2510 Atherosclerotic heart disease of native coronary artery without angina pectoris: Secondary | ICD-10-CM

## 2014-06-01 DIAGNOSIS — I4891 Unspecified atrial fibrillation: Secondary | ICD-10-CM

## 2014-06-01 DIAGNOSIS — R61 Generalized hyperhidrosis: Secondary | ICD-10-CM

## 2014-06-01 LAB — POCT UA - MICROSCOPIC ONLY
Bacteria, U Microscopic: NEGATIVE
Casts, Ur, LPF, POC: NEGATIVE
Crystals, Ur, HPF, POC: NEGATIVE
MUCUS UA: NEGATIVE
Yeast, UA: NEGATIVE

## 2014-06-01 LAB — CBC WITH DIFFERENTIAL/PLATELET
Basophils Absolute: 0.1 10*3/uL (ref 0.0–0.1)
Basophils Relative: 1 % (ref 0–1)
EOS ABS: 0.1 10*3/uL (ref 0.0–0.7)
EOS PCT: 1 % (ref 0–5)
HEMATOCRIT: 45.7 % (ref 39.0–52.0)
Hemoglobin: 15.4 g/dL (ref 13.0–17.0)
Lymphocytes Relative: 23 % (ref 12–46)
Lymphs Abs: 1.2 10*3/uL (ref 0.7–4.0)
MCH: 29.7 pg (ref 26.0–34.0)
MCHC: 33.7 g/dL (ref 30.0–36.0)
MCV: 88.1 fL (ref 78.0–100.0)
MONOS PCT: 11 % (ref 3–12)
MPV: 8.8 fL — ABNORMAL LOW (ref 9.4–12.4)
Monocytes Absolute: 0.6 10*3/uL (ref 0.1–1.0)
NEUTROS PCT: 64 % (ref 43–77)
Neutro Abs: 3.5 10*3/uL (ref 1.7–7.7)
Platelets: 198 10*3/uL (ref 150–400)
RBC: 5.19 MIL/uL (ref 4.22–5.81)
RDW: 14.6 % (ref 11.5–15.5)
WBC: 5.4 10*3/uL (ref 4.0–10.5)

## 2014-06-01 LAB — POCT URINALYSIS DIPSTICK
Bilirubin, UA: NEGATIVE
Glucose, UA: NEGATIVE
KETONES UA: NEGATIVE
Leukocytes, UA: NEGATIVE
Nitrite, UA: NEGATIVE
PH UA: 5
Protein, UA: NEGATIVE
SPEC GRAV UA: 1.015
Urobilinogen, UA: 0.2

## 2014-06-01 MED ORDER — APIXABAN 2.5 MG PO TABS: ORAL_TABLET | ORAL | Status: DC

## 2014-06-01 NOTE — Progress Notes (Signed)
[facility] [address], [place] [address] [phone]  Date:  06/01/2014   Name:  Barry Thompson   DOB:  31-Jan-1924   MRN:  [id number]  PCP:  Estill Dooms, MD    Chief Complaint: pt states his back was "hot" last night   History of Present Illness:  Barry Thompson is a 80 y.o. very pleasant male patient who presents with the following:  Says he was well when he went to bed last night.  He felt his back was "hot" and that forced him to relocate to a recliner where he was treated by his wife with bengay She also applied ice packs to his back and he went to sleep. Says this is the second time this has happened to him He has no headache, coryza, sore throat, nasal congestion or drainage.  No nasal driange No cough, wheezing or shortness of breath.   No chest pain, tightness. No nausea or vomiting.  No stool change.  No rash No fever or chills No dysuria, urgency or frequency. No rash No improvement with over the counter medications or other home remedies.  Denies other complaint or health concern today..  Patient Active Problem List   Diagnosis Date Noted  . Hypothyroidism 04/13/2014  . Aortic valve disorder 02/02/2014  . Tremor 10/29/2013  . Memory loss 10/29/2013  . Deaf   . Encounter for therapeutic drug monitoring 08/08/2013  . Anemia due to chronic blood loss 04/14/2013  . Chronic kidney disease, stage IV (severe) 04/13/2013  . Chronic diastolic congestive heart failure 04/13/2013  . Nocturia 03/10/2013  . Urine frequency 03/10/2013  . S/P aortic valve replacement 03/10/2013  . Pacemaker-Medtronic 08/16/2012  . Artificial cardiac pacemaker 07/13/2012  . CAD (coronary artery disease) 03/30/2012  . Pericardial effusion 07/19/2011  . Atrial fibrillation   07/14/2011  . HTN (hypertension) 05/22/2011  . Mitral valve disease 05/22/2011  . Mixed hyperlipidemia 05/22/2011  . Atrioventricular block, complete  assoc w TAVR 05/22/2011  .  Malignant neoplasm of prostate 05/26/2005    Past Medical History  Diagnosis Date  . Hypertension   . Hyperlipidemia   . Aortic stenosis     Moderately Severe by echo 07/2011  . 1St degree AV block   . Shortness of breath   . Angina   . Chronic kidney disease     renal insufficiency  . Pleural effusion     07/2011 - Left > Right - tapped (L) - Transudative  . Atrial fibrillation     07/2011 - amio and Rivaroxaban initiated - tee/dccv  . Diastolic CHF, chronic     07/2011 NL EF by echo  . Orthostatic hypotension   . Coronary artery disease 01/19/2012  . Cardiac pacemaker in situ 06/01/2012  . Unspecified hypothyroidism 09/24/2011  . Anxiety state, unspecified 09/24/2011  . Unspecified cataract 02/04/2011  . Disturbance of salivary secretion 07/25/2009  . Edema 09/05/2008  . Shortness of breath 09/05/2008  . 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 Insomnia, unspecified 05/09/2005  . Urinary frequency 05/09/2005  . Malignant neoplasm of prostate 03/04/2005  . Hypertrophy of prostate without urinary obstruction and other lower urinary tract symptoms (LUTS) 02/12/2005  . Other malaise and fatigue 02/12/2005  . Nocturia 02/22/2003  . Disturbances of sensation of smell and taste 03/16/2001  . Other specified cardiac dysrhythmias(427.89) 11/19/1999  . Anemia, unspecified   . Deaf   . Tremor 10/29/2013  . Memory loss 10/29/2013  Past Surgical History  Procedure Laterality Date  . Tee without cardioversion  07/17/2011    Procedure: TRANSESOPHAGEAL ECHOCARDIOGRAM (TEE);  Surgeon: Lelon Perla, MD;  Location: [facility] ENDOSCOPY;  Service: Cardiovascular;  Laterality: N/A;  With Cardioversion  . Hemorroidectomy  1968  . Spine surgery  1993    HNP(RUPTURED DISC)[name]  . Prostate surgery  05/26/2005    Dr. Terance Hart  . Eye surgery  2008    left eye ( Dr. Charise Killian)  . Esophagogastroduodenoscopy N/A 04/15/2013    Procedure: ESOPHAGOGASTRODUODENOSCOPY (EGD);  Surgeon: Cleotis Nipper, MD;  Location: [facility] ENDOSCOPY;  Service: Endoscopy;  Laterality: N/A;  . Esophagogastroduodenoscopy N/A 04/18/2013    Procedure: ESOPHAGOGASTRODUODENOSCOPY (EGD);  Surgeon: Wonda Horner, MD;  Location: [facility] ENDOSCOPY;  Service: Endoscopy;  Laterality: N/A;    History  Substance Use Topics  . Smoking status: Never Smoker   . Smokeless tobacco: Never Used  . Alcohol Use: No    Family History  Problem Relation Age of Onset  . Cancer Mother   . Heart disease Father   . COPD Brother   . Heart disease Son     Allergies  Allergen Reactions  . Mold Extract [Trichophyton]     Medication list has been reviewed and updated.  Current Outpatient Prescriptions on File Prior to Visit  Medication Sig Dispense Refill  . ALPRAZolam (XANAX) 0.25 MG tablet TAKE 1 TABLET BY MOUTH UP TO 3 TIMES DAILY AS NEEDED FOR NERVES 100 tablet 0  . amiodarone (PACERONE) 200 MG tablet Take one tablet by mouth once daily 90 tablet 3  . amLODipine (NORVASC) 10 MG tablet Take one tablet by mouth once daily for blood pressure 90 tablet 3  . atorvastatin (LIPITOR) 20 MG tablet Take one tablet by mouth once daily for cholesterol 90 tablet 3  . bisacodyl (DULCOLAX) 5 MG EC tablet Take 5 mg by mouth daily.    . carvedilol (COREG) 25 MG tablet Take two tablets by mouth every 12 hours for blood pressure 360 tablet 3  . Cyanocobalamin (VITAMIN B-12 PO) Take 1 tablet by mouth every evening.    . cyanocobalamin 100 MCG tablet Take 100 mcg by mouth daily.    Marland Kitchen doxazosin (CARDURA) 2 MG tablet One daily to reduce urinary frequency (Patient taking differently: Take 2 mg by mouth daily. ) 90 tablet 3  . ferrous sulfate 325 (65 FE) MG tablet One daily to treat anemia (Patient taking differently: Take 325 mg by mouth daily with breakfast. ) 100 tablet 3  . furosemide (LASIX) 40 MG tablet TAKE 1 TABLET DAILY TO CONTROL EDEMA 30 tablet 11  . furosemide (LASIX) 40 MG tablet Take 40 mg by mouth daily.    Marland Kitchen KLOR-CON M20 20 MEQ  tablet TAKE 1 TABLET BY MOUTH DAILY. 90 tablet 0  . levothyroxine (SYNTHROID, LEVOTHROID) 100 MCG tablet Take one tablet by mouth once daily for thyroid (Patient taking differently: Take 100 mcg by mouth daily before breakfast. ) 90 tablet 3  . Omega-3 Fatty Acids (FISH OIL PO) Take 1 tablet by mouth every evening.    . Omega-3 Fatty Acids (FISH OIL) 1000 MG CAPS Take by mouth 2 (two) times daily.    . pantoprazole (PROTONIX) 40 MG tablet Take one tablet by mouth once daily for stomach (Patient taking differently: Take 40 mg by mouth daily. ) 90 tablet 3  . potassium chloride SA (K-DUR,KLOR-CON) 20 MEQ tablet Take 20 mEq by mouth daily.    Marland Kitchen  senna (SENOKOT) 8.6 MG TABS tablet Take 2 tablets by mouth at bedtime.    Marland Kitchen spironolactone (ALDACTONE) 25 MG tablet Take one tablet by mouth nightly (Patient taking differently: Take 25 mg by mouth daily. ) 90 tablet 3  . zolpidem (AMBIEN) 10 MG tablet Take 1 tablet (10 mg total) by mouth at bedtime as needed. for sleep 30 tablet 0   No current facility-administered medications on file prior to visit.    Review of Systems:  As per HPI, otherwise negative.    Physical Examination: Filed Vitals:   06/01/14 1025  BP: 154/82  Pulse: 107  Temp: 97.6 F (36.4 C)  Resp: 20   Filed Vitals:   06/01/14 1025  Height: 5\' 11"  (1.803 m)  Weight: 223 lb (101.152 kg)   Body mass index is 31.12 kg/(m^2). Ideal Body Weight: Weight in (lb) to have BMI = 25: 178.9  GEN: WDWN, NAD, Non-toxic, A & O x 3 HEENT: Atraumatic, Normocephalic. Neck supple. No masses, No LAD. Ears and Nose: No external deformity. CV: RRR, No M/G/R. No JVD. No thrill. No extra heart sounds. PULM: CTA B, no wheezes, crackles, rhonchi. No retractions. No resp. distress. No accessory muscle use. ABD: S, NT, ND, +BS. No rebound. No HSM. EXTR: No c/c/e NEURO Normal gait.  PSYCH: Normally interactive. Conversant. Not depressed or anxious appearing.  Calm demeanor.    Assessment and  Plan: Night sweat UA  Signed,  Ellison Carwin, MD   Results for orders placed or performed in visit on 06/01/14  POCT urinalysis dipstick  Result Value Ref Range   Color, UA yellow    Clarity, UA clear    Glucose, UA neg    Bilirubin, UA neg    Ketones, UA neg    Spec Grav, UA 1.015    Blood, UA trace-intact    pH, UA 5.0    Protein, UA neg    Urobilinogen, UA 0.2    Nitrite, UA neg    Leukocytes, UA Negative   POCT UA - Microscopic Only  Result Value Ref Range   WBC, Ur, HPF, POC 0-1    RBC, urine, microscopic 0-1    Bacteria, U Microscopic neg    Mucus, UA neg    Epithelial cells, urine per micros 0-1    Crystals, Ur, HPF, POC neg    Casts, Ur, LPF, POC neg    Yeast, UA negf

## 2014-06-01 NOTE — Telephone Encounter (Signed)
Patient wife, Romie Minus called and stated that pharmacy has not received Rx. I refaxed to pharmacy

## 2014-06-01 NOTE — Telephone Encounter (Signed)
Initiated Prior Auth for Eliquis 2.5mg  twice daily for anticoagulation. Approved till 07/06/2014. To call back and initiate another one after 30 days.  Member ID [id number] call # [phone]

## 2014-06-20 ENCOUNTER — Other Ambulatory Visit: Payer: Self-pay | Admitting: *Deleted

## 2014-06-20 MED ORDER — POTASSIUM CHLORIDE CRYS ER 20 MEQ PO TBCR: EXTENDED_RELEASE_TABLET | ORAL | Status: DC

## 2014-06-20 NOTE — Telephone Encounter (Signed)
[facility]  

## 2014-06-22 ENCOUNTER — Other Ambulatory Visit: Payer: Self-pay | Admitting: Internal Medicine

## 2014-06-22 ENCOUNTER — Other Ambulatory Visit: Payer: Self-pay | Admitting: *Deleted

## 2014-06-22 MED ORDER — ALPRAZOLAM 0.25 MG PO TABS: ORAL_TABLET | ORAL | Status: DC

## 2014-06-22 NOTE — Telephone Encounter (Signed)
[facility]  

## 2014-06-26 ENCOUNTER — Other Ambulatory Visit: Payer: Self-pay | Admitting: *Deleted

## 2014-06-26 MED ORDER — POTASSIUM CHLORIDE CRYS ER 20 MEQ PO TBCR: 20.0000 meq | EXTENDED_RELEASE_TABLET | Freq: Every day | ORAL | Status: DC

## 2014-06-26 NOTE — Telephone Encounter (Signed)
[facility]  

## 2014-07-10 ENCOUNTER — Ambulatory Visit (INDEPENDENT_AMBULATORY_CARE_PROVIDER_SITE_OTHER): Payer: Medicare Other | Admitting: Family Medicine

## 2014-07-10 VITALS — BP 136/82 | HR 85 | Temp 97.7°F | Resp 18 | Ht 72.25 in | Wt 220.4 lb

## 2014-07-10 DIAGNOSIS — T148XXA Other injury of unspecified body region, initial encounter: Secondary | ICD-10-CM

## 2014-07-10 DIAGNOSIS — IMO0002 Reserved for concepts with insufficient information to code with codable children: Secondary | ICD-10-CM

## 2014-07-10 DIAGNOSIS — I2510 Atherosclerotic heart disease of native coronary artery without angina pectoris: Secondary | ICD-10-CM

## 2014-07-10 DIAGNOSIS — T148 Other injury of unspecified body region: Secondary | ICD-10-CM

## 2014-07-10 MED ORDER — MUPIROCIN 2 % EX OINT: TOPICAL_OINTMENT | CUTANEOUS | Status: DC

## 2014-07-10 NOTE — Progress Notes (Signed)
Chief Complaint:  Chief Complaint  Patient presents with  . Arm Pain    left arm pain; was picking up his wife and scratch his arm on the door    HPI: Barry Thompson is a 80 y.o. male who is here for  Left arm wound x 1 week, he injured his left forearm after helping support his wife after fall, he  scraped it on a door and is on blood thinner, it was bleeding profusely but has now since stopped. Cleaning and using hydoregn peroxide and also batroban. No fevers or chill.   Past Medical History  Diagnosis Date  . Hypertension   . Hyperlipidemia   . Aortic stenosis     Moderately Severe by echo 07/2011  . 1St degree AV block   . Shortness of breath   . Angina   . Chronic kidney disease     renal insufficiency  . Pleural effusion     07/2011 - Left > Right - tapped (L) - Transudative  . Atrial fibrillation     07/2011 - amio and Rivaroxaban initiated - tee/dccv  . Diastolic CHF, chronic     07/2011 NL EF by echo  . Orthostatic hypotension   . Coronary artery disease 01/19/2012  . Cardiac pacemaker in situ 06/01/2012  . Unspecified hypothyroidism 09/24/2011  . Anxiety state, unspecified 09/24/2011  . Unspecified cataract 02/04/2011  . Disturbance of salivary secretion 07/25/2009  . Edema 09/05/2008  . Shortness of breath 09/05/2008  . 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 Insomnia, unspecified 05/09/2005  . Urinary frequency 05/09/2005  . Malignant neoplasm of prostate 03/04/2005  . Hypertrophy of prostate without urinary obstruction and other lower urinary tract symptoms (LUTS) 02/12/2005  . Other malaise and fatigue 02/12/2005  . Nocturia 02/22/2003  . Disturbances of sensation of smell and taste 03/16/2001  . Other specified cardiac dysrhythmias(427.89) 11/19/1999  . Anemia, unspecified   . Deaf   . Tremor 10/29/2013  . Memory loss 10/29/2013   Past Surgical History  Procedure Laterality Date  . Tee without cardioversion  07/17/2011    Procedure:  TRANSESOPHAGEAL ECHOCARDIOGRAM (TEE);  Surgeon: Lelon Perla, MD;  Location: [facility] ENDOSCOPY;  Service: Cardiovascular;  Laterality: N/A;  With Cardioversion  . Hemorroidectomy  1968  . Spine surgery  1993    HNP(RUPTURED DISC)[name]  . Prostate surgery  05/26/2005    Dr. Terance Hart  . Eye surgery  2008    left eye ( Dr. Charise Killian)  . Esophagogastroduodenoscopy N/A 04/15/2013    Procedure: ESOPHAGOGASTRODUODENOSCOPY (EGD);  Surgeon: Cleotis Nipper, MD;  Location: [facility] ENDOSCOPY;  Service: Endoscopy;  Laterality: N/A;  . Esophagogastroduodenoscopy N/A 04/18/2013    Procedure: ESOPHAGOGASTRODUODENOSCOPY (EGD);  Surgeon: Wonda Horner, MD;  Location: [facility] ENDOSCOPY;  Service: Endoscopy;  Laterality: N/A;   History   Social History  . Marital Status: Married    Spouse Name: N/A    Number of Children: N/A  . Years of Education: N/A   Social History Main Topics  . Smoking status: Never Smoker   . Smokeless tobacco: Never Used  . Alcohol Use: No  . Drug Use: No  . Sexual Activity: No   Other Topics Concern  . None   Social History Narrative   Lives at [facility] since 08/26/13   Married Romie Minus   Exercise walking daily with cane   Never smoke   Hard of hearing  Family History  Problem Relation Age of Onset  . Cancer Mother   . Heart disease Father   . COPD Brother   . Heart disease Son    Allergies  Allergen Reactions  . Mold Extract [Trichophyton]    Prior to Admission medications   Medication Sig Start Date End Date Taking? Authorizing Provider  ALPRAZolam Duanne Moron) 0.25 MG tablet Take one tablet by mouth up to three times daily as needed for nerves 06/22/14  Yes Tiffany L Reed, DO  amiodarone (PACERONE) 200 MG tablet Take one tablet by mouth once daily 04/05/14  Yes Mahima Pandey, MD  amLODipine (NORVASC) 10 MG tablet Take one tablet by mouth once daily for blood pressure 04/05/14  Yes Mahima Pandey, MD  apixaban (ELIQUIS) 2.5 MG TABS tablet Take  One tablet by mouth twice daily for anticoagulation 06/01/14  Yes Estill Dooms, MD  atorvastatin (LIPITOR) 20 MG tablet Take one tablet by mouth once daily for cholesterol 04/04/14  Yes Mahima Pandey, MD  bisacodyl (DULCOLAX) 5 MG EC tablet Take 5 mg by mouth daily.   Yes Historical Provider, MD  carvedilol (COREG) 25 MG tablet Take two tablets by mouth every 12 hours for blood pressure 04/04/14  Yes Mahima Pandey, MD  Cyanocobalamin (VITAMIN B-12 PO) Take 1 tablet by mouth every evening.   Yes Historical Provider, MD  cyanocobalamin 100 MCG tablet Take 100 mcg by mouth daily.   Yes Historical Provider, MD  doxazosin (CARDURA) 2 MG tablet One daily to reduce urinary frequency Patient taking differently: Take 2 mg by mouth daily.  02/02/14  Yes Estill Dooms, MD  ferrous sulfate 325 (65 FE) MG tablet One daily to treat anemia Patient taking differently: Take 325 mg by mouth daily with breakfast.  07/19/13  Yes Estill Dooms, MD  furosemide (LASIX) 40 MG tablet TAKE 1 TABLET DAILY TO CONTROL EDEMA 05/12/14  Yes Estill Dooms, MD  furosemide (LASIX) 40 MG tablet Take 40 mg by mouth daily.   Yes Historical Provider, MD  levothyroxine (SYNTHROID, LEVOTHROID) 100 MCG tablet Take one tablet by mouth once daily for thyroid Patient taking differently: Take 100 mcg by mouth daily before breakfast.  04/04/14  Yes Mahima Pandey, MD  Omega-3 Fatty Acids (FISH OIL PO) Take 1 tablet by mouth every evening.   Yes Historical Provider, MD  Omega-3 Fatty Acids (FISH OIL) 1000 MG CAPS Take by mouth 2 (two) times daily.   Yes Historical Provider, MD  pantoprazole (PROTONIX) 40 MG tablet Take one tablet by mouth once daily for stomach Patient taking differently: Take 40 mg by mouth daily.  04/04/14  Yes Mahima Bubba Camp, MD  potassium chloride SA (K-DUR,KLOR-CON) 20 MEQ tablet Take one tablet by mouth once daily for potassium 06/20/14  Yes Tiffany L Reed, DO  potassium chloride SA (KLOR-CON M20) 20 MEQ tablet Take 1 tablet  (20 mEq total) by mouth daily. 06/26/14  Yes Tiffany L Reed, DO  senna (SENOKOT) 8.6 MG TABS tablet Take 2 tablets by mouth at bedtime.   Yes Historical Provider, MD  spironolactone (ALDACTONE) 25 MG tablet Take one tablet by mouth nightly Patient taking differently: Take 25 mg by mouth daily.  04/04/14  Yes Mahima Pandey, MD  zolpidem (AMBIEN) 10 MG tablet TAKE 1 TABLET AT BEDTIME AS NEEDED FOR SLEEP. 06/22/14  Yes Estill Dooms, MD     ROS: The patient denies fevers, chills, night sweats, unintentional weight loss, chest pain, palpitations, wheezing, dyspnea on exertion, nausea, vomiting, abdominal  pain, dysuria, hematuria, melena, numbness, weakness, or tingling.   All other systems have been reviewed and were otherwise negative with the exception of those mentioned in the HPI and as above.    PHYSICAL EXAM: Filed Vitals:   07/10/14 1102  BP: 136/82  Pulse: 85  Temp: 97.7 F (36.5 C)  Resp: 18   Filed Vitals:   07/10/14 1102  Height: 6' 0.25" (1.835 m)  Weight: 220 lb 6.4 oz (99.973 kg)   Body mass index is 29.69 kg/(m^2).  General: Alert, no acute distress HEENT:  Normocephalic, atraumatic, oropharynx patent. EOMI, PERRLA Cardiovascular:  Regular rate and rhythm, no rubs + soft syteolic murmurs no gallops.  No Carotid bruits, radial pulse intact. Respiratory: Clear to auscultation bilaterally.  No wheezes, rales, or rhonchi.  No cyanosis, no use of accessory musculature GI: No organomegaly, abdomen is soft and non-tender, positive bowel sounds.  No masses. Skin: + well healing 1 inch forearm skin tear, no eschar, no e/o of cellulitis or dc, no warmth, nontender Neurologic: Facial musculature symmetric. Psychiatric: Patient is appropriate throughout our interaction. Lymphatic: No cervical lymphadenopathy Musculoskeletal: Gait intact.   LABS: Results for orders placed or performed in visit on 06/01/14  CBC with Differential  Result Value Ref Range   WBC 5.4 4.0 - 10.5  K/uL   RBC 5.19 4.22 - 5.81 MIL/uL   Hemoglobin 15.4 13.0 - 17.0 g/dL   HCT 45.7 39.0 - 52.0 %   MCV 88.1 78.0 - 100.0 fL   MCH 29.7 26.0 - 34.0 pg   MCHC 33.7 30.0 - 36.0 g/dL   RDW 14.6 11.5 - 15.5 %   Platelets 198 150 - 400 K/uL   MPV 8.8 (L) 9.4 - 12.4 fL   Neutrophils Relative % 64 43 - 77 %   Neutro Abs 3.5 1.7 - 7.7 K/uL   Lymphocytes Relative 23 12 - 46 %   Lymphs Abs 1.2 0.7 - 4.0 K/uL   Monocytes Relative 11 3 - 12 %   Monocytes Absolute 0.6 0.1 - 1.0 K/uL   Eosinophils Relative 1 0 - 5 %   Eosinophils Absolute 0.1 0.0 - 0.7 K/uL   Basophils Relative 1 0 - 1 %   Basophils Absolute 0.1 0.0 - 0.1 K/uL   Smear Review Criteria for review not met   POCT urinalysis dipstick  Result Value Ref Range   Color, UA yellow    Clarity, UA clear    Glucose, UA neg    Bilirubin, UA neg    Ketones, UA neg    Spec Grav, UA 1.015    Blood, UA trace-intact    pH, UA 5.0    Protein, UA neg    Urobilinogen, UA 0.2    Nitrite, UA neg    Leukocytes, UA Negative   POCT UA - Microscopic Only  Result Value Ref Range   WBC, Ur, HPF, POC 0-1    RBC, urine, microscopic 0-1    Bacteria, U Microscopic neg    Mucus, UA neg    Epithelial cells, urine per micros 0-1    Crystals, Ur, HPF, POC neg    Casts, Ur, LPF, POC neg    Yeast, UA negf      EKG/XRAY:   Primary read interpreted by Dr. Marin Comment at [facility]   ASSESSMENT/PLAN: Encounter Diagnoses  Name Primary?  . Skin laceration Yes  . Abrasion    Clean wound as directed Dry dressing applied after thorough cleaning and rx for bactroban given  F/u prn   Gross sideeffects, risk and benefits, and alternatives of medications d/w patient. Patient is aware that all medications have potential sideeffects and we are unable to predict every sideeffect or drug-drug interaction that may occur.  Neka Bise, [place], DO 07/10/2014 12:40 PM

## 2014-07-20 ENCOUNTER — Ambulatory Visit (INDEPENDENT_AMBULATORY_CARE_PROVIDER_SITE_OTHER): Payer: Medicare Other | Admitting: Family Medicine

## 2014-07-20 VITALS — BP 112/60 | HR 77 | Temp 98.6°F | Resp 18 | Ht 72.25 in | Wt 218.8 lb

## 2014-07-20 DIAGNOSIS — J069 Acute upper respiratory infection, unspecified: Secondary | ICD-10-CM

## 2014-07-20 DIAGNOSIS — J3489 Other specified disorders of nose and nasal sinuses: Secondary | ICD-10-CM

## 2014-07-20 NOTE — Patient Instructions (Signed)
Your lungs sound clear. The noise heard sounds like congestion or drainage in back of the throat. You can continue the flonase nasal spray as it helped, saline nasal spray (ask the pharmacist about this) 3or 4 times per day, and mucinex to help bring up the congestion.  If not improving in next week, or any worsening sooner (fever, shortness of breath or congestion goes to your chest) - return for recheck.   Return to the clinic or go to the nearest emergency room if any of your symptoms worsen or new symptoms occur.  Upper Respiratory Infection, Adult An upper respiratory infection (URI) is also sometimes known as the common cold. The upper respiratory tract includes the nose, sinuses, throat, trachea, and bronchi. Bronchi are the airways leading to the lungs. Most people improve within 1 week, but symptoms can last up to 2 weeks. A residual cough may last even longer.  CAUSES Many different viruses can infect the tissues lining the upper respiratory tract. The tissues become irritated and inflamed and often become very moist. Mucus production is also common. A cold is contagious. You can easily spread the virus to others by oral contact. This includes kissing, sharing a glass, coughing, or sneezing. Touching your mouth or nose and then touching a surface, which is then touched by another person, can also spread the virus. SYMPTOMS  Symptoms typically develop 1 to 3 days after you come in contact with a cold virus. Symptoms vary from person to person. They may include:  Runny nose.  Sneezing.  Nasal congestion.  Sinus irritation.  Sore throat.  Loss of voice (laryngitis).  Cough.  Fatigue.  Muscle aches.  Loss of appetite.  Headache.  Low-grade fever. DIAGNOSIS  You might diagnose your own cold based on familiar symptoms, since most people get a cold 2 to 3 times a year. Your caregiver can confirm this based on your exam. Most importantly, your caregiver can check that your  symptoms are not due to another disease such as strep throat, sinusitis, pneumonia, asthma, or epiglottitis. Blood tests, throat tests, and X-rays are not necessary to diagnose a common cold, but they may sometimes be helpful in excluding other more serious diseases. Your caregiver will decide if any further tests are required. RISKS AND COMPLICATIONS  You may be at risk for a more severe case of the common cold if you smoke cigarettes, have chronic heart disease (such as heart failure) or lung disease (such as asthma), or if you have a weakened immune system. The very young and very old are also at risk for more serious infections. Bacterial sinusitis, middle ear infections, and bacterial pneumonia can complicate the common cold. The common cold can worsen asthma and chronic obstructive pulmonary disease (COPD). Sometimes, these complications can require emergency medical care and may be life-threatening. PREVENTION  The best way to protect against getting a cold is to practice good hygiene. Avoid oral or hand contact with people with cold symptoms. Wash your hands often if contact occurs. There is no clear evidence that vitamin C, vitamin E, echinacea, or exercise reduces the chance of developing a cold. However, it is always recommended to get plenty of rest and practice good nutrition. TREATMENT  Treatment is directed at relieving symptoms. There is no cure. Antibiotics are not effective, because the infection is caused by a virus, not by bacteria. Treatment may include:  Increased fluid intake. Sports drinks offer valuable electrolytes, sugars, and fluids.  Breathing heated mist or steam (vaporizer or shower).  Eating chicken soup or other clear broths, and maintaining good nutrition.  Getting plenty of rest.  Using gargles or lozenges for comfort.  Controlling fevers with ibuprofen or acetaminophen as directed by your caregiver.  Increasing usage of your inhaler if you have asthma. Zinc  gel and zinc lozenges, taken in the first 24 hours of the common cold, can shorten the duration and lessen the severity of symptoms. Pain medicines may help with fever, muscle aches, and throat pain. A variety of non-prescription medicines are available to treat congestion and runny nose. Your caregiver can make recommendations and may suggest nasal or lung inhalers for other symptoms.  HOME CARE INSTRUCTIONS   Only take over-the-counter or prescription medicines for pain, discomfort, or fever as directed by your caregiver.  Use a warm mist humidifier or inhale steam from a shower to increase air moisture. This may keep secretions moist and make it easier to breathe.  Drink enough water and fluids to keep your urine clear or pale yellow.  Rest as needed.  Return to work when your temperature has returned to normal or as your caregiver advises. You may need to stay home longer to avoid infecting others. You can also use a face mask and careful hand washing to prevent spread of the virus. SEEK MEDICAL CARE IF:   After the first few days, you feel you are getting worse rather than better.  You need your caregiver's advice about medicines to control symptoms.  You develop chills, worsening shortness of breath, or brown or red sputum. These may be signs of pneumonia.  You develop yellow or brown nasal discharge or pain in the face, especially when you bend forward. These may be signs of sinusitis.  You develop a fever, swollen neck glands, pain with swallowing, or white areas in the back of your throat. These may be signs of strep throat. SEEK IMMEDIATE MEDICAL CARE IF:   You have a fever.  You develop severe or persistent headache, ear pain, sinus pain, or chest pain.  You develop wheezing, a prolonged cough, cough up blood, or have a change in your usual mucus (if you have chronic lung disease).  You develop sore muscles or a stiff neck. Document Released: 12/24/2000 Document Revised:  09/22/2011 Document Reviewed: 10/05/2013 [facility] Patient Information 2015 [place], [place]. This information is not intended to replace advice given to you by your health care provider. Make sure you discuss any questions you have with your health care provider.

## 2014-07-20 NOTE — Progress Notes (Signed)
Subjective:  This chart was scribed for Barry Ray, MD by Jeanell Sparrow, ED Scribe. This patient was seen in room 2 and the patient's care was started at 9:50 AM.  Patient ID: Barry Thompson, male    DOB: 12-Feb-1924, 81 y.o.   MRN: [id number]  HPI HPI Comments: AMAHD MORINO is a 81 y.o. male who presents to the [facility] complaining of rhinorrhea and congestion.   Pt has a hx of multiple medical concerns inlcuding CAD, chronic kidney disease stage IV, pacemaker with atrial fibrillation, and CHF PCP: [facility]  Pt reports that he had been having rhinorrhea and throat congestion over 2 weeks ago with a "hissing" sound at times. He states that he tried some OTC Flonase for the past 2 days with some temporary relief. He denies any chest congestion, fever, or cough.  Patient Active Problem List   Diagnosis Date Noted  . Hypothyroidism 04/13/2014  . Aortic valve disorder 02/02/2014  . Tremor 10/29/2013  . Memory loss 10/29/2013  . Deaf   . Encounter for therapeutic drug monitoring 08/08/2013  . Anemia due to chronic blood loss 04/14/2013  . Chronic kidney disease, stage IV (severe) 04/13/2013  . Chronic diastolic congestive heart failure 04/13/2013  . Nocturia 03/10/2013  . Urine frequency 03/10/2013  . S/P aortic valve replacement 03/10/2013  . Pacemaker-Medtronic 08/16/2012  . Artificial cardiac pacemaker 07/13/2012  . CAD (coronary artery disease) 03/30/2012  . Pericardial effusion 07/19/2011  . Atrial fibrillation   07/14/2011  . HTN (hypertension) 05/22/2011  . Mitral valve disease 05/22/2011  . Mixed hyperlipidemia 05/22/2011  . Atrioventricular block, complete  assoc w TAVR 05/22/2011  . Malignant neoplasm of prostate 05/26/2005   Past Medical History  Diagnosis Date  . Hypertension   . Hyperlipidemia   . Aortic stenosis     Moderately Severe by echo 07/2011  . 1St degree AV block   . Shortness of breath   . Angina   . Chronic kidney disease     renal insufficiency  . Pleural effusion     07/2011 - Left > Right - tapped (L) - Transudative  . Atrial fibrillation     07/2011 - amio and Rivaroxaban initiated - tee/dccv  . Diastolic CHF, chronic     07/2011 NL EF by echo  . Orthostatic hypotension   . Coronary artery disease 01/19/2012  . Cardiac pacemaker in situ 06/01/2012  . Unspecified hypothyroidism 09/24/2011  . Anxiety state, unspecified 09/24/2011  . Unspecified cataract 02/04/2011  . Disturbance of salivary secretion 07/25/2009  . Edema 09/05/2008  . Shortness of breath 09/05/2008  . 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 Insomnia, unspecified 05/09/2005  . Urinary frequency 05/09/2005  . Malignant neoplasm of prostate 03/04/2005  . Hypertrophy of prostate without urinary obstruction and other lower urinary tract symptoms (LUTS) 02/12/2005  . Other malaise and fatigue 02/12/2005  . Nocturia 02/22/2003  . Disturbances of sensation of smell and taste 03/16/2001  . Other specified cardiac dysrhythmias(427.89) 11/19/1999  . Anemia, unspecified   . Deaf   . Tremor 10/29/2013  . Memory loss 10/29/2013   Past Surgical History  Procedure Laterality Date  . Tee without cardioversion  07/17/2011    Procedure: TRANSESOPHAGEAL ECHOCARDIOGRAM (TEE);  Surgeon: Lelon Perla, MD;  Location: [facility] ENDOSCOPY;  Service: Cardiovascular;  Laterality: N/A;  With Cardioversion  . Hemorroidectomy  1968  . Spine surgery  1993    HNP(RUPTURED DISC)[name]  . Prostate surgery  05/26/2005    Dr. Terance Hart  . Eye surgery  2008    left eye ( Dr. Charise Killian)  . Esophagogastroduodenoscopy N/A 04/15/2013    Procedure: ESOPHAGOGASTRODUODENOSCOPY (EGD);  Surgeon: Cleotis Nipper, MD;  Location: [facility] ENDOSCOPY;  Service: Endoscopy;  Laterality: N/A;  . Esophagogastroduodenoscopy N/A 04/18/2013    Procedure: ESOPHAGOGASTRODUODENOSCOPY (EGD);  Surgeon: Wonda Horner, MD;  Location: [facility] ENDOSCOPY;  Service: Endoscopy;  Laterality: N/A;   Allergies    Allergen Reactions  . Mold Extract [Trichophyton]    Prior to Admission medications   Medication Sig Start Date End Date Taking? Authorizing Provider  ALPRAZolam Duanne Moron) 0.25 MG tablet Take one tablet by mouth up to three times daily as needed for nerves 06/22/14  Yes Tiffany L Reed, DO  amiodarone (PACERONE) 200 MG tablet Take one tablet by mouth once daily 04/05/14  Yes Mahima Pandey, MD  amLODipine (NORVASC) 10 MG tablet Take one tablet by mouth once daily for blood pressure 04/05/14  Yes Mahima Pandey, MD  apixaban (ELIQUIS) 2.5 MG TABS tablet Take One tablet by mouth twice daily for anticoagulation 06/01/14  Yes Estill Dooms, MD  atorvastatin (LIPITOR) 20 MG tablet Take one tablet by mouth once daily for cholesterol 04/04/14  Yes Mahima Pandey, MD  bisacodyl (DULCOLAX) 5 MG EC tablet Take 5 mg by mouth daily.   Yes Historical Provider, MD  carvedilol (COREG) 25 MG tablet Take two tablets by mouth every 12 hours for blood pressure 04/04/14  Yes Mahima Pandey, MD  Cyanocobalamin (VITAMIN B-12 PO) Take 1 tablet by mouth every evening.   Yes Historical Provider, MD  cyanocobalamin 100 MCG tablet Take 100 mcg by mouth daily.   Yes Historical Provider, MD  doxazosin (CARDURA) 2 MG tablet One daily to reduce urinary frequency Patient taking differently: Take 2 mg by mouth daily.  02/02/14  Yes Estill Dooms, MD  ferrous sulfate 325 (65 FE) MG tablet One daily to treat anemia Patient taking differently: Take 325 mg by mouth daily with breakfast.  07/19/13  Yes Estill Dooms, MD  furosemide (LASIX) 40 MG tablet TAKE 1 TABLET DAILY TO CONTROL EDEMA 05/12/14  Yes Estill Dooms, MD  furosemide (LASIX) 40 MG tablet Take 40 mg by mouth daily.   Yes Historical Provider, MD  levothyroxine (SYNTHROID, LEVOTHROID) 100 MCG tablet Take one tablet by mouth once daily for thyroid Patient taking differently: Take 100 mcg by mouth daily before breakfast.  04/04/14  Yes Blanchie Serve, MD  mupirocin ointment  (BACTROBAN) 2 % Apply a thin layer to affected area daily 07/10/14  Yes Thao P Le, DO  Omega-3 Fatty Acids (FISH OIL PO) Take 1 tablet by mouth every evening.   Yes Historical Provider, MD  Omega-3 Fatty Acids (FISH OIL) 1000 MG CAPS Take by mouth 2 (two) times daily.   Yes Historical Provider, MD  pantoprazole (PROTONIX) 40 MG tablet Take one tablet by mouth once daily for stomach Patient taking differently: Take 40 mg by mouth daily.  04/04/14  Yes Mahima Bubba Camp, MD  potassium chloride SA (K-DUR,KLOR-CON) 20 MEQ tablet Take one tablet by mouth once daily for potassium 06/20/14  Yes Tiffany L Reed, DO  potassium chloride SA (KLOR-CON M20) 20 MEQ tablet Take 1 tablet (20 mEq total) by mouth daily. 06/26/14  Yes Tiffany L Reed, DO  senna (SENOKOT) 8.6 MG TABS tablet Take 2 tablets by mouth at bedtime.   Yes Historical Provider, MD  spironolactone (ALDACTONE) 25 MG tablet Take one tablet  by mouth nightly Patient taking differently: Take 25 mg by mouth daily.  04/04/14  Yes Mahima Pandey, MD  zolpidem (AMBIEN) 10 MG tablet TAKE 1 TABLET AT BEDTIME AS NEEDED FOR SLEEP. 06/22/14  Yes Estill Dooms, MD   History   Social History  . Marital Status: Married    Spouse Name: N/A    Number of Children: N/A  . Years of Education: N/A   Occupational History  . Not on file.   Social History Main Topics  . Smoking status: Never Smoker   . Smokeless tobacco: Never Used  . Alcohol Use: No  . Drug Use: No  . Sexual Activity: No   Other Topics Concern  . Not on file   Social History Narrative   Lives at [facility] since 08/26/13   Married Romie Minus   Exercise walking daily with cane   Never smoke   Hard of hearing            Review of Systems  Constitutional: Negative for fever.  HENT: Positive for congestion (throat) and rhinorrhea.   Respiratory: Negative for cough.      Objective:   Physical Exam  Constitutional: He is oriented to person, place, and time. He appears  well-developed and well-nourished. No distress.  HENT:  Head: Normocephalic and atraumatic.  Right Ear: Tympanic membrane, external ear and ear canal normal.  Left Ear: Tympanic membrane, external ear and ear canal normal.  Nose: No rhinorrhea.  Mouth/Throat: Oropharynx is clear and moist and mucous membranes are normal. No oropharyngeal exudate or posterior oropharyngeal erythema.  Hearing aid in right ear.  Moist mucosal in the mouth with no redness.  No sinus TTP. Minimal edema in the turbinates of the nose.   Eyes: Conjunctivae are normal. Pupils are equal, round, and reactive to light.  Neck: Neck supple.  Cardiovascular: Normal rate, regular rhythm, normal heart sounds and intact distal pulses.  Exam reveals no gallop and no friction rub.   No murmur heard. Pulmonary/Chest: Effort normal. No respiratory distress. He has no wheezes. He has no rhonchi. He has no rales.  Upper airway noise to the lungs without wheeze. Normal effort. No rhonchi. No rales.  Abdominal: Soft. There is no tenderness.  Musculoskeletal: Normal range of motion.  Lymphadenopathy:    He has no cervical adenopathy.  Neurological: He is alert and oriented to person, place, and time.  Skin: Skin is warm and dry. No rash noted.  Psychiatric: He has a normal mood and affect. His behavior is normal.  Nursing note and vitals reviewed.   Filed Vitals:   07/20/14 0914  BP: 112/60  Pulse: 77  Temp: 98.6 F (37 C)  TempSrc: Oral  Resp: 18  Height: 6' 0.25" (1.835 m)  Weight: 218 lb 12.8 oz (99.247 kg)  SpO2: 94%       Assessment & Plan:  KOA ZOELLER is a 81 y.o. male Acute upper respiratory infection  Rhinorrhea Suspected viral URI.  Ok to continue flonase if effective, saline NS, and Mucinex if needed. RTC precautions and ER precautions discussed with understanding expressed.   No orders of the defined types were placed in this encounter.   Patient Instructions  Your lungs sound clear. The noise  heard sounds like congestion or drainage in back of the throat. You can continue the flonase nasal spray as it helped, saline nasal spray (ask the pharmacist about this) 3or 4 times per day, and mucinex to help bring up the congestion.  If not improving in next week, or any worsening sooner (fever, shortness of breath or congestion goes to your chest) - return for recheck.   Return to the clinic or go to the nearest emergency room if any of your symptoms worsen or new symptoms occur.  Upper Respiratory Infection, Adult An upper respiratory infection (URI) is also sometimes known as the common cold. The upper respiratory tract includes the nose, sinuses, throat, trachea, and bronchi. Bronchi are the airways leading to the lungs. Most people improve within 1 week, but symptoms can last up to 2 weeks. A residual cough may last even longer.  CAUSES Many different viruses can infect the tissues lining the upper respiratory tract. The tissues become irritated and inflamed and often become very moist. Mucus production is also common. A cold is contagious. You can easily spread the virus to others by oral contact. This includes kissing, sharing a glass, coughing, or sneezing. Touching your mouth or nose and then touching a surface, which is then touched by another person, can also spread the virus. SYMPTOMS  Symptoms typically develop 1 to 3 days after you come in contact with a cold virus. Symptoms vary from person to person. They may include:  Runny nose.  Sneezing.  Nasal congestion.  Sinus irritation.  Sore throat.  Loss of voice (laryngitis).  Cough.  Fatigue.  Muscle aches.  Loss of appetite.  Headache.  Low-grade fever. DIAGNOSIS  You might diagnose your own cold based on familiar symptoms, since most people get a cold 2 to 3 times a year. Your caregiver can confirm this based on your exam. Most importantly, your caregiver can check that your symptoms are not due to another disease  such as strep throat, sinusitis, pneumonia, asthma, or epiglottitis. Blood tests, throat tests, and X-rays are not necessary to diagnose a common cold, but they may sometimes be helpful in excluding other more serious diseases. Your caregiver will decide if any further tests are required. RISKS AND COMPLICATIONS  You may be at risk for a more severe case of the common cold if you smoke cigarettes, have chronic heart disease (such as heart failure) or lung disease (such as asthma), or if you have a weakened immune system. The very young and very old are also at risk for more serious infections. Bacterial sinusitis, middle ear infections, and bacterial pneumonia can complicate the common cold. The common cold can worsen asthma and chronic obstructive pulmonary disease (COPD). Sometimes, these complications can require emergency medical care and may be life-threatening. PREVENTION  The best way to protect against getting a cold is to practice good hygiene. Avoid oral or hand contact with people with cold symptoms. Wash your hands often if contact occurs. There is no clear evidence that vitamin C, vitamin E, echinacea, or exercise reduces the chance of developing a cold. However, it is always recommended to get plenty of rest and practice good nutrition. TREATMENT  Treatment is directed at relieving symptoms. There is no cure. Antibiotics are not effective, because the infection is caused by a virus, not by bacteria. Treatment may include:  Increased fluid intake. Sports drinks offer valuable electrolytes, sugars, and fluids.  Breathing heated mist or steam (vaporizer or shower).  Eating chicken soup or other clear broths, and maintaining good nutrition.  Getting plenty of rest.  Using gargles or lozenges for comfort.  Controlling fevers with ibuprofen or acetaminophen as directed by your caregiver.  Increasing usage of your inhaler if you have asthma. Zinc gel and  zinc lozenges, taken in the first  24 hours of the common cold, can shorten the duration and lessen the severity of symptoms. Pain medicines may help with fever, muscle aches, and throat pain. A variety of non-prescription medicines are available to treat congestion and runny nose. Your caregiver can make recommendations and may suggest nasal or lung inhalers for other symptoms.  HOME CARE INSTRUCTIONS   Only take over-the-counter or prescription medicines for pain, discomfort, or fever as directed by your caregiver.  Use a warm mist humidifier or inhale steam from a shower to increase air moisture. This may keep secretions moist and make it easier to breathe.  Drink enough water and fluids to keep your urine clear or pale yellow.  Rest as needed.  Return to work when your temperature has returned to normal or as your caregiver advises. You may need to stay home longer to avoid infecting others. You can also use a face mask and careful hand washing to prevent spread of the virus. SEEK MEDICAL CARE IF:   After the first few days, you feel you are getting worse rather than better.  You need your caregiver's advice about medicines to control symptoms.  You develop chills, worsening shortness of breath, or brown or red sputum. These may be signs of pneumonia.  You develop yellow or brown nasal discharge or pain in the face, especially when you bend forward. These may be signs of sinusitis.  You develop a fever, swollen neck glands, pain with swallowing, or white areas in the back of your throat. These may be signs of strep throat. SEEK IMMEDIATE MEDICAL CARE IF:   You have a fever.  You develop severe or persistent headache, ear pain, sinus pain, or chest pain.  You develop wheezing, a prolonged cough, cough up blood, or have a change in your usual mucus (if you have chronic lung disease).  You develop sore muscles or a stiff neck. Document Released: 12/24/2000 Document Revised: 09/22/2011 Document Reviewed:  10/05/2013 [employer] Patient Information 2015 [place], [place]. This information is not intended to replace advice given to you by your health care provider. Make sure you discuss any questions you have with your health care provider.     I personally performed the services described in this documentation, which was scribed in my presence. The recorded information has been reviewed and considered, and addended by me as needed.

## 2014-07-31 ENCOUNTER — Other Ambulatory Visit: Payer: Self-pay | Admitting: Internal Medicine

## 2014-08-05 NOTE — Progress Notes (Signed)
Patient ID: Barry Thompson, male   DOB: August 29, 1923, 81 y.o.   MRN: [id number] 81 y.o.  with history of AS Corevlave 97mm done at [facility] 2014 . Complicated by heart block and had pacer placed Cath prior to TAVR with moderate disease not needing intervention  Final Conclusions:  1. Moderate coronary artery disease as outlined above with moderate stenosis of the mid LAD, severe stenosis of the left circumflex obtuse marginal subbranch, and nonobstructive disease of the right coronary artery  2. Moderately severe aortic stenosis  3. Elevated left ventricular filling pressures  Had anemia and upper GI bleeding 2015  EGD showed unusual nodular lesion in hiatal hernia that was clipped. F/U endo 32 days latter 10/6 showed no bleeding   Did not like frequent blood checks with coumadin on eliquis now   Device check 02/07/14 reviewed and normal function Medtronic   ROS: Denies fever, malais, weight loss, blurry vision, decreased visual acuity, cough, sputum, SOB, hemoptysis, pleuritic pain, palpitaitons, heartburn, abdominal pain, melena, lower extremity edema, claudication, or rash.  All other systems reviewed and negative  General: Affect appropriate Elderly male with poor memory HEENT: normal Neck supple with no adenopathy JVP normal no bruits no thyromegaly Lungs clear with no wheezing and good diaphragmatic motion Heart:  S1/S2 AR murmur, no rub, gallop or click PMI normal Abdomen: benighn, BS positve, no tenderness, no AAA no bruit.  No HSM or HJR Distal pulses intact with no bruits No edema Neuro non-focal Skin warm and dry No muscular weakness   Current Outpatient Prescriptions  Medication Sig Dispense Refill  . ALPRAZolam (XANAX) 0.25 MG tablet Take one tablet by mouth up to three times daily as needed for nerves 100 tablet 0  . amiodarone (PACERONE) 200 MG tablet Take one tablet by mouth once daily 90 tablet 3  . amLODipine (NORVASC) 10 MG tablet Take one tablet by mouth once daily for  blood pressure 90 tablet 3  . apixaban (ELIQUIS) 2.5 MG TABS tablet Take One tablet by mouth twice daily for anticoagulation 60 tablet 5  . atorvastatin (LIPITOR) 20 MG tablet Take one tablet by mouth once daily for cholesterol 90 tablet 3  . bisacodyl (DULCOLAX) 5 MG EC tablet Take 5 mg by mouth daily.    . carvedilol (COREG) 25 MG tablet Take two tablets by mouth every 12 hours for blood pressure 360 tablet 3  . Cyanocobalamin (VITAMIN B-12 PO) Take 1 tablet by mouth every evening.    . cyanocobalamin 100 MCG tablet Take 100 mcg by mouth daily.    Marland Kitchen doxazosin (CARDURA) 2 MG tablet One daily to reduce urinary frequency (Patient taking differently: Take 2 mg by mouth daily. ) 90 tablet 3  . ferrous sulfate 325 (65 FE) MG tablet One daily to treat anemia (Patient taking differently: Take 325 mg by mouth daily with breakfast. ) 100 tablet 3  . furosemide (LASIX) 40 MG tablet TAKE 1 TABLET DAILY TO CONTROL EDEMA 30 tablet 11  . furosemide (LASIX) 40 MG tablet Take 40 mg by mouth daily.    Marland Kitchen levothyroxine (SYNTHROID, LEVOTHROID) 100 MCG tablet Take one tablet by mouth once daily for thyroid (Patient taking differently: Take 100 mcg by mouth daily before breakfast. ) 90 tablet 3  . mupirocin ointment (BACTROBAN) 2 % Apply a thin layer to affected area daily 30 g 0  . Omega-3 Fatty Acids (FISH OIL PO) Take 1 tablet by mouth every evening.    . Omega-3 Fatty Acids (FISH OIL) 1000  MG CAPS Take by mouth 2 (two) times daily.    . pantoprazole (PROTONIX) 40 MG tablet Take one tablet by mouth once daily for stomach (Patient taking differently: Take 40 mg by mouth daily. ) 90 tablet 3  . potassium chloride SA (K-DUR,KLOR-CON) 20 MEQ tablet Take one tablet by mouth once daily for potassium 30 tablet 1  . potassium chloride SA (KLOR-CON M20) 20 MEQ tablet Take 1 tablet (20 mEq total) by mouth daily. 90 tablet 0  . senna (SENOKOT) 8.6 MG TABS tablet Take 2 tablets by mouth at bedtime.    Marland Kitchen spironolactone  (ALDACTONE) 25 MG tablet Take one tablet by mouth nightly (Patient taking differently: Take 25 mg by mouth daily. ) 90 tablet 3  . zolpidem (AMBIEN) 10 MG tablet TAKE 1 TABLET AT BEDTIME AS NEEDED FOR SLEEP. 30 tablet 1   No current facility-administered medications for this visit.    Allergies  Mold extract  Electrocardiogram:  afib with v pacing  05/29/14  Assessment and Plan

## 2014-08-07 ENCOUNTER — Encounter: Payer: Medicare Other | Admitting: Cardiovascular Disease

## 2014-08-08 ENCOUNTER — Other Ambulatory Visit: Payer: Self-pay | Admitting: Internal Medicine

## 2014-08-31 ENCOUNTER — Ambulatory Visit (INDEPENDENT_AMBULATORY_CARE_PROVIDER_SITE_OTHER): Payer: Medicare Other | Admitting: *Deleted

## 2014-08-31 ENCOUNTER — Ambulatory Visit (INDEPENDENT_AMBULATORY_CARE_PROVIDER_SITE_OTHER): Payer: Medicare Other | Admitting: Cardiovascular Disease

## 2014-08-31 ENCOUNTER — Encounter: Payer: Self-pay | Admitting: Cardiovascular Disease

## 2014-08-31 VITALS — BP 120/64 | HR 66 | Ht 73.0 in | Wt 216.8 lb

## 2014-08-31 DIAGNOSIS — I2510 Atherosclerotic heart disease of native coronary artery without angina pectoris: Secondary | ICD-10-CM

## 2014-08-31 DIAGNOSIS — I10 Essential (primary) hypertension: Secondary | ICD-10-CM

## 2014-08-31 DIAGNOSIS — Z950 Presence of cardiac pacemaker: Secondary | ICD-10-CM

## 2014-08-31 DIAGNOSIS — I442 Atrioventricular block, complete: Secondary | ICD-10-CM

## 2014-08-31 DIAGNOSIS — I359 Nonrheumatic aortic valve disorder, unspecified: Secondary | ICD-10-CM

## 2014-08-31 DIAGNOSIS — I4891 Unspecified atrial fibrillation: Secondary | ICD-10-CM

## 2014-08-31 DIAGNOSIS — I2583 Coronary atherosclerosis due to lipid rich plaque: Secondary | ICD-10-CM

## 2014-08-31 DIAGNOSIS — E782 Mixed hyperlipidemia: Secondary | ICD-10-CM

## 2014-08-31 LAB — MDC_IDC_ENUM_SESS_TYPE_INCLINIC
Battery Remaining Longevity: 73 mo
Brady Statistic AP VS Percent: 0 %
Brady Statistic AS VS Percent: 0 %
Brady Statistic RA Percent Paced: 99.99 %
Brady Statistic RV Percent Paced: 100 %
Date Time Interrogation Session: 20160218145013
Lead Channel Impedance Value: 399 Ohm
Lead Channel Impedance Value: 456 Ohm
Lead Channel Pacing Threshold Amplitude: 0.75 V
Lead Channel Pacing Threshold Amplitude: 1.125 V
Lead Channel Pacing Threshold Pulse Width: 0.4 ms
Lead Channel Sensing Intrinsic Amplitude: 2.5 mV
Lead Channel Setting Pacing Pulse Width: 0.4 ms
Lead Channel Setting Sensing Sensitivity: 0.9 mV
MDC IDC MSMT BATTERY VOLTAGE: 3 V
MDC IDC MSMT LEADCHNL RA SENSING INTR AMPL: 3.625 mV
MDC IDC MSMT LEADCHNL RV IMPEDANCE VALUE: 456 Ohm
MDC IDC MSMT LEADCHNL RV IMPEDANCE VALUE: 570 Ohm
MDC IDC MSMT LEADCHNL RV PACING THRESHOLD PULSEWIDTH: 0.4 ms
MDC IDC MSMT LEADCHNL RV SENSING INTR AMPL: 11 mV
MDC IDC MSMT LEADCHNL RV SENSING INTR AMPL: 7.25 mV
MDC IDC SET LEADCHNL RA PACING AMPLITUDE: 2 V
MDC IDC SET LEADCHNL RV PACING AMPLITUDE: 2.5 V
MDC IDC SET ZONE DETECTION INTERVAL: 350 ms
MDC IDC STAT BRADY AP VP PERCENT: 99.99 %
MDC IDC STAT BRADY AS VP PERCENT: 0.01 %
Zone Setting Detection Interval: 400 ms

## 2014-08-31 NOTE — Assessment & Plan Note (Signed)
Post corevlave TAVR  2013  No AR  No change in murmur   Post procedure echos being done at [facility]

## 2014-08-31 NOTE — Patient Instructions (Signed)
Your physician wants you to follow-up in:   [place] will receive a reminder letter in the mail two months in advance. If you don't receive a letter, please call our office to schedule the follow-up appointment.  Your physician has recommended you make the following change in your medication:  ONLY TAKE  AMIODARONE   Monday   THRU  Friday

## 2014-08-31 NOTE — Assessment & Plan Note (Signed)
Maint NSR  Decrease amiodarone to 200mg  weekdays  TSH/ LFTls ok  No palpitations

## 2014-08-31 NOTE — Assessment & Plan Note (Signed)
Well controlled.  Continue current medications and low sodium Dash type diet.    

## 2014-08-31 NOTE — Progress Notes (Signed)
Patient ID: Barry Thompson, male   DOB: 06/21/1924, 81 y.o.   MRN: [id number]             FOY:DXAJ is an 81 y.o.  male patient of Dr.Meryl Hubers who has history of AS status post  corevalve 29 mm Tavor done at [facility]. This was complicated by heart block and had a pacer placed.Prior to [place] cath was done that showed moderate diseasenot needing intervention. He had moderate stenosis of the mid LAD, severe stenosis of the left circumflex OM subbranch, and nonobstructive disease in the RCA, moderate severe AS with elevated left ventricular filling pressures. Patient also has a history of GI bleed back in 04/2013. Patient was on Coumadin, but fell against his wife's walker and has a huge hematoma in his abdomen. Dr. Nyoka Cowden switched him to Eliquis     Review of most recent PACE ART shows no interval PAF  Amiodarone dose decreased to 200 mg daily  TSH and liver normal 9/15  Dr Caryl Comes suggested 1000mg /week And I spoke with patient about not taking it on weekends  No chest pain dyspnea or palpitations Compliant with meds    Allergies  Allergen Reactions  . Mold Extract [Trichophyton]      Current Outpatient Prescriptions  Medication Sig Dispense Refill  . ALPRAZolam (XANAX) 0.25 MG tablet TAKE 1 TABLET UP TO 3 TIMES A DAY AS NEEDED FOR NERVES. 100 tablet 0  . amiodarone (PACERONE) 200 MG tablet Take one tablet by mouth once daily 90 tablet 3  . amLODipine (NORVASC) 10 MG tablet Take one tablet by mouth once daily for blood pressure 90 tablet 3  . apixaban (ELIQUIS) 2.5 MG TABS tablet Take One tablet by mouth twice daily for anticoagulation 60 tablet 5  . atorvastatin (LIPITOR) 20 MG tablet Take one tablet by mouth once daily for cholesterol 90 tablet 3  . bisacodyl (DULCOLAX) 5 MG EC tablet Take 5 mg by mouth daily.    . carvedilol (COREG) 25 MG tablet Take two tablets by mouth every 12 hours for blood pressure 360 tablet 3  . Cyanocobalamin (VITAMIN B-12 PO) Take 1 tablet by mouth every evening.    Marland Kitchen  doxazosin (CARDURA) 2 MG tablet One daily to reduce urinary frequency (Patient taking differently: Take 2 mg by mouth daily. ) 90 tablet 3  . ferrous sulfate 325 (65 FE) MG tablet One daily to treat anemia (Patient taking differently: Take 325 mg by mouth daily with breakfast. ) 100 tablet 3  . furosemide (LASIX) 40 MG tablet TAKE 1 TABLET DAILY TO CONTROL EDEMA 30 tablet 11  . levothyroxine (SYNTHROID, LEVOTHROID) 100 MCG tablet Take one tablet by mouth once daily for thyroid (Patient taking differently: Take 100 mcg by mouth daily before breakfast. ) 90 tablet 3  . Omega-3 Fatty Acids (FISH OIL PO) Take 1 tablet by mouth every evening. Takes 200 mg tablet by mouth daily.    . pantoprazole (PROTONIX) 40 MG tablet Take one tablet by mouth once daily for stomach (Patient taking differently: Take 40 mg by mouth daily. ) 90 tablet 3  . potassium chloride SA (KLOR-CON M20) 20 MEQ tablet Take 1 tablet (20 mEq total) by mouth daily. 90 tablet 0  . senna (SENOKOT) 8.6 MG TABS tablet Take 2 tablets by mouth at bedtime.    Marland Kitchen spironolactone (ALDACTONE) 25 MG tablet Take one tablet by mouth nightly (Patient taking differently: Take 25 mg by mouth daily. ) 90 tablet 3  . zolpidem (AMBIEN) 10 MG  tablet TAKE 1 TABLET AT BEDTIME AS NEEDED FOR SLEEP. 30 tablet 1   No current facility-administered medications for this visit.    Past Medical History  Diagnosis Date  . Hypertension   . Hyperlipidemia   . Aortic stenosis     Moderately Severe by echo 07/2011  . 1St degree AV block   . Shortness of breath   . Angina   . Chronic kidney disease     renal insufficiency  . Pleural effusion     07/2011 - Left > Right - tapped (L) - Transudative  . Atrial fibrillation     07/2011 - amio and Rivaroxaban initiated - tee/dccv  . Diastolic CHF, chronic     07/2011 NL EF by echo  . Orthostatic hypotension   . Coronary artery disease 01/19/2012  . Cardiac pacemaker in situ 06/01/2012  . Unspecified hypothyroidism  09/24/2011  . Anxiety state, unspecified 09/24/2011  . Unspecified cataract 02/04/2011  . Disturbance of salivary secretion 07/25/2009  . Edema 09/05/2008  . Shortness of breath 09/05/2008  . 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 Insomnia, unspecified 05/09/2005  . Urinary frequency 05/09/2005  . Malignant neoplasm of prostate 03/04/2005  . Hypertrophy of prostate without urinary obstruction and other lower urinary tract symptoms (LUTS) 02/12/2005  . Other malaise and fatigue 02/12/2005  . Nocturia 02/22/2003  . Disturbances of sensation of smell and taste 03/16/2001  . Other specified cardiac dysrhythmias(427.89) 11/19/1999  . Anemia, unspecified   . Deaf   . Tremor 10/29/2013  . Memory loss 10/29/2013    Past Surgical History  Procedure Laterality Date  . Tee without cardioversion  07/17/2011    Procedure: TRANSESOPHAGEAL ECHOCARDIOGRAM (TEE);  Surgeon: Lelon Perla, MD;  Location: [facility] ENDOSCOPY;  Service: Cardiovascular;  Laterality: N/A;  With Cardioversion  . Hemorroidectomy  1968  . Spine surgery  1993    HNP(RUPTURED DISC)[name]  . Prostate surgery  05/26/2005    Dr. Terance Hart  . Eye surgery  2008    left eye ( Dr. Charise Killian)  . Esophagogastroduodenoscopy N/A 04/15/2013    Procedure: ESOPHAGOGASTRODUODENOSCOPY (EGD);  Surgeon: Cleotis Nipper, MD;  Location: [facility] ENDOSCOPY;  Service: Endoscopy;  Laterality: N/A;  . Esophagogastroduodenoscopy N/A 04/18/2013    Procedure: ESOPHAGOGASTRODUODENOSCOPY (EGD);  Surgeon: Wonda Horner, MD;  Location: [facility] ENDOSCOPY;  Service: Endoscopy;  Laterality: N/A;    Family History  Problem Relation Age of Onset  . Cancer Mother   . Heart disease Father   . COPD Brother   . Heart disease Son     History   Social History  . Marital Status: Married    Spouse Name: N/A  . Number of Children: N/A  . Years of Education: N/A   Occupational History  . Not on file.   Social History Main Topics  . Smoking status: Never  Smoker   . Smokeless tobacco: Never Used  . Alcohol Use: No  . Drug Use: No  . Sexual Activity: No   Other Topics Concern  . Not on file   Social History Narrative   Lives at [place] since 08/26/13   Married Romie Minus   Exercise walking daily with cane   Never smoke   Hard of hearing             VVO:HYWV of hearing,see history of present illness otherwise negative  BP 120/64 mmHg  Pulse 66  Ht 6\' 1"  (1.854 m)  Wt 98.34 kg (216 lb 12.8 oz)  BMI 28.61 kg/m2  PHYSICAL EXAM: Well-nournished, in no acute distress. Neck: No JVD, HJR, Bruit, or thyroid enlargement  Lungs: No tachypnea, clear without wheezing, rales, or rhonchi  Cardiovascular: RRR, PMI not displaced, Normal S1 and S2, no murmurs, gallops, bruit, thrill, or heave.  Abdomen:large hematoma in the lower left and mid abdominal region with excessive bruising, resolving. BS normal. Soft without organomegaly, masses, lesions or tenderness.  Extremities: without cyanosis, clubbing or edema. Good distal pulses bilateral  SKin: Warm, no lesions or rashes   Musculoskeletal: No deformities  Neuro: no focal signs   Wt Readings from Last 3 Encounters:  08/31/14 98.34 kg (216 lb 12.8 oz)  07/20/14 99.247 kg (218 lb 12.8 oz)  07/10/14 99.973 kg (220 lb 6.4 oz)    Lab Results  Component Value Date   WBC 5.4 06/01/2014   HGB 15.4 06/01/2014   HCT 45.7 06/01/2014   PLT 198 06/01/2014   GLUCOSE 103* 05/19/2014   CHOL 130 01/24/2014   TRIG 105 01/24/2014   HDL 50 01/24/2014   LDLCALC 59 01/24/2014   ALT 41* 01/24/2014   AST 29 01/24/2014   NA 138 05/19/2014   K 4.8 05/19/2014   CL 99 05/19/2014   CREATININE 1.97* 05/19/2014   BUN 31* 05/19/2014   CO2 25 05/19/2014   TSH 4.70 04/06/2014   INR 1.75* 05/19/2014    EKG:AV paced rhythm  05/29/14

## 2014-08-31 NOTE — Assessment & Plan Note (Signed)
Stable with no angina and good activity level.  Continue medical Rx  

## 2014-08-31 NOTE — Assessment & Plan Note (Signed)
Barry Thompson to interrogate today  Will also check on any PAF

## 2014-08-31 NOTE — Assessment & Plan Note (Signed)
Cholesterol is at goal.  Continue current dose of statin and diet Rx.  No myalgias or side effects.  F/U  LFT's in 6 months. Lab Results  Component Value Date   LDLCALC 59 01/24/2014

## 2014-08-31 NOTE — Progress Notes (Signed)
Pacemaker check in clinic. Normal device function. Thresholds, sensing, impedances consistent with previous measurements. Device programmed to maximize longevity. No mode switch or high ventricular rates noted. Device programmed at appropriate safety margins. Histogram distribution appropriate for patient activity level. Device programmed to optimize intrinsic conduction. Estimated longevity 24yrs. ROV w/ Dr. Caryl Comes in 50mo.

## 2014-09-01 ENCOUNTER — Telehealth: Payer: Self-pay

## 2014-09-01 NOTE — Telephone Encounter (Signed)
ID [id number] Called to initiate PA for Eliquis 2.5 mg tablets. Form will be faxed within 24 hours

## 2014-09-04 NOTE — Telephone Encounter (Signed)
Per Rodena Piety (Triage Assistant), Paperwork was received, completed and placed on ledge for signature, then to be faxed back.

## 2014-09-06 ENCOUNTER — Other Ambulatory Visit: Payer: Self-pay | Admitting: *Deleted

## 2014-09-06 DIAGNOSIS — I4891 Unspecified atrial fibrillation: Secondary | ICD-10-CM

## 2014-09-06 MED ORDER — APIXABAN 2.5 MG PO TABS
ORAL_TABLET | ORAL | Status: DC
Start: 2014-09-06 — End: 2014-11-29

## 2014-09-06 NOTE — Telephone Encounter (Signed)
Eliquis Approved through 09/06/2014. Patient Notified.

## 2014-09-21 ENCOUNTER — Encounter: Payer: Self-pay | Admitting: Internal Medicine

## 2014-09-26 ENCOUNTER — Other Ambulatory Visit: Payer: Self-pay | Admitting: *Deleted

## 2014-09-26 MED ORDER — ALPRAZOLAM 0.25 MG PO TABS: ORAL_TABLET | ORAL | Status: DC

## 2014-09-26 NOTE — Telephone Encounter (Signed)
[facility]  

## 2014-09-28 ENCOUNTER — Other Ambulatory Visit: Payer: Self-pay | Admitting: *Deleted

## 2014-09-28 MED ORDER — ZOLPIDEM TARTRATE 10 MG PO TABS: ORAL_TABLET | ORAL | Status: DC

## 2014-09-28 NOTE — Telephone Encounter (Signed)
[facility]  

## 2014-10-19 LAB — LIPID PANEL
Cholesterol: 127 mg/dL (ref 0–200)
HDL: 32 mg/dL — AB (ref 35–70)
LDL Cholesterol: 67 mg/dL
TRIGLYCERIDES: 138 mg/dL (ref 40–160)

## 2014-10-19 LAB — TSH: TSH: 4.39 u[IU]/mL (ref 0.41–5.90)

## 2014-10-19 LAB — BASIC METABOLIC PANEL
BUN: 33 mg/dL — AB (ref 4–21)
Creatinine: 2 mg/dL — AB (ref 0.6–1.3)
GLUCOSE: 97 mg/dL
POTASSIUM: 4.4 mmol/L (ref 3.4–5.3)
SODIUM: 139 mmol/L (ref 137–147)

## 2014-10-19 LAB — HEPATIC FUNCTION PANEL
ALT: 27 U/L (ref 10–40)
AST: 24 U/L (ref 14–40)
Alkaline Phosphatase: 40 U/L (ref 25–125)

## 2014-10-25 ENCOUNTER — Other Ambulatory Visit: Payer: Self-pay | Admitting: Internal Medicine

## 2014-10-26 ENCOUNTER — Encounter: Payer: Self-pay | Admitting: Internal Medicine

## 2014-10-26 ENCOUNTER — Non-Acute Institutional Stay: Payer: Medicare Other | Admitting: Internal Medicine

## 2014-10-26 VITALS — BP 128/76 | HR 80 | Temp 97.8°F | Wt 215.0 lb

## 2014-10-26 DIAGNOSIS — E039 Hypothyroidism, unspecified: Secondary | ICD-10-CM | POA: Diagnosis not present

## 2014-10-26 DIAGNOSIS — N184 Chronic kidney disease, stage 4 (severe): Secondary | ICD-10-CM | POA: Diagnosis not present

## 2014-10-26 DIAGNOSIS — I4891 Unspecified atrial fibrillation: Secondary | ICD-10-CM | POA: Diagnosis not present

## 2014-10-26 DIAGNOSIS — I2510 Atherosclerotic heart disease of native coronary artery without angina pectoris: Secondary | ICD-10-CM | POA: Diagnosis not present

## 2014-10-26 DIAGNOSIS — R413 Other amnesia: Secondary | ICD-10-CM

## 2014-10-26 DIAGNOSIS — I5032 Chronic diastolic (congestive) heart failure: Secondary | ICD-10-CM

## 2014-10-26 DIAGNOSIS — I10 Essential (primary) hypertension: Secondary | ICD-10-CM | POA: Diagnosis not present

## 2014-10-26 DIAGNOSIS — H6123 Impacted cerumen, bilateral: Secondary | ICD-10-CM | POA: Diagnosis not present

## 2014-10-26 DIAGNOSIS — I2583 Coronary atherosclerosis due to lipid rich plaque: Secondary | ICD-10-CM

## 2014-10-26 DIAGNOSIS — E782 Mixed hyperlipidemia: Secondary | ICD-10-CM

## 2014-10-26 NOTE — Progress Notes (Signed)
Patient ID: Barry Thompson, male   DOB: 1924-03-15, 81 y.o.   MRN: [id number]    [place] of Service: Clinic (12)     Allergies  Allergen Reactions  . Mold Extract [Trichophyton]     Chief Complaint  Patient presents with  . Medical Management of Chronic Issues    blood pressure, CKD, CHF, A-Fib, thyroid. Check ears.    HPI:   Memory loss: Memory is getting worse. Patient dropped another 2 points in the MMSE. He is now having some problems of angry outbursts at his wife.  Atrial fibrillation : Rate controlled and anticoagulated  Coronary artery disease due to lipid rich plaque: Denies angina or palpitations  Chronic diastolic congestive heart failure: Compensated  Chronic kidney disease, stage IV (severe): Unchanged  Essential hypertension: Controlled  Mixed hyperlipidemia: Controlled on current medication  Hypothyroidism, unspecified hypothyroidism type: Controlled on current medication    Medications: Patient's Medications  New Prescriptions   No medications on file  Previous Medications   ALPRAZOLAM (XANAX) 0.25 MG TABLET    Take one tablet by mouth up to three times daily as needed for nerves   AMIODARONE (PACERONE) 200 MG TABLET    Take one tablet by mouth once daily   AMLODIPINE (NORVASC) 10 MG TABLET    Take one tablet by mouth once daily for blood pressure   APIXABAN (ELIQUIS) 2.5 MG TABS TABLET    Take One tablet by mouth twice daily for anticoagulation   ATORVASTATIN (LIPITOR) 20 MG TABLET    Take one tablet by mouth once daily for cholesterol   BISACODYL (DULCOLAX) 5 MG EC TABLET    Take 5 mg by mouth daily.   CARVEDILOL (COREG) 25 MG TABLET    Take two tablets by mouth every 12 hours for blood pressure   CYANOCOBALAMIN (VITAMIN B-12 PO)    Take 1 tablet by mouth every evening.   DOXAZOSIN (CARDURA) 2 MG TABLET    One daily to reduce urinary frequency   FERROUS SULFATE 325 (65 FE) MG TABLET    One daily to treat anemia   FUROSEMIDE (LASIX) 40 MG TABLET    TAKE 1 TABLET DAILY TO CONTROL EDEMA   LEVOTHYROXINE (SYNTHROID, LEVOTHROID) 100 MCG TABLET    Take one tablet by mouth once daily for thyroid   OMEGA-3 FATTY ACIDS (FISH OIL PO)    Take 1 tablet by mouth every evening. Takes 200 mg tablet by mouth daily.   PANTOPRAZOLE (PROTONIX) 40 MG TABLET    Take one tablet by mouth once daily for stomach   POTASSIUM CHLORIDE SA (K-DUR,KLOR-CON) 20 MEQ TABLET    TAKE 1 TABLET ONCE DAILY.   SENNA (SENOKOT) 8.6 MG TABS TABLET    Take 2 tablets by mouth at bedtime.   SPIRONOLACTONE (ALDACTONE) 25 MG TABLET    Take one tablet by mouth nightly   ZOLPIDEM (AMBIEN) 10 MG TABLET    Take one tablet by mouth at bedtime as needed for sleep  Modified Medications   No medications on file  Discontinued Medications   No medications on file     Review of Systems  Constitutional: Positive for fatigue. Negative for activity change, appetite change and unexpected weight change.  HENT: Positive for hearing loss and tinnitus. Negative for congestion and ear pain.        Bilateral cerumen impaction  Eyes: Negative.   Respiratory: Negative for cough, chest tightness and shortness of breath.  History of dyspnea in the past typically with exertion. He feels he is doing better with his.  Cardiovascular: Negative for chest pain, palpitations and leg swelling.       History of aortic valve replacement. History of pacemaker implantation.  Gastrointestinal: Positive for constipation. Negative for abdominal pain and abdominal distention.       Poor appetite.  Endocrine: Negative.   Genitourinary:       History prostate cancer, urinary frequency, and urgency. He is impotent. He has nocturia x2-3. In the daytime he voids every 90 minutes.  Musculoskeletal: Negative.  Negative for neck pain and neck stiffness.  Skin: Positive for pallor. Negative for rash and wound.  Neurological: Positive for tremors. Negative for syncope, weakness and  numbness.       Memory loss  Hematological: Negative.   Psychiatric/Behavioral: Positive for confusion. The patient is nervous/anxious.     Filed Vitals:   10/26/14 1543  BP: 128/76  Pulse: 80  Temp: 97.8 F (36.6 C)  TempSrc: Oral  Weight: 215 lb (97.523 kg)  SpO2: 93%   Body mass index is 28.37 kg/(m^2).  Physical Exam  Constitutional: He is oriented to person, place, and time. He appears well-developed and well-nourished. No distress.  HENT:  Head: Normocephalic and atraumatic.  Nose: Nose normal.  Mouth/Throat: Oropharynx is clear and moist.  Partial deafness bilaterally. Bilateral cerumen impaction.  Eyes: Conjunctivae and EOM are normal. Pupils are equal, round, and reactive to light.  Wears corrective lenses.  Neck: No JVD present. No tracheal deviation present. No thyromegaly present.  Cardiovascular: Normal rate, regular rhythm and intact distal pulses.  Exam reveals no gallop and no friction rub.   2/6 systolic ejection murmur at the base of the heart. Pacemaker left upper anterior chest. Audible systolic click.  Pulmonary/Chest: No respiratory distress. He has no wheezes. He exhibits no tenderness.  Breathing is even and unlabored at rest.  Abdominal: He exhibits no distension and no mass. There is no tenderness.  Musculoskeletal: Normal range of motion. He exhibits no edema or tenderness.  Lymphadenopathy:    He has no cervical adenopathy.  Neurological: He is alert and oriented to person, place, and time. No cranial nerve deficit. Coordination normal.  Resting tremor bilaterally. Worse on the right side. 01/17/2009 MMSE 25/30. Passed clock drawing. 10/26/14 MMSE 23/30. Passed clock drawing.  Skin: No rash noted. No erythema. No pallor.  Multiple seborrheic keratoses. Common Nevus left neck posterior to the ear.  Psychiatric: He has a normal mood and affect. His behavior is normal. Judgment and thought content normal.     Labs reviewed: Nursing Home on  10/26/2014  Component Date Value Ref Range Status  . Glucose 10/19/2014 97   Final  . BUN 10/19/2014 33* 4 - 21 mg/dL Final  . Creatinine 10/19/2014 2.0* 0.6 - 1.3 mg/dL Final  . Potassium 10/19/2014 4.4  3.4 - 5.3 mmol/L Final  . Sodium 10/19/2014 139  137 - 147 mmol/L Final  . Triglycerides 10/19/2014 138  40 - 160 mg/dL Final  . Cholesterol 10/19/2014 127  0 - 200 mg/dL Final  . HDL 10/19/2014 32* 35 - 70 mg/dL Final  . LDL Cholesterol 10/19/2014 67   Final  . Alkaline Phosphatase 10/19/2014 40  25 - 125 U/L Final  . ALT 10/19/2014 27  10 - 40 U/L Final  . AST 10/19/2014 24  14 - 40 U/L Final  . TSH 10/19/2014 4.39  0.41 - 5.90 uIU/mL Final  Clinical Support on 08/31/2014  Component Date Value Ref Range Status  . Date Time Interrogation Session 08/31/2014 [id number]   Final  . Implantable Pulse Generator Manufa* 08/31/2014 Medtronic   Final  . Implantable Pulse Generator Model 08/31/2014 A2DR01 Advisa DR MRI   Final  . Implantable Pulse Generator Serial* 08/31/2014 [id number]   Final  . Lead Channel Setting Sensing Sensi* 08/31/2014 0.9   Final  . Lead Channel Setting Pacing Amplit* 08/31/2014 2   Final  . Lead Channel Setting Pacing Pulse * 08/31/2014 0.4   Final  . Lead Channel Setting Pacing Amplit* 08/31/2014 2.5   Final  . Zone Setting Type Category 08/31/2014 VF   Final  . Zone Setting Type Category 08/31/2014 VT   Final  . Zone Setting Type Category 08/31/2014 VENTRICULAR_TACHYCARDIA_1   Final  . Zone Setting Type Category 08/31/2014 VENTRICULAR_TACHYCARDIA_2   Final  . Zone Setting Detection Interval 08/31/2014 400   Final  . Zone Setting Type Category 08/31/2014 ATRIAL_FIBRILLATION   Final  . Zone Setting Type Category 08/31/2014 ATAF   Final  . Zone Setting Detection Interval 08/31/2014 350   Final  . Lead Channel Impedance Value 08/31/2014 456   Final  . Lead Channel Impedance Value 08/31/2014 399   Final  . Lead Channel Sensing Intrinsic Amp* 08/31/2014 2.5    Final  . Lead Channel Sensing Intrinsic Amp* 08/31/2014 3.625   Final  . Lead Channel Pacing Threshold Ampl* 08/31/2014 0.75   Final  . Lead Channel Pacing Threshold Puls* 08/31/2014 0.4   Final  . Lead Channel Impedance Value 08/31/2014 570   Final  . Lead Channel Impedance Value 08/31/2014 456   Final  . Lead Channel Sensing Intrinsic Amp* 08/31/2014 7.25   Final  . Lead Channel Sensing Intrinsic Amp* 08/31/2014 11   Final  . Lead Channel Pacing Threshold Ampl* 08/31/2014 1.125   Final  . Lead Channel Pacing Threshold Puls* 08/31/2014 0.4   Final  . Battery Status 08/31/2014 OK   Final  . Battery Remaining Longevity 08/31/2014 73   Final  . Battery Voltage 08/31/2014 3.00   Final  . Loletha Grayer Statistic RA Percent Paced 08/31/2014 99.99   Final  . Loletha Grayer Statistic RV Percent Paced 08/31/2014 100.00   Final  . Loletha Grayer Statistic AP VP Percent 08/31/2014 99.99   Final  . Loletha Grayer Statistic AS VP Percent 08/31/2014 0.01   Final  . Loletha Grayer Statistic AP VS Percent 08/31/2014 0   Final  . Loletha Grayer Statistic AS VS Percent 08/31/2014 0   Final  . Eval Rhythm 08/31/2014 Ap@30 /Vp@40    Final  . Miscellaneous Comment 08/31/2014    Final                   Value:Pacemaker check in clinic. Normal device function. Thresholds, sensing, impedances consistent with previous measurements. Device programmed to maximize longevity. No mode switch or high ventricular rates noted. Device programmed at appropriate safety  margins. Histogram distribution appropriate for patient activity level. Device programmed to optimize intrinsic conduction. Estimated longevity 23yrs. ROV w/ Dr. Caryl Comes in 81mo.      Assessment/Plan 1. Memory loss - Hepatic function panel - donepezil (ARICEPT) 10 MG tablet; 1 tablet daily to help preserve memory  Dispense: 30 tablet; Refill: 3  2. Atrial fibrillation   Chronic. Rate controlled and anticoagulated  3. Coronary artery disease due to lipid rich plaque Stable  4. Chronic diastolic congestive  heart failure Compensated  5. Chronic kidney disease, stage IV (severe) Stable  6. Essential hypertension Controlled -  Basic metabolic panel  7. Mixed hyperlipidemia - Lipid panel  8. Hypothyroidism, unspecified hypothyroidism type - TSH  9. Cerumen impaction, bilateral -Bilateral ear lavage while in the office today. Patient tolerated procedure and cerumen was evacuated.

## 2014-10-26 NOTE — Progress Notes (Signed)
Passed clock drawing 

## 2014-10-27 DIAGNOSIS — H6120 Impacted cerumen, unspecified ear: Secondary | ICD-10-CM | POA: Insufficient documentation

## 2014-10-27 MED ORDER — DONEPEZIL HCL 10 MG PO TABS: ORAL_TABLET | ORAL | Status: DC

## 2014-11-20 ENCOUNTER — Other Ambulatory Visit: Payer: Self-pay | Admitting: Internal Medicine

## 2014-11-20 ENCOUNTER — Other Ambulatory Visit: Payer: Self-pay | Admitting: *Deleted

## 2014-11-20 MED ORDER — ALPRAZOLAM 0.25 MG PO TABS: ORAL_TABLET | ORAL | Status: DC

## 2014-11-20 NOTE — Telephone Encounter (Signed)
[facility]  

## 2014-11-29 ENCOUNTER — Other Ambulatory Visit: Payer: Self-pay | Admitting: Internal Medicine

## 2014-11-30 ENCOUNTER — Other Ambulatory Visit: Payer: Self-pay | Admitting: *Deleted

## 2014-11-30 MED ORDER — ZOLPIDEM TARTRATE 10 MG PO TABS: ORAL_TABLET | ORAL | Status: DC

## 2014-11-30 NOTE — Telephone Encounter (Signed)
[facility]  

## 2015-01-03 ENCOUNTER — Other Ambulatory Visit: Payer: Self-pay | Admitting: Internal Medicine

## 2015-01-11 ENCOUNTER — Encounter: Payer: Self-pay | Admitting: Nurse Practitioner

## 2015-01-11 ENCOUNTER — Non-Acute Institutional Stay: Payer: Medicare Other | Admitting: Nurse Practitioner

## 2015-01-11 VITALS — BP 132/64 | HR 65 | Temp 97.7°F | Wt 212.0 lb

## 2015-01-11 DIAGNOSIS — I5032 Chronic diastolic (congestive) heart failure: Secondary | ICD-10-CM

## 2015-01-11 DIAGNOSIS — E039 Hypothyroidism, unspecified: Secondary | ICD-10-CM

## 2015-01-11 DIAGNOSIS — I10 Essential (primary) hypertension: Secondary | ICD-10-CM

## 2015-01-11 DIAGNOSIS — I4891 Unspecified atrial fibrillation: Secondary | ICD-10-CM

## 2015-01-11 DIAGNOSIS — I2510 Atherosclerotic heart disease of native coronary artery without angina pectoris: Secondary | ICD-10-CM | POA: Diagnosis not present

## 2015-01-11 DIAGNOSIS — K219 Gastro-esophageal reflux disease without esophagitis: Secondary | ICD-10-CM | POA: Insufficient documentation

## 2015-01-11 DIAGNOSIS — J300 Vasomotor rhinitis: Secondary | ICD-10-CM | POA: Diagnosis not present

## 2015-01-11 DIAGNOSIS — C61 Malignant neoplasm of prostate: Secondary | ICD-10-CM | POA: Diagnosis not present

## 2015-01-11 DIAGNOSIS — R413 Other amnesia: Secondary | ICD-10-CM | POA: Diagnosis not present

## 2015-01-11 NOTE — Assessment & Plan Note (Addendum)
Heart rate is in control, takes Amiodarone, Norvasc, and Carvedilol. Takes Eliquis for thromboembolic risk reduction.

## 2015-01-11 NOTE — Assessment & Plan Note (Signed)
Takes Aricept, living [place] with assistance of his wife.

## 2015-01-11 NOTE — Assessment & Plan Note (Signed)
Sleeps 8 hours at night without urination. Also no urinary retention.

## 2015-01-11 NOTE — Assessment & Plan Note (Signed)
Takes Levothyroxine 171mcg daily.

## 2015-01-11 NOTE — Assessment & Plan Note (Signed)
Blood pressure is controlled, takes Norvasc and Carvedilol

## 2015-01-11 NOTE — Assessment & Plan Note (Signed)
Compensated, clear lungs, and no apparent peripheral edema, takes Furosemide and Spironolactone.

## 2015-01-11 NOTE — Assessment & Plan Note (Signed)
Been several month, associated with eating mainly at dinner time, clear nasal drainage and post nasal drainage, denied chest pain or cough at night or SOB. Flonase with little effect. Will try Atrovent nasal spray I each nostril ac dinner. Also may treat possible GERD with Omeprazole.

## 2015-01-11 NOTE — Progress Notes (Signed)
Patient ID: Barry Thompson, male   DOB: 06/11/1924, 81 y.o.   MRN: [id number]   Code Status: prior.   Allergies  Allergen Reactions  . Mold Extract [Trichophyton]     Chief Complaint  Patient presents with  . Acute Visit    Chest congestion, at night nasal drainage down throat. Been going on 5 months, gotten worse. Been using Flonase some, helps a little    HPI: Patient is a 81 y.o. male seen in the clinic at [facility] today for clear nasal drainage and other chronic medical conditions.  Problem List Items Addressed This Visit    HTN (hypertension) (Chronic)    Blood pressure is controlled, takes Norvasc and Carvedilol      Atrial fibrillation   (Chronic)    Heart rate is in control, takes Amiodarone, Norvasc, and Carvedilol. Takes Eliquis for thromboembolic risk reduction.       Chronic diastolic congestive heart failure (Chronic)    Compensated, clear lungs, and no apparent peripheral edema, takes Furosemide and Spironolactone.       Memory loss (Chronic)    Takes Aricept, living [place] with [place] of his [place].       Hypothyroidism (Chronic)    Takes Levothyroxine 129mcg daily.       Malignant neoplasm of prostate    Sleeps 8 hours at night without urination. Also no urinary retention.       Vasomotor rhinitis - Primary   GERD (gastroesophageal reflux disease)    Been several month, associated with eating mainly at dinner time, clear nasal drainage and post nasal drainage, denied chest pain or cough at night or SOB. Flonase with little effect. Will try Atrovent nasal spray I each nostril ac dinner. Also may treat possible GERD with Omeprazole.         Review of Systems:  Review of Systems  Constitutional: Positive for fatigue. Negative for activity change, appetite change and unexpected weight change.  HENT: Positive for hearing loss and tinnitus. Negative for congestion and ear pain.        Clear nasal drainage associated with eating mainly at dinner  time.   Eyes: Negative.   Respiratory: Negative for cough, chest tightness and shortness of breath.        History of dyspnea in the past typically with exertion. He feels he is doing better with his.  Cardiovascular: Negative for chest pain, palpitations and leg swelling.       History of aortic valve replacement. History of pacemaker implantation.  Gastrointestinal: Positive for constipation. Negative for abdominal pain and abdominal distention.       Poor appetite.  Endocrine: Negative.   Genitourinary:       History prostate cancer, urinary frequency, and urgency. He is impotent. He has nocturia x2-3. In the daytime he voids every 90 minutes.  Musculoskeletal: Negative.  Negative for neck pain and neck stiffness.  Skin: Positive for pallor. Negative for rash and wound.  Neurological: Positive for tremors. Negative for syncope, weakness and numbness.       Memory loss  Hematological: Negative.   Psychiatric/Behavioral: Positive for confusion. The patient is nervous/anxious.       Past Medical History  Diagnosis Date  . Hypertension   . Hyperlipidemia   . Aortic stenosis     Moderately Severe by echo 07/2011  . 1St degree AV block   . Shortness of breath   . Angina   . Chronic kidney disease     renal insufficiency  .  Pleural effusion     07/2011 - Left > Right - tapped (L) - Transudative  . Atrial fibrillation     07/2011 - amio and Rivaroxaban initiated - tee/dccv  . Diastolic CHF, chronic     07/2011 NL EF by echo  . Orthostatic hypotension   . Coronary artery disease 01/19/2012  . Cardiac pacemaker in situ 06/01/2012  . Unspecified hypothyroidism 09/24/2011  . Anxiety state, unspecified 09/24/2011  . Unspecified cataract 02/04/2011  . Disturbance of salivary secretion 07/25/2009  . Edema 09/05/2008  . Shortness of breath 09/05/2008  . 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 Insomnia, unspecified 05/09/2005  . Urinary frequency 05/09/2005  . Malignant neoplasm of  prostate 03/04/2005  . Hypertrophy of prostate without urinary obstruction and other lower urinary tract symptoms (LUTS) 02/12/2005  . Other malaise and fatigue 02/12/2005  . Nocturia 02/22/2003  . Disturbances of sensation of smell and taste 03/16/2001  . Other specified cardiac dysrhythmias(427.89) 11/19/1999  . Anemia, unspecified   . Deaf   . Tremor 10/29/2013  . Memory loss 10/29/2013  . Hyperglycemia    Past Surgical History  Procedure Laterality Date  . Tee without cardioversion  07/17/2011    Procedure: TRANSESOPHAGEAL ECHOCARDIOGRAM (TEE);  Surgeon: Lelon Perla, MD;  Location: [facility] ENDOSCOPY;  Service: Cardiovascular;  Laterality: N/A;  With Cardioversion  . Hemorroidectomy  1968  . Spine surgery  1993    HNP(RUPTURED DISC)[name]  . Prostate surgery  05/26/2005    Dr. Terance Hart  . Eye surgery  2008    left eye ( Dr. Charise Killian)  . Esophagogastroduodenoscopy N/A 04/15/2013    Procedure: ESOPHAGOGASTRODUODENOSCOPY (EGD);  Surgeon: Cleotis Nipper, MD;  Location[facility] ENDOSCOPY;  Service: Endoscopy;  Laterality: N/A;  . Esophagogastroduodenoscopy N/A 04/18/2013    Procedure: ESOPHAGOGASTRODUODENOSCOPY (EGD);  Surgeon: Wonda Horner, MD;  Location: [facility] ENDOSCOPY;  Service: Endoscopy;  Laterality: N/A;   Social History:   reports that he has never smoked. He has never used smokeless tobacco. He reports that he does not drink alcohol or use illicit drugs.  Family History  Problem Relation Age of Onset  . Cancer Mother   . Heart disease Father   . COPD Brother   . Heart disease Son     Medications: Patient's Medications  New Prescriptions   No medications on file  Previous Medications   ALPRAZOLAM (XANAX) 0.25 MG TABLET    TAKE 1 TABLET UP TO 3 TIMES A DAY AS NEEDED FOR NERVES.   AMIODARONE (PACERONE) 200 MG TABLET    Take one tablet by mouth once daily   AMLODIPINE (NORVASC) 10 MG TABLET    Take one tablet by mouth once daily for blood pressure   ATORVASTATIN  (LIPITOR) 20 MG TABLET    Take one tablet by mouth once daily for cholesterol   BISACODYL (DULCOLAX) 5 MG EC TABLET    Take 5 mg by mouth daily.   CARVEDILOL (COREG) 25 MG TABLET    Take two tablets by mouth every 12 hours for blood pressure   CYANOCOBALAMIN (VITAMIN B-12 PO)    Take 1 tablet by mouth every evening.   DONEPEZIL (ARICEPT) 10 MG TABLET    1 tablet daily to help preserve memory   DOXAZOSIN (CARDURA) 2 MG TABLET    TAKE 1 TABLET DAILY TO REDUCE URINARY FREQUENCY.   ELIQUIS 2.5 MG TABS TABLET    TAKE 1 TABLET TWICE A DAY FOR ANTI COAGULATION.   FERROUS SULFATE 325 (65  FE) MG TABLET    One daily to treat anemia   FUROSEMIDE (LASIX) 40 MG TABLET    TAKE 1 TABLET DAILY TO CONTROL EDEMA   LEVOTHYROXINE (SYNTHROID, LEVOTHROID) 100 MCG TABLET    Take one tablet by mouth once daily for thyroid   OMEGA-3 FATTY ACIDS (FISH OIL PO)    Take 1 tablet by mouth every evening. Takes 200 mg tablet by mouth daily.   PANTOPRAZOLE (PROTONIX) 40 MG TABLET    Take one tablet by mouth once daily for stomach   POTASSIUM CHLORIDE SA (K-DUR,KLOR-CON) 20 MEQ TABLET    TAKE 1 TABLET ONCE DAILY.   SENNA (SENOKOT) 8.6 MG TABS TABLET    Take 2 tablets by mouth at bedtime.   SPIRONOLACTONE (ALDACTONE) 25 MG TABLET    Take one tablet by mouth nightly   ZOLPIDEM (AMBIEN) 10 MG TABLET    Take one tablet by mouth at bedtime as needed for sleep  Modified Medications   No medications on file  Discontinued Medications   No medications on file     Physical Exam: Physical Exam  Constitutional: He is oriented to person, place, and time. He appears well-developed and well-nourished. No distress.  HENT:  Head: Normocephalic and atraumatic.  Nose: Nose normal.  Mouth/Throat: Oropharynx is clear and moist.  Partial deafness bilaterally. Bilateral cerumen impaction.  Eyes: Conjunctivae and EOM are normal. Pupils are equal, round, and reactive to light.  Wears corrective lenses.  Neck: No JVD present. No tracheal  deviation present. No thyromegaly present.  Cardiovascular: Normal rate, regular rhythm and intact distal pulses.  Exam reveals no gallop and no friction rub.   2/6 systolic ejection murmur at the base of the heart. Pacemaker left upper anterior chest. Audible systolic click.  Pulmonary/Chest: No respiratory distress. He has no wheezes. He exhibits no tenderness.  Breathing is even and unlabored at rest.  Abdominal: He exhibits no distension and no mass. There is no tenderness.  Musculoskeletal: Normal range of motion. He exhibits no edema or tenderness.  Lymphadenopathy:    He has no cervical adenopathy.  Neurological: He is alert and oriented to person, place, and time. No cranial nerve deficit. Coordination normal.  Resting tremor bilaterally. Worse on the right side. 01/17/2009 MMSE 25/30. Passed clock drawing. 10/26/14 MMSE 23/30. Passed clock drawing.  Skin: No rash noted. No erythema. No pallor.  Multiple seborrheic keratoses. Common Nevus left neck posterior to the ear.  Psychiatric: He has a normal mood and affect. His behavior is normal. Judgment and thought content normal.    Filed Vitals:   01/11/15 1400  BP: 132/64  Pulse: 65  Temp: 97.7 F (36.5 C)  TempSrc: Oral  Weight: 212 lb (96.163 kg)  SpO2: 98%      Labs reviewed: Basic Metabolic Panel:  Recent Labs  01/24/14 04/06/14 05/19/14 0203 10/19/14  NA 141 139 138 139  K 4.1 4.3 4.8 4.4  CL  --   --  99  --   CO2  --   --  25  --   GLUCOSE  --   --  103*  --   BUN 27* 37* 31* 33*  CREATININE 1.7* 2.2* 1.97* 2.0*  CALCIUM  --   --  9.2  --   TSH 5.76 4.70  --  4.39   Liver Function Tests:  Recent Labs  01/24/14 10/19/14  AST 29 24  ALT 41* 27  ALKPHOS 42 40   No results for input(s): LIPASE, AMYLASE  in the last 8760 hours. No results for input(s): AMMONIA in the last 8760 hours. CBC:  Recent Labs  01/24/14 05/19/14 0203 06/01/14 1135  WBC 5.3 4.9 5.4  NEUTROABS  --  2.9 3.5  HGB 13.7 14.4  15.4  HCT 41 42.6 45.7  MCV  --  87.5 88.1  PLT 125* 115* 198   Lipid Panel:  Recent Labs  01/24/14 10/19/14  CHOL 130 127  HDL 50 32*  LDLCALC 59 67  TRIG 105 138   Anemia Panel: No results for input(s): FOLATE, IRON, VITAMINB12 in the last 8760 hours.  Past Procedures:  05/19/14 CXR:   IMPRESSION: Pacemaker. Aortic region stent device. Left ventricular prominence. No active process evident.  Assessment/Plan GERD (gastroesophageal reflux disease) Been several month, associated with eating mainly at dinner time, clear nasal drainage and post nasal drainage, denied chest pain or cough at night or SOB. Flonase with little effect. Will try Atrovent nasal spray I each nostril ac dinner. Also may treat possible GERD with Omeprazole.  Atrial fibrillation   Heart rate is in control, takes Amiodarone, Norvasc, and Carvedilol. Takes Eliquis for thromboembolic risk reduction.   HTN (hypertension) Blood pressure is controlled, takes Norvasc and Carvedilol  Memory loss Takes Aricept, living [place] with assistance of his wife.   Malignant neoplasm of prostate Sleeps 8 hours at night without urination. Also no urinary retention.   Hypothyroidism Takes Levothyroxine 167mcg daily.   Chronic diastolic congestive heart failure Compensated, clear lungs, and no apparent peripheral edema, takes Furosemide and Spironolactone.     Family/ Staff Communication: observe the patient.   Goals of Care: [place]  Labs/tests ordered: none

## 2015-01-12 ENCOUNTER — Telehealth: Payer: Self-pay

## 2015-01-12 MED ORDER — OMEPRAZOLE 20 MG PO CPDR: 20.0000 mg | DELAYED_RELEASE_CAPSULE | Freq: Every day | ORAL | Status: AC

## 2015-01-12 MED ORDER — IPRATROPIUM BROMIDE 0.03 % NA SOLN: NASAL | Status: DC

## 2015-01-12 NOTE — Telephone Encounter (Signed)
[employer] wrote Atrovent Nasal spray and Omepirazole for patient, wasn't able to fax to pharmacy yesterday, computer went down. Faxed to [facility]

## 2015-01-30 ENCOUNTER — Other Ambulatory Visit: Payer: Self-pay | Admitting: Nurse Practitioner

## 2015-02-07 ENCOUNTER — Other Ambulatory Visit: Payer: Self-pay

## 2015-02-07 MED ORDER — ATORVASTATIN CALCIUM 20 MG PO TABS: ORAL_TABLET | ORAL | Status: AC

## 2015-02-08 LAB — BASIC METABOLIC PANEL
BUN: 28 mg/dL — AB (ref 4–21)
CREATININE: 1.9 mg/dL — AB (ref 0.6–1.3)
Glucose: 92 mg/dL
POTASSIUM: 4.6 mmol/L (ref 3.4–5.3)
Sodium: 135 mmol/L — AB (ref 137–147)

## 2015-02-08 LAB — TSH: TSH: 4.42 u[IU]/mL (ref 0.41–5.90)

## 2015-02-08 LAB — HEMOGLOBIN A1C: Hgb A1c MFr Bld: 5.9 % (ref 4.0–6.0)

## 2015-02-09 ENCOUNTER — Encounter: Payer: Self-pay | Admitting: *Deleted

## 2015-02-15 ENCOUNTER — Encounter: Payer: Self-pay | Admitting: Internal Medicine

## 2015-02-15 ENCOUNTER — Non-Acute Institutional Stay: Payer: Medicare Other | Admitting: Internal Medicine

## 2015-02-15 VITALS — BP 126/68 | HR 75 | Temp 98.4°F | Resp 20 | Ht 73.0 in | Wt 213.4 lb

## 2015-02-15 DIAGNOSIS — R413 Other amnesia: Secondary | ICD-10-CM

## 2015-02-15 DIAGNOSIS — E039 Hypothyroidism, unspecified: Secondary | ICD-10-CM | POA: Diagnosis not present

## 2015-02-15 DIAGNOSIS — R251 Tremor, unspecified: Secondary | ICD-10-CM | POA: Diagnosis not present

## 2015-02-15 DIAGNOSIS — J300 Vasomotor rhinitis: Secondary | ICD-10-CM

## 2015-02-15 DIAGNOSIS — N184 Chronic kidney disease, stage 4 (severe): Secondary | ICD-10-CM

## 2015-02-15 DIAGNOSIS — I4891 Unspecified atrial fibrillation: Secondary | ICD-10-CM

## 2015-02-15 DIAGNOSIS — I5032 Chronic diastolic (congestive) heart failure: Secondary | ICD-10-CM | POA: Diagnosis not present

## 2015-02-15 DIAGNOSIS — I2510 Atherosclerotic heart disease of native coronary artery without angina pectoris: Secondary | ICD-10-CM

## 2015-02-15 DIAGNOSIS — I10 Essential (primary) hypertension: Secondary | ICD-10-CM

## 2015-02-15 DIAGNOSIS — E782 Mixed hyperlipidemia: Secondary | ICD-10-CM | POA: Diagnosis not present

## 2015-02-15 NOTE — Progress Notes (Signed)
Patient ID: Barry Thompson, male   DOB: Mar 15, 1924, 81 y.o.   MRN: [id number]    [place] of Service: Clinic (12)     Allergies  Allergen Reactions  . Mold Extract [Trichophyton]     Chief Complaint  Patient presents with  . Medical Management of Chronic Issues    when he starts to eat supper his nose runs and then no more problems.    HPI:  Memory loss: calmer since on donepezil  Essential hypertension; controlled  Chronic diastolic congestive heart failure: compensated  Atrial fibrillation  : controlled rate  Tremor: unchanged  Mixed hyperlipidemia: no recent lab  Hypothyroidism, unspecified hypothyroidism type: stable and compensated  Chronic kidney disease, stage IV (severe): stable  Vasomotor rhinitis: continues to complain of his nose dripping at mealtimes    Medications: Patient's Medications  New Prescriptions   No medications on file  Previous Medications   ALPRAZOLAM (XANAX) 0.25 MG TABLET    TAKE 1 TABLET UP TO 3 TIMES A DAY AS NEEDED FOR NERVES.   AMIODARONE (PACERONE) 200 MG TABLET    Take one tablet by mouth once daily   AMLODIPINE (NORVASC) 10 MG TABLET    Take one tablet by mouth once daily for blood pressure   ATORVASTATIN (LIPITOR) 20 MG TABLET    Take one tablet by mouth once daily for cholesterol   BISACODYL (DULCOLAX) 5 MG EC TABLET    Take 5 mg by mouth daily.   CARVEDILOL (COREG) 25 MG TABLET    Take two tablets by mouth every 12 hours for blood pressure   CYANOCOBALAMIN (VITAMIN B-12 PO)    Take 1 tablet by mouth every evening.   DONEPEZIL (ARICEPT) 10 MG TABLET    1 tablet daily to help preserve memory   DOXAZOSIN (CARDURA) 2 MG TABLET    TAKE 1 TABLET DAILY TO REDUCE URINARY FREQUENCY.   ELIQUIS 2.5 MG TABS TABLET    TAKE 1 TABLET TWICE A DAY FOR ANTI COAGULATION.   FERROUS SULFATE 325 (65 FE) MG TABLET    One daily to treat anemia   FUROSEMIDE (LASIX) 40 MG TABLET    TAKE 1 TABLET DAILY TO CONTROL EDEMA     IPRATROPIUM (ATROVENT) 0.03 % NASAL SPRAY    One spray each nostril at dinner   LEVOTHYROXINE (SYNTHROID, LEVOTHROID) 100 MCG TABLET    Take one tablet by mouth once daily for thyroid   OMEGA-3 FATTY ACIDS (FISH OIL PO)    Take 1 tablet by mouth every evening. Takes 200 mg tablet by mouth daily.   OMEPRAZOLE (PRILOSEC) 20 MG CAPSULE    Take 1 capsule (20 mg total) by mouth daily. For stomach   PANTOPRAZOLE (PROTONIX) 40 MG TABLET    Take one tablet by mouth once daily for stomach   POTASSIUM CHLORIDE SA (K-DUR,KLOR-CON) 20 MEQ TABLET    TAKE 1 TABLET ONCE DAILY.   SENNA (SENOKOT) 8.6 MG TABS TABLET    Take 2 tablets by mouth at bedtime.   SPIRONOLACTONE (ALDACTONE) 25 MG TABLET    Take one tablet by mouth nightly   ZOLPIDEM (AMBIEN) 10 MG TABLET    TAKE 1 TABLET AT BEDTIME AS NEEDED FOR SLEEP.  Modified Medications   No medications on file  Discontinued Medications   No medications on file     Review of Systems  Constitutional: Positive for fatigue. Negative for activity change, appetite change and unexpected weight change.  HENT: Positive  for hearing loss and tinnitus. Negative for congestion and ear pain.        Clear nasal drainage associated with eating mainly at dinner time.   Eyes: Negative.   Respiratory: Negative for cough, chest tightness and shortness of breath.        History of dyspnea in the past typically with exertion. He feels he is doing better with his.  Cardiovascular: Negative for chest pain, palpitations and leg swelling.       History of aortic valve replacement. History of pacemaker implantation.  Gastrointestinal: Positive for constipation. Negative for abdominal pain and abdominal distention.       Poor appetite.  Endocrine: Negative.   Genitourinary:       History prostate cancer, urinary frequency, and urgency. He is impotent. He has nocturia x2-3. In the daytime he voids every 90 minutes.  Musculoskeletal: Negative.  Negative for neck pain and neck  stiffness.  Skin: Positive for pallor. Negative for rash and wound.  Neurological: Positive for tremors. Negative for syncope, weakness and numbness.       Memory loss  Hematological: Negative.   Psychiatric/Behavioral: Positive for confusion. The patient is nervous/anxious.     Filed Vitals:   02/15/15 1606  BP: 126/68  Pulse: 75  Temp: 98.4 F (36.9 C)  TempSrc: Oral  Resp: 20  Height: 6\' 1"  (1.854 m)  Weight: 213 lb 6.4 oz (96.798 kg)  SpO2: 99%   Body mass index is 28.16 kg/(m^2).  Physical Exam  Constitutional: He is oriented to person, place, and time. He appears well-developed and well-nourished. No distress.  HENT:  Head: Normocephalic and atraumatic.  Nose: Nose normal.  Mouth/Throat: Oropharynx is clear and moist.  Partial deafness bilaterally.  Eyes: Conjunctivae and EOM are normal. Pupils are equal, round, and reactive to light.  Wears corrective lenses.  Neck: No JVD present. No tracheal deviation present. No thyromegaly present.  Cardiovascular: Normal rate, regular rhythm and intact distal pulses.  Exam reveals no gallop and no friction rub.   2/6 systolic ejection murmur at the base of the heart. Pacemaker left upper anterior chest. Audible systolic click.  Pulmonary/Chest: No respiratory distress. He has no wheezes. He exhibits no tenderness.  Breathing is even and unlabored at rest.  Abdominal: He exhibits no distension and no mass. There is no tenderness.  Musculoskeletal: Normal range of motion. He exhibits no edema or tenderness.  Lymphadenopathy:    He has no cervical adenopathy.  Neurological: He is alert and oriented to person, place, and time. No cranial nerve deficit. Coordination normal.  Resting tremor bilaterally. Worse on the right side. 01/17/2009 MMSE 25/30. Passed clock drawing. 10/26/14 MMSE 23/30. Passed clock drawing.  Skin: No rash noted. No erythema. No pallor.  Multiple seborrheic keratoses. Common Nevus left neck posterior to the  ear.  Psychiatric: He has a normal mood and affect. His behavior is normal. Judgment and thought content normal.     Labs reviewed: Abstract on 02/09/2015  Component Date Value Ref Range Status  . Glucose 02/08/2015 92   Final  . BUN 02/08/2015 28* 4 - 21 mg/dL Final  . Creatinine 02/08/2015 1.9* 0.6 - 1.3 mg/dL Final  . Potassium 02/08/2015 4.6  3.4 - 5.3 mmol/L Final  . Sodium 02/08/2015 135* 137 - 147 mmol/L Final  . Hgb A1c MFr Bld 02/08/2015 5.9  4.0 - 6.0 % Final  . TSH 02/08/2015 4.42  0.41 - 5.90 uIU/mL Final     Assessment/Plan  1. Memory loss  Unchanged, but he is calmer  2. Essential hypertension controlled  3. Chronic diastolic congestive heart failure compensated  4. Atrial fibrillation   Controlled rate. On Eliquis.  5. Tremor unchanged  6. Mixed hyperlipidemia -lipids, future  7. Hypothyroidism, unspecified hypothyroidism type Compensated -TSH, future  8. Chronic kidney disease, stage IV (severe) -CMP, FUTURE  9. Vasomotor rhinitis Encouraged him to just live with this now.

## 2015-02-19 ENCOUNTER — Other Ambulatory Visit: Payer: Self-pay | Admitting: Internal Medicine

## 2015-02-26 ENCOUNTER — Other Ambulatory Visit: Payer: Self-pay | Admitting: Internal Medicine

## 2015-02-27 ENCOUNTER — Other Ambulatory Visit: Payer: Self-pay | Admitting: Internal Medicine

## 2015-03-14 ENCOUNTER — Encounter: Payer: Self-pay | Admitting: *Deleted

## 2015-03-16 ENCOUNTER — Encounter: Payer: Self-pay | Admitting: Internal Medicine

## 2015-03-16 ENCOUNTER — Ambulatory Visit (INDEPENDENT_AMBULATORY_CARE_PROVIDER_SITE_OTHER): Payer: Medicare Other | Admitting: Internal Medicine

## 2015-03-16 VITALS — BP 134/72 | HR 84 | Temp 98.3°F | Resp 20 | Ht 73.0 in | Wt 211.6 lb

## 2015-03-16 DIAGNOSIS — H539 Unspecified visual disturbance: Secondary | ICD-10-CM

## 2015-03-16 DIAGNOSIS — I4891 Unspecified atrial fibrillation: Secondary | ICD-10-CM | POA: Diagnosis not present

## 2015-03-16 DIAGNOSIS — I5032 Chronic diastolic (congestive) heart failure: Secondary | ICD-10-CM

## 2015-03-16 DIAGNOSIS — Z952 Presence of prosthetic heart valve: Secondary | ICD-10-CM

## 2015-03-16 DIAGNOSIS — R413 Other amnesia: Secondary | ICD-10-CM

## 2015-03-16 DIAGNOSIS — E039 Hypothyroidism, unspecified: Secondary | ICD-10-CM

## 2015-03-16 DIAGNOSIS — Z954 Presence of other heart-valve replacement: Secondary | ICD-10-CM

## 2015-03-16 DIAGNOSIS — Z950 Presence of cardiac pacemaker: Secondary | ICD-10-CM | POA: Diagnosis not present

## 2015-03-16 DIAGNOSIS — I2510 Atherosclerotic heart disease of native coronary artery without angina pectoris: Secondary | ICD-10-CM

## 2015-03-16 LAB — HM DIABETES EYE EXAM

## 2015-03-16 NOTE — Progress Notes (Signed)
Patient ID: JAMAS JAQUAY, male   DOB: [birth year], 81 y.o.   MRN: [id number]    Location:    [facility]   Place of Service:  OFFICE   Chief Complaint  Patient presents with  . Acute Visit    had episode at dinnertime where he could only see darkness and then it became lightagin    HPI:  81 yo male with hx AS s/p AVR at [facility], HTN, afib s/p pacer, and chronic diastolic HF seen today for change in vision. He saw eye specialist this AM. Dr Zenia Resides exam was benign and he was told to f/u with PCP. Episode last night lasted for 10 min - blurring in eyes with black and white mixture. No dizziness, syncope. No sensation of curtain closing down eyes. No HA, neck pain/stiffness, no weakness, facial droop, falls. No change in urinary/bowel habits. No CP, SOB, palpitations.  Past Medical History  Diagnosis Date  . Hypertension   . Hyperlipidemia   . Aortic stenosis     Moderately Severe by echo 07/2011  . 1St degree AV block   . Shortness of breath   . Angina   . Chronic kidney disease     renal insufficiency  . Pleural effusion     07/2011 - Left > Right - tapped (L) - Transudative  . Atrial fibrillation     07/2011 - amio and Rivaroxaban initiated - tee/dccv  . Diastolic CHF, chronic     07/2011 NL EF by echo  . Orthostatic hypotension   . Coronary artery disease 01/19/2012  . Cardiac pacemaker in situ 06/01/2012  . Unspecified hypothyroidism 09/24/2011  . Anxiety state, unspecified 09/24/2011  . Unspecified cataract 02/04/2011  . Disturbance of salivary secretion 07/25/2009  . Edema 09/05/2008  . Shortness of breath 09/05/2008  . 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 Insomnia, unspecified 05/09/2005  . Urinary frequency 05/09/2005  . Malignant neoplasm of prostate 03/04/2005  . Hypertrophy of prostate without urinary obstruction and other lower urinary tract symptoms (LUTS) 02/12/2005  . Other malaise and fatigue 02/12/2005  . Nocturia 02/22/2003  . Disturbances of sensation of smell  and taste 03/16/2001  . Other specified cardiac dysrhythmias(427.89) 11/19/1999  . Anemia, unspecified   . Deaf   . Tremor 10/29/2013  . Memory loss 10/29/2013  . Hyperglycemia     Past Surgical History  Procedure Laterality Date  . Tee without cardioversion  07/17/2011    Procedure: TRANSESOPHAGEAL ECHOCARDIOGRAM (TEE);  Surgeon: Lelon Perla, MD;  Location: [facility] ENDOSCOPY;  Service: Cardiovascular;  Laterality: N/A;  With Cardioversion  . Hemorroidectomy  1968  . Spine surgery  1993    HNP(RUPTURED DISC)[name]  . Prostate surgery  05/26/2005    Dr. Terance Hart  . Eye surgery  2008    left eye ( Dr. Charise Killian)  . Esophagogastroduodenoscopy N/A 04/15/2013    Procedure: ESOPHAGOGASTRODUODENOSCOPY (EGD);  Surgeon: Cleotis Nipper, MD;  Location: [facility] ENDOSCOPY;  Service: Endoscopy;  Laterality: N/A;  . Esophagogastroduodenoscopy N/A 04/18/2013    Procedure: ESOPHAGOGASTRODUODENOSCOPY (EGD);  Surgeon: Wonda Horner, MD;  Location: [facility] ENDOSCOPY;  Service: Endoscopy;  Laterality: N/A;    Patient Care Team: Estill Dooms, MD as PCP - General (Internal Medicine) Anson Crofts, MD as Attending Physician (Cardiothoracic Surgery)  Social History   Social History  . Marital Status: Married    Spouse Name: N/A  . Number of Children: N/A  . Years of Education: N/A   Occupational History  .  Not on file.   Social History Main Topics  . Smoking status: Never Smoker   . Smokeless tobacco: Never Used  . Alcohol Use: No  . Drug Use: No  . Sexual Activity: No   Other Topics Concern  . Not on file   Social History Narrative   Lives at [facility] since 08/26/13   Married Romie Minus   Exercise walking daily with cane   Never smoke   Hard of hearing              reports that he has never smoked. He has never used smokeless tobacco. He reports that he does not drink alcohol or use illicit drugs.  Allergies  Allergen Reactions  . Mold Extract [Trichophyton]      Medications: Patient's Medications  New Prescriptions   No medications on file  Previous Medications   ALPRAZOLAM (XANAX) 0.25 MG TABLET    TAKE 1 TABLET UP TO 3 TIMES A DAY AS NEEDED FOR NERVES.   AMIODARONE (PACERONE) 200 MG TABLET    Take one tablet by mouth once daily   AMLODIPINE (NORVASC) 10 MG TABLET    Take one tablet by mouth once daily for blood pressure   ATORVASTATIN (LIPITOR) 20 MG TABLET    Take one tablet by mouth once daily for cholesterol   BISACODYL (DULCOLAX) 5 MG EC TABLET    Take 5 mg by mouth daily.   CARVEDILOL (COREG) 25 MG TABLET    Take two tablets by mouth every 12 hours for blood pressure   CYANOCOBALAMIN (VITAMIN B-12 PO)    Take 1 tablet by mouth every evening.   DONEPEZIL (ARICEPT) 10 MG TABLET    TAKE 1 TABLET ONCE DAILY FOR MEMORY.   DOXAZOSIN (CARDURA) 2 MG TABLET    TAKE 1 TABLET DAILY TO REDUCE URINARY FREQUENCY.   ELIQUIS 2.5 MG TABS TABLET    TAKE 1 TABLET TWICE A DAY FOR ANTI COAGULATION.   FERROUS SULFATE 325 (65 FE) MG TABLET    One daily to treat anemia   FUROSEMIDE (LASIX) 40 MG TABLET    TAKE 1 TABLET DAILY TO CONTROL EDEMA   IPRATROPIUM (ATROVENT) 0.03 % NASAL SPRAY    One spray each nostril at dinner   LEVOTHYROXINE (SYNTHROID, LEVOTHROID) 100 MCG TABLET    Take one tablet by mouth once daily for thyroid   OMEGA-3 FATTY ACIDS (FISH OIL PO)    Take 1 tablet by mouth every evening. Takes 200 mg tablet by mouth daily.   OMEPRAZOLE (PRILOSEC) 20 MG CAPSULE    Take 1 capsule (20 mg total) by mouth daily. For stomach   PANTOPRAZOLE (PROTONIX) 40 MG TABLET    Take one tablet by mouth once daily for stomach   POTASSIUM CHLORIDE SA (K-DUR,KLOR-CON) 20 MEQ TABLET    TAKE 1 TABLET ONCE DAILY.   SENNA (SENOKOT) 8.6 MG TABS TABLET    Take 2 tablets by mouth at bedtime.   SPIRONOLACTONE (ALDACTONE) 25 MG TABLET    Take one tablet by mouth nightly   ZOLPIDEM (AMBIEN) 10 MG TABLET    TAKE 1 TABLET AT BEDTIME AS NEEDED FOR SLEEP.  Modified Medications    No medications on file  Discontinued Medications   No medications on file    Review of Systems  Unable to perform ROS: Dementia    Filed Vitals:   03/16/15 1406  BP: 134/72  Pulse: 84  Temp: 98.3 F (36.8 C)  TempSrc: Oral  Resp: 20  Height: 6\' 1"  (1.854 m)  Weight: 211 lb 9.6 oz (95.981 kg)  SpO2: 95%   Body mass index is 27.92 kg/(m^2).  Physical Exam  Constitutional: He appears well-developed and well-nourished. No distress.  HENT:  Head: Normocephalic and atraumatic. Head is without raccoon's eyes, without abrasion and without contusion.  Right Ear: Tympanic membrane, external ear and ear canal normal.  Left Ear: Tympanic membrane, external ear and ear canal normal.  Mouth/Throat: Oropharynx is clear and moist.  Eyes: Pupils are equal, round, and reactive to light. No scleral icterus.  Abnormal accomodation - pt experiences diplopia  Neck: Neck supple. Carotid bruit is not present. No thyromegaly present.  Cardiovascular: Normal rate, regular rhythm and intact distal pulses.  Exam reveals no gallop and no friction rub.   Murmur (1/6 SEM) heard. no distal LE swelling. No calf TTP  Pulmonary/Chest: Effort normal and breath sounds normal. He has no wheezes. He has no rales. He exhibits no tenderness.  Abdominal: Soft. Bowel sounds are normal. He exhibits no distension, no abdominal bruit, no pulsatile midline mass and no mass. There is no tenderness. There is no rebound and no guarding.  Musculoskeletal: He exhibits edema.  Lymphadenopathy:    He has no cervical adenopathy.  Neurological: He is alert. He has normal strength and normal reflexes. No cranial nerve deficit or sensory deficit. Gait abnormal. Coordination normal.  Neg cerebellar signs. Intentional and resting tremors. Finger-to-nose intact  Skin: Skin is warm and dry. No rash noted.  Psychiatric: He has a normal mood and affect. His behavior is normal. Thought content normal.     Labs reviewed: Abstract on  02/09/2015  Component Date Value Ref Range Status  . Glucose 02/08/2015 92   Final  . BUN 02/08/2015 28* 4 - 21 mg/dL Final  . Creatinine 02/08/2015 1.9* 0.6 - 1.3 mg/dL Final  . Potassium 02/08/2015 4.6  3.4 - 5.3 mmol/L Final  . Sodium 02/08/2015 135* 137 - 147 mmol/L Final  . Hgb A1c MFr Bld 02/08/2015 5.9  4.0 - 6.0 % Final  . TSH 02/08/2015 4.42  0.41 - 5.90 uIU/mL Final    No results found.   Assessment/Plan   ICD-9-CM ICD-10-CM   1. Change in vision - with abnormal accomodation reflex 368.9 H53.9 CBC with Differential     CMP     TSH  2. Atrial fibrillation  - rate controlled 427.31 I48.91 CBC with Differential     CMP  3. Chronic diastolic congestive heart failure - stable 428.32 I50.32 CBC with Differential   428.0    4. Memory loss - stable 780.93 R41.3   5. Pacemaker-[employer] V45.01 Z95.0   6. S/P aortic valve replacement V43.3 Z95.4   7. Hypothyroidism, unspecified hypothyroidism type  244.9 E03.9 TSH    C/a stroke vs TIA but he declined head CT today  If symptoms return, go to the ER immediately  Continue current medications as ordered  Follow up with Dr Nyoka Cowden as scheduled  Cordella Register. Perlie Gold  [facility] [address] [place], [place] [address] [phone] Cell (Monday-Friday 8 AM - 5 PM) [phone] After 5 PM and follow prompts

## 2015-03-16 NOTE — Patient Instructions (Signed)
If symptoms return, go to the ER immediately  Continue current medications as ordered  Will call with lab results  Follow up with Dr Nyoka Cowden as scheduled

## 2015-03-17 LAB — COMPREHENSIVE METABOLIC PANEL
ALT: 39 IU/L (ref 0–44)
AST: 28 IU/L (ref 0–40)
Albumin/Globulin Ratio: 1.8 (ref 1.1–2.5)
Albumin: 4.6 g/dL (ref 3.5–4.7)
Alkaline Phosphatase: 47 IU/L (ref 39–117)
BUN/Creatinine Ratio: 12 (ref 10–22)
BUN: 26 mg/dL (ref 8–27)
Bilirubin Total: 0.7 mg/dL (ref 0.0–1.2)
CALCIUM: 9.5 mg/dL (ref 8.6–10.2)
CHLORIDE: 95 mmol/L — AB (ref 97–108)
CO2: 26 mmol/L (ref 18–29)
CREATININE: 2.13 mg/dL — AB (ref 0.76–1.27)
GFR, EST AFRICAN AMERICAN: 31 mL/min/{1.73_m2} — AB (ref >59)
GFR, EST NON AFRICAN AMERICAN: 27 mL/min/{1.73_m2} — AB (ref >59)
GLUCOSE: 106 mg/dL — AB (ref 65–99)
Globulin, Total: 2.5 g/dL (ref 1.5–4.5)
Potassium: 4.8 mmol/L (ref 3.5–5.2)
Sodium: 140 mmol/L (ref 134–144)
TOTAL PROTEIN: 7.1 g/dL (ref 6.0–8.5)

## 2015-03-17 LAB — CBC WITH DIFFERENTIAL/PLATELET
BASOS ABS: 0 10*3/uL (ref 0.0–0.2)
BASOS: 1 %
EOS (ABSOLUTE): 0 10*3/uL (ref 0.0–0.4)
Eos: 1 %
HEMOGLOBIN: 14.5 g/dL (ref 12.6–17.7)
Hematocrit: 43.9 % (ref 37.5–51.0)
IMMATURE GRANS (ABS): 0 10*3/uL (ref 0.0–0.1)
IMMATURE GRANULOCYTES: 0 %
LYMPHS: 20 %
Lymphocytes Absolute: 1.3 10*3/uL (ref 0.7–3.1)
MCH: 29.5 pg (ref 26.6–33.0)
MCHC: 33 g/dL (ref 31.5–35.7)
MCV: 89 fL (ref 79–97)
MONOCYTES: 8 %
Monocytes Absolute: 0.5 10*3/uL (ref 0.1–0.9)
NEUTROS PCT: 70 %
Neutrophils Absolute: 4.5 10*3/uL (ref 1.4–7.0)
PLATELETS: 155 10*3/uL (ref 150–379)
RBC: 4.91 x10E6/uL (ref 4.14–5.80)
RDW: 14 % (ref 12.3–15.4)
WBC: 6.4 10*3/uL (ref 3.4–10.8)

## 2015-03-17 LAB — TSH: TSH: 4.08 u[IU]/mL (ref 0.450–4.500)

## 2015-03-21 ENCOUNTER — Other Ambulatory Visit: Payer: Self-pay | Admitting: Internal Medicine

## 2015-03-22 ENCOUNTER — Other Ambulatory Visit: Payer: Self-pay | Admitting: Internal Medicine

## 2015-04-04 ENCOUNTER — Other Ambulatory Visit: Payer: Self-pay | Admitting: Internal Medicine

## 2015-04-11 ENCOUNTER — Encounter: Payer: Self-pay | Admitting: *Deleted

## 2015-04-18 ENCOUNTER — Other Ambulatory Visit: Payer: Self-pay | Admitting: Nurse Practitioner

## 2015-04-18 ENCOUNTER — Other Ambulatory Visit: Payer: Self-pay | Admitting: Internal Medicine

## 2015-05-10 ENCOUNTER — Other Ambulatory Visit: Payer: Self-pay | Admitting: Internal Medicine

## 2015-05-15 ENCOUNTER — Encounter: Payer: Self-pay | Admitting: *Deleted

## 2015-06-01 ENCOUNTER — Other Ambulatory Visit: Payer: Self-pay | Admitting: Internal Medicine

## 2015-06-11 ENCOUNTER — Encounter: Payer: Self-pay | Admitting: *Deleted

## 2015-06-11 ENCOUNTER — Other Ambulatory Visit: Payer: Self-pay | Admitting: Internal Medicine

## 2015-06-14 LAB — HEPATIC FUNCTION PANEL
ALT: 69 U/L — AB (ref 10–40)
AST: 53 U/L — AB (ref 14–40)
Alkaline Phosphatase: 43 U/L (ref 25–125)
BILIRUBIN, TOTAL: 0.8 mg/dL

## 2015-06-14 LAB — CBC AND DIFFERENTIAL
HEMATOCRIT: 40 % — AB (ref 41–53)
HEMOGLOBIN: 13.2 g/dL — AB (ref 13.5–17.5)
PLATELETS: 131 10*3/uL — AB (ref 150–399)
WBC: 9 10^3/mL

## 2015-06-14 LAB — BASIC METABOLIC PANEL
BUN: 23 mg/dL — AB (ref 4–21)
CREATININE: 1.6 mg/dL — AB (ref 0.6–1.3)
GLUCOSE: 103 mg/dL
POTASSIUM: 4.8 mmol/L (ref 3.4–5.3)
Sodium: 133 mmol/L — AB (ref 137–147)

## 2015-06-14 LAB — TSH: TSH: 3.77 u[IU]/mL (ref 0.41–5.90)

## 2015-06-14 LAB — HEMOGLOBIN A1C: Hgb A1c MFr Bld: 6 % (ref 4.0–6.0)

## 2015-06-18 ENCOUNTER — Encounter: Payer: Self-pay | Admitting: *Deleted

## 2015-06-18 ENCOUNTER — Other Ambulatory Visit: Payer: Self-pay | Admitting: Internal Medicine

## 2015-06-21 ENCOUNTER — Non-Acute Institutional Stay (INDEPENDENT_AMBULATORY_CARE_PROVIDER_SITE_OTHER): Payer: Medicare Other | Admitting: Internal Medicine

## 2015-06-21 ENCOUNTER — Encounter: Payer: Self-pay | Admitting: Internal Medicine

## 2015-06-21 VITALS — BP 122/68 | HR 60 | Temp 97.8°F | Ht 73.0 in | Wt 212.0 lb

## 2015-06-21 DIAGNOSIS — I5032 Chronic diastolic (congestive) heart failure: Secondary | ICD-10-CM

## 2015-06-21 DIAGNOSIS — E039 Hypothyroidism, unspecified: Secondary | ICD-10-CM | POA: Diagnosis not present

## 2015-06-21 DIAGNOSIS — Z23 Encounter for immunization: Secondary | ICD-10-CM

## 2015-06-21 DIAGNOSIS — N184 Chronic kidney disease, stage 4 (severe): Secondary | ICD-10-CM | POA: Diagnosis not present

## 2015-06-21 DIAGNOSIS — I2510 Atherosclerotic heart disease of native coronary artery without angina pectoris: Secondary | ICD-10-CM | POA: Diagnosis not present

## 2015-06-21 DIAGNOSIS — E782 Mixed hyperlipidemia: Secondary | ICD-10-CM

## 2015-06-21 DIAGNOSIS — I10 Essential (primary) hypertension: Secondary | ICD-10-CM | POA: Diagnosis not present

## 2015-06-21 DIAGNOSIS — R413 Other amnesia: Secondary | ICD-10-CM

## 2015-06-21 DIAGNOSIS — D649 Anemia, unspecified: Secondary | ICD-10-CM

## 2015-06-21 DIAGNOSIS — I4891 Unspecified atrial fibrillation: Secondary | ICD-10-CM | POA: Diagnosis not present

## 2015-06-21 DIAGNOSIS — I2583 Coronary atherosclerosis due to lipid rich plaque: Secondary | ICD-10-CM

## 2015-06-21 NOTE — Progress Notes (Signed)
Patient ID: Barry Thompson, male   DOB: [birth year], 81 y.o.   MRN: [id number]    [facility]    Place of Service:   OFFICE    Allergies  Allergen Reactions  . Mold Extract [Trichophyton]     Chief Complaint  Patient presents with  . Medical Management of Chronic Issues    Blood pressure, memory, CHF, A-Fib, thyroid, CKD    HPI:  Encounter for immunization - flu vaccine  Atrial fibrillation  - controlled rate. Anticoagulated with 6 bands  Coronary artery disease due to lipid rich plaque - denies chest pain  Chronic diastolic congestive heart failure (HCC) - mild shortness of breath with exertion. 1-2+ bipedal edema.  Chronic kidney disease, stage IV (severe) (HCC) - stable  Essential hypertension - controlled  Hypothyroidism, unspecified hypothyroidism type - compensated  Memory loss - unchanged  Mixed hyperlipidemia - satisfactorily controlled  Anemia, unspecified anemia type - resolved    Medications: Patient's Medications  New Prescriptions   No medications on file  Previous Medications   ALPRAZOLAM (XANAX) 0.25 MG TABLET    TAKE 1 TABLET UP TO 3 TIMES A DAY AS NEEDED FOR NERVES.   AMIODARONE (PACERONE) 200 MG TABLET    TAKE 1 TABLET ONCE DAILY.   AMLODIPINE (NORVASC) 10 MG TABLET    TAKE (1) TABLET DAILY FOR HIGH BLOOD PRESSURE.   ATORVASTATIN (LIPITOR) 20 MG TABLET    Take one tablet by mouth once daily for cholesterol   BISACODYL (DULCOLAX) 5 MG EC TABLET    Take 5 mg by mouth daily.   CARVEDILOL (COREG) 25 MG TABLET    TAKE 2 TABLETS BY MOUTH EVERY 12 HOURS FOR BLOOD PRESSURE.   CYANOCOBALAMIN (VITAMIN B-12 PO)    Take 1 tablet by mouth every evening.   DONEPEZIL (ARICEPT) 10 MG TABLET    TAKE 1 TABLET ONCE DAILY FOR MEMORY.   DOXAZOSIN (CARDURA) 2 MG TABLET    TAKE 1 TABLET DAILY TO REDUCE URINARY FREQUENCY.   ELIQUIS 2.5 MG TABS TABLET    TAKE 1 TABLET TWICE A DAY FOR ANTI COAGULATION.   FERROUS SULFATE 325 (65 FE) MG TABLET     One daily to treat anemia   FUROSEMIDE (LASIX) 40 MG TABLET    TAKE 1 TABLET ONCE DAILY TO CONTROL EDEMA.   IPRATROPIUM (ATROVENT) 0.03 % NASAL SPRAY    One spray each nostril at dinner   LEVOTHYROXINE (SYNTHROID, LEVOTHROID) 100 MCG TABLET    TAKE 1 TABLET DAILY FOR THYROID.   OMEGA-3 FATTY ACIDS (FISH OIL PO)    Take 1 tablet by mouth every evening. Takes 200 mg tablet by mouth daily.   OMEPRAZOLE (PRILOSEC) 20 MG CAPSULE    Take 1 capsule (20 mg total) by mouth daily. For stomach   PANTOPRAZOLE (PROTONIX) 40 MG TABLET    Take one tablet by mouth once daily for stomach   POTASSIUM CHLORIDE SA (K-DUR,KLOR-CON) 20 MEQ TABLET    TAKE 1 TABLET ONCE DAILY.   SENNA (SENOKOT) 8.6 MG TABS TABLET    Take 2 tablets by mouth at bedtime.   SPIRONOLACTONE (ALDACTONE) 25 MG TABLET    TAKE 1 TABLET BY MOUTH NIGHTLY   ZOLPIDEM (AMBIEN) 10 MG TABLET    TAKE 1 TABLET AT BEDTIME AS NEEDED FOR SLEEP.  Modified Medications   No medications on file  Discontinued Medications   No medications on file    Review of Systems  Constitutional: Positive for fatigue. Negative for  activity change, appetite change and unexpected weight change.  HENT: Positive for hearing loss and tinnitus. Negative for congestion and ear pain.        Clear nasal drainage associated with eating mainly at dinner time.   Eyes: Negative.   Respiratory: Negative for cough, chest tightness and shortness of breath.        History of dyspnea in the past typically with exertion. He feels he is doing better with his.  Cardiovascular: Negative for chest pain, palpitations and leg swelling.       History of aortic valve replacement. History of pacemaker implantation.  Gastrointestinal: Positive for constipation. Negative for abdominal pain and abdominal distention.       Poor appetite.  Endocrine: Negative.   Genitourinary:       History prostate cancer, urinary frequency, and urgency. He is impotent. He has nocturia x2-3. In the daytime he voids  every 90 minutes.  Musculoskeletal: Negative.  Negative for neck pain and neck stiffness.  Skin: Positive for pallor. Negative for rash and wound.  Neurological: Positive for tremors. Negative for syncope, weakness and numbness.       Memory loss  Hematological: Negative.   Psychiatric/Behavioral: Positive for confusion. The patient is nervous/anxious.     Filed Vitals:   06/21/15 1402  BP: 122/68  Pulse: 60  Temp: 97.8 F (36.6 C)  TempSrc: Oral  Height: $Remove'6\' 1"'vTggGqF$  (1.854 m)  Weight: 212 lb (96.163 kg)  SpO2: 99%   Body mass index is 27.98 kg/(m^2).  Physical Exam  Constitutional: He is oriented to person, place, and time. He appears well-developed and well-nourished. No distress.  HENT:  Head: Normocephalic and atraumatic.  Nose: Nose normal.  Mouth/Throat: Oropharynx is clear and moist.  Partial deafness bilaterally.  Eyes: Conjunctivae and EOM are normal. Pupils are equal, round, and reactive to light.  Wears corrective lenses.  Neck: No JVD present. No tracheal deviation present. No thyromegaly present.  Cardiovascular: Normal rate, regular rhythm and intact distal pulses.  Exam reveals no gallop and no friction rub.   2/6 systolic ejection murmur at the base of the heart. Pacemaker left upper anterior chest. Audible systolic click.  Pulmonary/Chest: No respiratory distress. He has no wheezes. He exhibits no tenderness.  Breathing is even and unlabored at rest.  Abdominal: He exhibits no distension and no mass. There is no tenderness.  Musculoskeletal: Normal range of motion. He exhibits no edema or tenderness.  Lymphadenopathy:    He has no cervical adenopathy.  Neurological: He is alert and oriented to person, place, and time. No cranial nerve deficit. Coordination normal.  Resting tremor bilaterally. Worse on the right side. 01/17/2009 MMSE 25/30. Passed clock drawing. 10/26/14 MMSE 23/30. Passed clock drawing.  Skin: No rash noted. No erythema. No pallor.  Multiple  seborrheic keratoses. Common Nevus left neck posterior to the ear.  Psychiatric: He has a normal mood and affect. His behavior is normal. Judgment and thought content normal.    Labs reviewed: Lab Summary Latest Ref Rng 06/14/2015 03/16/2015 02/08/2015 10/19/2014  Hemoglobin 13.5 - 17.5 g/dL 13.2(A) 14.5 (None) (None)  Hematocrit 41 - 53 % 40(A) 43.9 (None) (None)  White count - 9.0 6.4 (None) (None)  Platelet count 150 - 399 K/L 131(A) 155 (None) (None)  Sodium 137 - 147 mmol/L 133(A) 140 135(A) 139  Potassium 3.4 - 5.3 mmol/L 4.8 4.8 4.6 4.4  Calcium 8.6 - 10.2 mg/dL (None) 9.5 (None) (None)  Phosphorus - (None) (None) (None) (None)  Creatinine 0.6 -  1.3 mg/dL 1.6(A) 2.13(H) 1.9(A) 2.0(A)  AST 14 - 40 U/L 53(A) 28 (None) 24  Alk Phos 25 - 125 U/L 43 47 (None) 40  Bilirubin 0.0 - 1.2 mg/dL (None) 0.7 (None) (None)  Glucose - 103 106(H) 92 97  Cholesterol 0 - 200 mg/dL (None) (None) (None) 127  HDL cholesterol 35 - 70 mg/dL (None) (None) (None) 32(A)  Triglycerides 40 - 160 mg/dL (None) (None) (None) 138  LDL Direct - (None) (None) (None) (None)  LDL Calc - (None) (None) (None) 67  Total protein - (None) (None) (None) (None)  Albumin 3.5 - 4.7 g/dL (None) 4.6 (None) (None)   Lab Results  Component Value Date   TSH 3.77 06/14/2015   Lab Results  Component Value Date   BUN 23* 06/14/2015   Lab Results  Component Value Date   HGBA1C 6.0 06/14/2015    Assessment/Plan  1. Encounter for immunization Flu vaccine given  2. Atrial fibrillation   Controlled rate and anticoagulated  3. Coronary artery disease due to lipid rich plaque Stable without chest discomfort  4. Chronic diastolic congestive heart failure (HCC) Mild shortness of breath and pedal edema. Compensated.  5. Chronic kidney disease, stage IV (severe) (HCC) Controlled -CMP, future  6. Essential hypertension Controlled -CMP, future  7. Hypothyroidism, unspecified hypothyroidism type Compensated -TSH,  future  8. Memory loss Unchanged  9. Mixed hyperlipidemia Controlled  10. Anemia, unspecified anemia type Resolved. Discontinue ferrous sulfate.

## 2015-07-02 ENCOUNTER — Other Ambulatory Visit: Payer: Self-pay | Admitting: Internal Medicine

## 2015-07-05 ENCOUNTER — Encounter: Payer: Self-pay | Admitting: Internal Medicine

## 2015-07-05 ENCOUNTER — Non-Acute Institutional Stay: Payer: Medicare Other | Admitting: Internal Medicine

## 2015-07-05 VITALS — BP 136/78 | HR 78 | Temp 97.8°F | Ht 73.0 in | Wt 212.8 lb

## 2015-07-05 DIAGNOSIS — S51809A Unspecified open wound of unspecified forearm, initial encounter: Secondary | ICD-10-CM | POA: Insufficient documentation

## 2015-07-05 DIAGNOSIS — I2510 Atherosclerotic heart disease of native coronary artery without angina pectoris: Secondary | ICD-10-CM | POA: Diagnosis not present

## 2015-07-05 DIAGNOSIS — S51802A Unspecified open wound of left forearm, initial encounter: Secondary | ICD-10-CM | POA: Diagnosis not present

## 2015-07-05 NOTE — Progress Notes (Signed)
Patient ID: Barry Thompson, male   DOB: 03-15-1924, 81 y.o.   MRN: [id number]    [place] of Service: Clinic (12)     Allergies  Allergen Reactions  . Mold Extract [Trichophyton]     Chief Complaint  Patient presents with  . Acute Visit    Patient fell up against wall and scraped Left arm. Wife cleaned the area with alchol and put Mupirocin 2% ointment on it. This morning it was swollen and red.    HPI:  Scanned. Yesterday. After a fall. Not much pain. Left forearm involved. Steri-Stripped with 5 Steri-Strips. Small amount of hemorrhage underneath the skin. No evidence of infection. No other injuries.  Medications: Patient's Medications  New Prescriptions   No medications on file  Previous Medications   ALPRAZOLAM (XANAX) 0.25 MG TABLET    TAKE 1 TABLET UP TO 3 TIMES A DAY AS NEEDED FOR NERVES.   AMIODARONE (PACERONE) 200 MG TABLET    TAKE 1 TABLET ONCE DAILY.   AMLODIPINE (NORVASC) 10 MG TABLET    TAKE (1) TABLET DAILY FOR HIGH BLOOD PRESSURE.   ATORVASTATIN (LIPITOR) 20 MG TABLET    Take one tablet by mouth once daily for cholesterol   BISACODYL (DULCOLAX) 5 MG EC TABLET    Take 5 mg by mouth daily.   CARVEDILOL (COREG) 25 MG TABLET    TAKE 2 TABLETS BY MOUTH EVERY 12 HOURS FOR BLOOD PRESSURE.   CYANOCOBALAMIN (VITAMIN B-12 PO)    Take 1 tablet by mouth every evening.   DONEPEZIL (ARICEPT) 10 MG TABLET    TAKE 1 TABLET ONCE DAILY FOR MEMORY.   DOXAZOSIN (CARDURA) 2 MG TABLET    TAKE 1 TABLET DAILY TO REDUCE URINARY FREQUENCY.   ELIQUIS 2.5 MG TABS TABLET    TAKE 1 TABLET TWICE A DAY FOR ANTI COAGULATION.   FUROSEMIDE (LASIX) 40 MG TABLET    TAKE 1 TABLET ONCE DAILY TO CONTROL EDEMA.   IPRATROPIUM (ATROVENT) 0.03 % NASAL SPRAY    One spray each nostril at dinner   LEVOTHYROXINE (SYNTHROID, LEVOTHROID) 100 MCG TABLET    TAKE 1 TABLET DAILY FOR THYROID.   MUPIROCIN OINTMENT (BACTROBAN) 2 %    Apply as directed to arm   OMEGA-3 FATTY ACIDS (FISH  OIL PO)    Take 1 tablet by mouth every evening. Takes 200 mg tablet by mouth daily.   OMEPRAZOLE (PRILOSEC) 20 MG CAPSULE    Take 1 capsule (20 mg total) by mouth daily. For stomach   PANTOPRAZOLE (PROTONIX) 40 MG TABLET    Take one tablet by mouth once daily for stomach   POTASSIUM CHLORIDE SA (K-DUR,KLOR-CON) 20 MEQ TABLET    TAKE 1 TABLET ONCE DAILY.   SENNA (SENOKOT) 8.6 MG TABS TABLET    Take 2 tablets by mouth at bedtime.   SPIRONOLACTONE (ALDACTONE) 25 MG TABLET    TAKE 1 TABLET BY MOUTH NIGHTLY   ZOLPIDEM (AMBIEN) 10 MG TABLET    TAKE 1 TABLET AT BEDTIME AS NEEDED FOR SLEEP.  Modified Medications   No medications on file  Discontinued Medications   No medications on file     Review of Systems  Constitutional: Positive for fatigue. Negative for activity change, appetite change and unexpected weight change.  HENT: Positive for hearing loss and tinnitus. Negative for congestion and ear pain.        Clear nasal drainage associated with eating mainly at dinner time.   Eyes:  Negative.   Respiratory: Negative for cough, chest tightness and shortness of breath.        History of dyspnea in the past typically with exertion. He feels he is doing better with his.  Cardiovascular: Negative for chest pain, palpitations and leg swelling.       History of aortic valve replacement. History of pacemaker implantation.  Gastrointestinal: Positive for constipation. Negative for abdominal pain and abdominal distention.       Poor appetite.  Endocrine: Negative.   Genitourinary:       History prostate cancer, urinary frequency, and urgency. He is impotent. He has nocturia x2-3. In the daytime he voids every 90 minutes.  Musculoskeletal: Negative.  Negative for neck pain and neck stiffness.  Skin: Positive for pallor. Negative for rash and wound.       New skin tear left forearm appears more irritated according to wife and patient.  Neurological: Positive for tremors. Negative for syncope, weakness  and numbness.       Memory loss  Hematological: Negative.   Psychiatric/Behavioral: Positive for confusion. The patient is nervous/anxious.     Filed Vitals:   07/05/15 1506  BP: 136/78  Pulse: 78  Temp: 97.8 F (36.6 C)  TempSrc: Oral  Height: '6\' 1"'$  (1.854 m)  Weight: 212 lb 12.8 oz (96.525 kg)   Wt Readings from Last 3 Encounters:  07/05/15 212 lb 12.8 oz (96.525 kg)  06/21/15 212 lb (96.163 kg)  03/16/15 211 lb 9.6 oz (95.981 kg)    Body mass index is 28.08 kg/(m^2).  Physical Exam  Constitutional: He is oriented to person, place, and time. He appears well-developed and well-nourished. No distress.  HENT:  Head: Normocephalic and atraumatic.  Nose: Nose normal.  Mouth/Throat: Oropharynx is clear and moist.  Partial deafness bilaterally.  Eyes: Conjunctivae and EOM are normal. Pupils are equal, round, and reactive to light.  Wears corrective lenses.  Neck: No JVD present. No tracheal deviation present. No thyromegaly present.  Cardiovascular: Normal rate, regular rhythm and intact distal pulses.  Exam reveals no gallop and no friction rub.   2/6 systolic ejection murmur at the base of the heart. Pacemaker left upper anterior chest. Audible systolic click.  Pulmonary/Chest: No respiratory distress. He has no wheezes. He exhibits no tenderness.  Breathing is even and unlabored at rest.  Abdominal: He exhibits no distension and no mass. There is no tenderness.  Musculoskeletal: Normal range of motion. He exhibits no edema or tenderness.  Lymphadenopathy:    He has no cervical adenopathy.  Neurological: He is alert and oriented to person, place, and time. No cranial nerve deficit. Coordination normal.  Resting tremor bilaterally. Worse on the right side. 01/17/2009 MMSE 25/30. Passed clock drawing. 10/26/14 MMSE 23/30. Passed clock drawing.  Skin: No rash noted. No erythema. No pallor.  Multiple seborrheic keratoses. Common Nevus left neck posterior to the ear. Skin tear  left forearm.  Psychiatric: He has a normal mood and affect. His behavior is normal. Judgment and thought content normal.     Labs reviewed: Lab Summary Latest Ref Rng 06/14/2015 03/16/2015 02/08/2015 10/19/2014  Hemoglobin 13.5 - 17.5 g/dL 13.2(A) 14.5 (None) (None)  Hematocrit 41 - 53 % 40(A) 43.9 (None) (None)  White count - 9.0 6.4 (None) (None)  Platelet count 150 - 399 K/L 131(A) 155 (None) (None)  Sodium 137 - 147 mmol/L 133(A) 140 135(A) 139  Potassium 3.4 - 5.3 mmol/L 4.8 4.8 4.6 4.4  Calcium 8.6 - 10.2 mg/dL (None) 9.5 (  None) (None)  Phosphorus - (None) (None) (None) (None)  Creatinine 0.6 - 1.3 mg/dL 1.6(A) 2.13(H) 1.9(A) 2.0(A)  AST 14 - 40 U/L 53(A) 28 (None) 24  Alk Phos 25 - 125 U/L 43 47 (None) 40  Bilirubin 0.0 - 1.2 mg/dL (None) 0.7 (None) (None)  Glucose - 103 106(H) 92 97  Cholesterol 0 - 200 mg/dL (None) (None) (None) 127  HDL cholesterol 35 - 70 mg/dL (None) (None) (None) 32(A)  Triglycerides 40 - 160 mg/dL (None) (None) (None) 138  LDL Direct - (None) (None) (None) (None)  LDL Calc - (None) (None) (None) 67  Total protein - (None) (None) (None) (None)  Albumin 3.5 - 4.7 g/dL (None) 4.6 (None) (None)   Lab Results  Component Value Date   TSH 3.77 06/14/2015   Lab Results  Component Value Date   BUN 23* 06/14/2015   Lab Results  Component Value Date   HGBA1C 6.0 06/14/2015       Assessment/Plan  1. Wound, open, forearm, left, initial encounter Wound Steri-Stripped closed. Although there is a small amount of hemorrhage under the skin, overall it looks clean and is not infected at this time. We arere coming up on Christmas holidays. Because of this, I wrote him a prescription for cephalexin 500 mg, #21 tablets, take one 3 times daily if needed for infection. He can feel this if he feels that the wound is taking a turn for the worse.

## 2015-07-09 ENCOUNTER — Other Ambulatory Visit: Payer: Self-pay | Admitting: Internal Medicine

## 2015-07-10 ENCOUNTER — Encounter: Payer: Self-pay | Admitting: *Deleted

## 2015-07-10 ENCOUNTER — Other Ambulatory Visit: Payer: Self-pay

## 2015-07-10 ENCOUNTER — Other Ambulatory Visit: Payer: Self-pay | Admitting: Internal Medicine

## 2015-07-10 MED ORDER — DONEPEZIL HCL 10 MG PO TABS: ORAL_TABLET | ORAL | Status: DC

## 2015-07-11 ENCOUNTER — Other Ambulatory Visit: Payer: Self-pay

## 2015-07-11 ENCOUNTER — Other Ambulatory Visit: Payer: Self-pay | Admitting: Internal Medicine

## 2015-07-11 MED ORDER — ZOLPIDEM TARTRATE 10 MG PO TABS: ORAL_TABLET | ORAL | Status: DC

## 2015-07-11 MED ORDER — ALPRAZOLAM 0.25 MG PO TABS: ORAL_TABLET | ORAL | Status: DC

## 2015-07-24 ENCOUNTER — Other Ambulatory Visit: Payer: Self-pay | Admitting: Internal Medicine

## 2015-08-02 ENCOUNTER — Other Ambulatory Visit: Payer: Self-pay | Admitting: Internal Medicine

## 2015-08-06 ENCOUNTER — Telehealth: Payer: Self-pay | Admitting: *Deleted

## 2015-08-06 NOTE — Telephone Encounter (Signed)
Kristen notified and Left message to let us know where to send this letter to.

## 2015-08-06 NOTE — Telephone Encounter (Signed)
I will complete a letter on this patient. i will print it out tomorrow

## 2015-08-06 NOTE — Telephone Encounter (Signed)
Erasmo Downer from [employer] called and stated that Mrs. Zandi called her and told her that you would write a letter due to patient spending a lot of money. Ordered a $1300 TV that they don't need and told her that he was going to go buy a new car. Patient is getting lost driving, example,  when he went to get tires changed and it took him 7 hours to get back home. He is getting more confused with taking his medications and needs a lot of queing when it comes to personal hygiene. Wife has also noticed Personality changes. Wife stated that she has spoken with you regarding this. Please Advise.

## 2015-08-07 ENCOUNTER — Other Ambulatory Visit: Payer: Self-pay | Admitting: Internal Medicine

## 2015-08-08 ENCOUNTER — Other Ambulatory Visit: Payer: Self-pay

## 2015-08-08 MED ORDER — ZOLPIDEM TARTRATE 10 MG PO TABS: ORAL_TABLET | ORAL | Status: DC

## 2015-08-09 ENCOUNTER — Other Ambulatory Visit: Payer: Self-pay | Admitting: *Deleted

## 2015-08-09 ENCOUNTER — Other Ambulatory Visit: Payer: Self-pay | Admitting: Internal Medicine

## 2015-08-09 MED ORDER — ZOLPIDEM TARTRATE 10 MG PO TABS: ORAL_TABLET | ORAL | Status: DC

## 2015-08-09 NOTE — Telephone Encounter (Signed)
[facility]  

## 2015-08-15 ENCOUNTER — Encounter: Payer: Self-pay | Admitting: *Deleted

## 2015-08-15 NOTE — Telephone Encounter (Signed)
Kristen with [facility] would like the status on letter. Please Advise.

## 2015-08-15 NOTE — Addendum Note (Signed)
Addended by: Estill Dooms on: 08/15/2015 11:47 AM   Modules accepted: Level of Service

## 2015-08-16 LAB — HEPATIC FUNCTION PANEL
ALT: 59 U/L — AB (ref 10–40)
AST: 43 U/L — AB (ref 14–40)
Alkaline Phosphatase: 48 U/L (ref 25–125)
Bilirubin, Total: 0.6 mg/dL

## 2015-08-16 LAB — CBC AND DIFFERENTIAL
HCT: 39 % — AB (ref 41–53)
HEMOGLOBIN: 12.7 g/dL — AB (ref 13.5–17.5)
PLATELETS: 139 10*3/uL — AB (ref 150–399)
WBC: 5.7 10*3/mL

## 2015-08-16 LAB — BASIC METABOLIC PANEL
BUN: 27 mg/dL — AB (ref 4–21)
Creatinine: 1.8 mg/dL — AB (ref 0.6–1.3)
GLUCOSE: 103 mg/dL
Potassium: 5.1 mmol/L (ref 3.4–5.3)
Sodium: 131 mmol/L — AB (ref 137–147)

## 2015-08-16 LAB — TSH: TSH: 5.31 u[IU]/mL (ref 0.41–5.90)

## 2015-08-18 ENCOUNTER — Encounter (HOSPITAL_COMMUNITY): Payer: Self-pay

## 2015-08-18 ENCOUNTER — Emergency Department (HOSPITAL_COMMUNITY)
Admission: EM | Admit: 2015-08-18 | Discharge: 2015-08-18 | Disposition: A | Discharge status: Home / Self Care | Payer: Medicare Other | Attending: Emergency Medicine | Admitting: Emergency Medicine

## 2015-08-18 DIAGNOSIS — Z79899 Other long term (current) drug therapy: Secondary | ICD-10-CM | POA: Diagnosis not present

## 2015-08-18 DIAGNOSIS — Y998 Other external cause status: Secondary | ICD-10-CM | POA: Diagnosis not present

## 2015-08-18 DIAGNOSIS — R799 Abnormal finding of blood chemistry, unspecified: Secondary | ICD-10-CM | POA: Diagnosis present

## 2015-08-18 DIAGNOSIS — E785 Hyperlipidemia, unspecified: Secondary | ICD-10-CM | POA: Insufficient documentation

## 2015-08-18 DIAGNOSIS — Z8719 Personal history of other diseases of the digestive system: Secondary | ICD-10-CM | POA: Insufficient documentation

## 2015-08-18 DIAGNOSIS — Y9289 Other specified places as the place of occurrence of the external cause: Secondary | ICD-10-CM | POA: Diagnosis not present

## 2015-08-18 DIAGNOSIS — H9190 Unspecified hearing loss, unspecified ear: Secondary | ICD-10-CM | POA: Insufficient documentation

## 2015-08-18 DIAGNOSIS — L309 Dermatitis, unspecified: Secondary | ICD-10-CM | POA: Diagnosis not present

## 2015-08-18 DIAGNOSIS — Y9389 Activity, other specified: Secondary | ICD-10-CM | POA: Insufficient documentation

## 2015-08-18 DIAGNOSIS — Z7902 Long term (current) use of antithrombotics/antiplatelets: Secondary | ICD-10-CM | POA: Diagnosis not present

## 2015-08-18 DIAGNOSIS — X58XXXA Exposure to other specified factors, initial encounter: Secondary | ICD-10-CM | POA: Insufficient documentation

## 2015-08-18 DIAGNOSIS — I129 Hypertensive chronic kidney disease with stage 1 through stage 4 chronic kidney disease, or unspecified chronic kidney disease: Secondary | ICD-10-CM | POA: Insufficient documentation

## 2015-08-18 DIAGNOSIS — N189 Chronic kidney disease, unspecified: Secondary | ICD-10-CM | POA: Insufficient documentation

## 2015-08-18 DIAGNOSIS — Z950 Presence of cardiac pacemaker: Secondary | ICD-10-CM | POA: Insufficient documentation

## 2015-08-18 DIAGNOSIS — S80212A Abrasion, left knee, initial encounter: Secondary | ICD-10-CM | POA: Diagnosis not present

## 2015-08-18 DIAGNOSIS — I5032 Chronic diastolic (congestive) heart failure: Secondary | ICD-10-CM | POA: Diagnosis not present

## 2015-08-18 DIAGNOSIS — F419 Anxiety disorder, unspecified: Secondary | ICD-10-CM | POA: Diagnosis not present

## 2015-08-18 DIAGNOSIS — Z8546 Personal history of malignant neoplasm of prostate: Secondary | ICD-10-CM | POA: Diagnosis not present

## 2015-08-18 DIAGNOSIS — D649 Anemia, unspecified: Secondary | ICD-10-CM | POA: Diagnosis not present

## 2015-08-18 DIAGNOSIS — G4700 Insomnia, unspecified: Secondary | ICD-10-CM | POA: Diagnosis not present

## 2015-08-18 DIAGNOSIS — B372 Candidiasis of skin and nail: Secondary | ICD-10-CM | POA: Insufficient documentation

## 2015-08-18 DIAGNOSIS — I25119 Atherosclerotic heart disease of native coronary artery with unspecified angina pectoris: Secondary | ICD-10-CM | POA: Insufficient documentation

## 2015-08-18 DIAGNOSIS — I4891 Unspecified atrial fibrillation: Secondary | ICD-10-CM | POA: Insufficient documentation

## 2015-08-18 DIAGNOSIS — E039 Hypothyroidism, unspecified: Secondary | ICD-10-CM | POA: Insufficient documentation

## 2015-08-18 LAB — COMPREHENSIVE METABOLIC PANEL
ALT: 61 U/L (ref 17–63)
AST: 77 U/L — AB (ref 15–41)
Albumin: 4.2 g/dL (ref 3.5–5.0)
Alkaline Phosphatase: 52 U/L (ref 38–126)
Anion gap: 13 (ref 5–15)
BILIRUBIN TOTAL: 1.1 mg/dL (ref 0.3–1.2)
BUN: 31 mg/dL — AB (ref 6–20)
CHLORIDE: 96 mmol/L — AB (ref 101–111)
CO2: 21 mmol/L — ABNORMAL LOW (ref 22–32)
CREATININE: 1.87 mg/dL — AB (ref 0.61–1.24)
Calcium: 9 mg/dL (ref 8.9–10.3)
GFR, EST AFRICAN AMERICAN: 35 mL/min — AB (ref >60)
GFR, EST NON AFRICAN AMERICAN: 30 mL/min — AB (ref >60)
Glucose, Bld: 97 mg/dL (ref 65–99)
Potassium: 5.2 mmol/L — ABNORMAL HIGH (ref 3.5–5.1)
Sodium: 130 mmol/L — ABNORMAL LOW (ref 135–145)
TOTAL PROTEIN: 6.9 g/dL (ref 6.5–8.1)

## 2015-08-18 LAB — CBC
HEMATOCRIT: 43.4 % (ref 39.0–52.0)
Hemoglobin: 14 g/dL (ref 13.0–17.0)
MCH: 28.8 pg (ref 26.0–34.0)
MCHC: 32.3 g/dL (ref 30.0–36.0)
MCV: 89.3 fL (ref 78.0–100.0)
PLATELETS: 149 10*3/uL — AB (ref 150–400)
RBC: 4.86 MIL/uL (ref 4.22–5.81)
RDW: 13.6 % (ref 11.5–15.5)
WBC: 11.3 10*3/uL — AB (ref 4.0–10.5)

## 2015-08-18 LAB — URINALYSIS, ROUTINE W REFLEX MICROSCOPIC
BILIRUBIN URINE: NEGATIVE
Glucose, UA: NEGATIVE mg/dL
Hgb urine dipstick: NEGATIVE
KETONES UR: NEGATIVE mg/dL
Leukocytes, UA: NEGATIVE
NITRITE: NEGATIVE
Protein, ur: NEGATIVE mg/dL
SPECIFIC GRAVITY, URINE: 1.01 (ref 1.005–1.030)
pH: 6 (ref 5.0–8.0)

## 2015-08-18 LAB — PROTIME-INR
INR: 1.13 (ref 0.00–1.49)
PROTHROMBIN TIME: 14.7 s (ref 11.6–15.2)

## 2015-08-18 MED ORDER — SODIUM CHLORIDE 0.9 % IV BOLUS (SEPSIS)
1000.0000 mL | Freq: Once | INTRAVENOUS | Status: AC
  Administered 2015-08-18: 1000 mL via INTRAVENOUS

## 2015-08-18 MED ORDER — SODIUM CHLORIDE 0.9 % IV SOLN: INTRAVENOUS | Status: DC

## 2015-08-18 NOTE — ED Notes (Signed)
Remove PT went items off of him.Pt is aware of urine sample

## 2015-08-18 NOTE — ED Notes (Signed)
Danielle, gave patient food tray.

## 2015-08-18 NOTE — ED Notes (Signed)
Personal belongings left upon pt's discharge, PTAR notified. Belongings left at nurse's station

## 2015-08-18 NOTE — ED Notes (Signed)
Attempted to call report to [facility] however there was no answer. Voicemail left for facility rep to return call.

## 2015-08-18 NOTE — ED Provider Notes (Signed)
I went in to discuss urinalysis results with patient. Informed of normal urinalysis. Patient advised of return precautions and need for follow-up and voices understanding.  Pattricia Boss, MD 08/18/15 [phone]

## 2015-08-18 NOTE — ED Notes (Signed)
Bed: [id number] Expected date: 08/18/15 Expected time: 9:43 AM Means of arrival: Ambulance Comments: Fall

## 2015-08-18 NOTE — ED Notes (Signed)
Pt from [place] at [facility].  Apparently pt's wife fell last night so pt got down to help her up but he himself couldn't get back up.  He denies falling or any injury.  Staff sent him d/t some abnormal lab levels which aren't specifically reported.  Pt is AxOx3, "smells sort of  like a UTI" per EMS, and has some increased weakness

## 2015-08-18 NOTE — ED Notes (Signed)
During In & Out Cath patient instructed me to stop, [place] NT assisted.  Nurse RN Obie Dredge aware that patient asked NT to stop during In & Out Cath. Obtained small amount of urine from urinal.

## 2015-08-18 NOTE — Discharge Instructions (Signed)
It was our pleasure to provide your ER care today - we hope that you feel better.  From today's labs, your potassium level is slightly high (5.2), and your kidney function (creatinine 1.8) slightly higher than previous.  Drink plenty of fluids.   Hold/stop taking your potassium replacement medication.  Follow up with primary care doctor for recheck, and recheck of labs/chemistries this week - call office Monday to arrange follow up.   Return to ER if worse, new symptoms, fevers, trouble breathing, vomiting, weak/fainting, other concern.

## 2015-08-18 NOTE — ED Provider Notes (Addendum)
CSN: [id number]     Arrival date & time 08/18/15  0945 History   First MD Initiated Contact with Patient 08/18/15 [phone]     Chief Complaint  Patient presents with  . Abnormal Lab     (Consider location/radiation/quality/duration/timing/severity/associated sxs/prior Treatment) The history is provided by the patient and the EMS personnel. The history is limited by the condition of the patient.  Patient w hx htn, presents via ems from home.  Pt indicates his spouse had fallen from bed last night, and that he got down on floor to try to help her up.  He indicates when trying to get her up, scraped the skin on his knees.  Patient denies fall himself.  At facility, ems got report of some recent abnormal lab tests, but no specific results were reported.  They also report that patient noted to possibly smell like a urine infection.  Patient denies specific complaint other than knee abrasions.  No headache. No neck or back pain. No cough or uri c/o. No chest pain or sob. No abd pain. No nvd. Denies dysuria or gu c/o.  Tetanus up to date within past 5 yrs.       Past Medical History  Diagnosis Date  . Hypertension   . Hyperlipidemia   . Aortic stenosis     Moderately Severe by echo 07/2011  . 1St degree AV block   . Shortness of breath   . Angina   . Chronic kidney disease     renal insufficiency  . Pleural effusion     07/2011 - Left > Right - tapped (L) - Transudative  . Atrial fibrillation ([place])     07/2011 - amio and Rivaroxaban initiated - tee/dccv  . Diastolic CHF, chronic ([place])     07/2011 NL EF by echo  . Orthostatic hypotension   . Coronary artery disease 01/19/2012  . Cardiac pacemaker in situ 06/01/2012  . Unspecified hypothyroidism 09/24/2011  . Anxiety state, unspecified 09/24/2011  . Unspecified cataract 02/04/2011  . Disturbance of salivary secretion 07/25/2009  . Edema 09/05/2008  . Shortness of breath 09/05/2008  . 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 Insomnia,  unspecified 05/09/2005  . Urinary frequency 05/09/2005  . Malignant neoplasm of prostate ([place]) 03/04/2005  . Hypertrophy of prostate without urinary obstruction and other lower urinary tract symptoms (LUTS) 02/12/2005  . Other malaise and fatigue 02/12/2005  . Nocturia 02/22/2003  . Disturbances of sensation of smell and taste 03/16/2001  . Other specified cardiac dysrhythmias(427.89) 11/19/1999  . Anemia, unspecified   . Deaf   . Tremor 10/29/2013  . Memory loss 10/29/2013  . Hyperglycemia    Past Surgical History  Procedure Laterality Date  . Tee without cardioversion  07/17/2011    Procedure: TRANSESOPHAGEAL ECHOCARDIOGRAM (TEE);  Surgeon: Lelon Perla, MD;  Location: [facility] ENDOSCOPY;  Service: Cardiovascular;  Laterality: N/A;  With Cardioversion  . Hemorroidectomy  1968  . Spine surgery  1993    HNP(RUPTURED DISC)[name]  . Prostate surgery  05/26/2005    Dr. Terance Hart  . Eye surgery  2008    left eye ( Dr. Charise Killian)  . Esophagogastroduodenoscopy N/A 04/15/2013    Procedure: ESOPHAGOGASTRODUODENOSCOPY (EGD);  Surgeon: Cleotis Nipper, MD;  Location: [facility] ENDOSCOPY;  Service: Endoscopy;  Laterality: N/A;  . Esophagogastroduodenoscopy N/A 04/18/2013    Procedure: ESOPHAGOGASTRODUODENOSCOPY (EGD);  Surgeon: Wonda Horner, MD;  Location: [facility] ENDOSCOPY;  Service: Endoscopy;  Laterality: N/A;   Family History  Problem Relation Age  of Onset  . Cancer Mother   . Heart disease Father   . COPD Brother   . Heart disease Son    Social History  Substance Use Topics  . Smoking status: Never Smoker   . Smokeless tobacco: Never Used  . Alcohol Use: No    Review of Systems  Constitutional: Negative for fever and chills.  HENT: Negative for sore throat.   Eyes: Negative for redness.  Respiratory: Negative for cough and shortness of breath.   Cardiovascular: Negative for chest pain.  Gastrointestinal: Negative for vomiting, abdominal pain and diarrhea.  Genitourinary: Negative for  dysuria and flank pain.  Musculoskeletal: Negative for back pain and neck pain.  Skin: Negative for rash.  Neurological: Negative for headaches.  Hematological: Does not bruise/bleed easily.  Psychiatric/Behavioral: Negative for agitation.      Allergies  Mold extract  Home Medications   Prior to Admission medications   Medication Sig Start Date End Date Taking? Authorizing Provider  ALPRAZolam (XANAX) 0.25 MG tablet TAKE 1 TABLET UP TO 3 TIMES A DAY AS NEEDED FOR NERVES. 08/09/15   Tiffany L Reed, DO  amiodarone (PACERONE) 200 MG tablet TAKE 1 TABLET ONCE DAILY. 06/18/15   Estill Dooms, MD  amLODipine (NORVASC) 10 MG tablet TAKE (1) TABLET DAILY FOR HIGH BLOOD PRESSURE. 07/02/15   Estill Dooms, MD  atorvastatin (LIPITOR) 20 MG tablet Take one tablet by mouth once daily for cholesterol 02/07/15   Estill Dooms, MD  bisacodyl (DULCOLAX) 5 MG EC tablet Take 5 mg by mouth daily.    Historical Provider, MD  carvedilol (COREG) 25 MG tablet TAKE 2 TABLETS BY MOUTH EVERY 12 HOURS FOR BLOOD PRESSURE. 07/10/15   Estill Dooms, MD  Cyanocobalamin (VITAMIN B-12 PO) Take 1 tablet by mouth every evening.    Historical Provider, MD  donepezil (ARICEPT) 10 MG tablet TAKE 1 TABLET ONCE DAILY FOR MEMORY. 07/10/15   Estill Dooms, MD  doxazosin (CARDURA) 2 MG tablet TAKE 1 TABLET DAILY TO REDUCE URINARY FREQUENCY. 01/03/15   Estill Dooms, MD  ELIQUIS 2.5 MG TABS tablet TAKE 1 TABLET TWICE A DAY FOR ANTI COAGULATION. 08/02/15   Estill Dooms, MD  furosemide (LASIX) 40 MG tablet TAKE 1 TABLET ONCE DAILY TO CONTROL EDEMA. 04/19/15   Estill Dooms, MD  ipratropium (ATROVENT) 0.03 % nasal spray One spray each nostril at dinner 01/12/15   Man Mast X, NP  levothyroxine (SYNTHROID, LEVOTHROID) 100 MCG tablet TAKE 1 TABLET DAILY FOR THYROID. 07/10/15   Estill Dooms, MD  mupirocin ointment (BACTROBAN) 2 % Apply as directed to arm    Historical Provider, MD  Omega-3 Fatty Acids (FISH OIL PO) Take 1 tablet by  mouth every evening. Takes 200 mg tablet by mouth daily.    Historical Provider, MD  omeprazole (PRILOSEC) 20 MG capsule Take 1 capsule (20 mg total) by mouth daily. For stomach 01/12/15   Man Mast X, NP  pantoprazole (PROTONIX) 40 MG tablet Take one tablet by mouth once daily for stomach Patient taking differently: Take 40 mg by mouth daily.  04/04/14   Blanchie Serve, MD  potassium chloride SA (K-DUR,KLOR-CON) 20 MEQ tablet TAKE 1 TABLET ONCE DAILY. 07/02/15   Estill Dooms, MD  senna (SENOKOT) 8.6 MG TABS tablet Take 2 tablets by mouth at bedtime.    Historical Provider, MD  spironolactone (ALDACTONE) 25 MG tablet TAKE 1 TABLET BY MOUTH NIGHTLY 07/24/15   Estill Dooms, MD  zolpidem (AMBIEN) 10 MG tablet Take one tablet by mouth at bedtime as needed for sleep 08/09/15   Tiffany L Reed, DO   SpO2 94% Physical Exam  Constitutional: He appears well-developed and well-nourished. No distress.  HENT:  Head: Atraumatic.  Mouth/Throat: Oropharynx is clear and moist.  Eyes: Conjunctivae are normal. Pupils are equal, round, and reactive to light. No scleral icterus.  Neck: Normal range of motion. Neck supple. No tracheal deviation present. No thyromegaly present.  No stiffness or rigidity  Cardiovascular: Normal rate, regular rhythm, normal heart sounds and intact distal pulses.   Pulmonary/Chest: Effort normal and breath sounds normal. No accessory muscle usage. No respiratory distress.  Abdominal: Soft. Bowel sounds are normal. He exhibits no distension and no mass. There is no tenderness. There is no rebound and no guarding.  Genitourinary:  No cva tenderness. Normal ext genitalia.   Musculoskeletal: Normal range of motion.  Mild symmetric ankle edema.   Neurological: He is alert.  HOH. Alert. Speech clear/fluent. Oriented. Motor intact bil, stre 5/5. sens grossly intact.   Skin: Skin is warm and dry. He is not diaphoretic.  Mild fungal dermatitis bilateral groin creases. No cellulitis.    Psychiatric: He has a normal mood and affect.  Nursing note and vitals reviewed.   ED Course  Procedures (including critical care time) Labs Review  Results for orders placed or performed during the hospital encounter of 08/18/15  CBC  Result Value Ref Range   WBC 11.3 (H) 4.0 - 10.5 K/uL   RBC 4.86 4.22 - 5.81 MIL/uL   Hemoglobin 14.0 13.0 - 17.0 g/dL   HCT 43.4 39.0 - 52.0 %   MCV 89.3 78.0 - 100.0 fL   MCH 28.8 26.0 - 34.0 pg   MCHC 32.3 30.0 - 36.0 g/dL   RDW 13.6 11.5 - 15.5 %   Platelets 149 (L) 150 - 400 K/uL  Comprehensive metabolic panel  Result Value Ref Range   Sodium 130 (L) 135 - 145 mmol/L   Potassium 5.2 (H) 3.5 - 5.1 mmol/L   Chloride 96 (L) 101 - 111 mmol/L   CO2 21 (L) 22 - 32 mmol/L   Glucose, Bld 97 65 - 99 mg/dL   BUN 31 (H) 6 - 20 mg/dL   Creatinine, Ser 1.87 (H) 0.61 - 1.24 mg/dL   Calcium 9.0 8.9 - 10.3 mg/dL   Total Protein 6.9 6.5 - 8.1 g/dL   Albumin 4.2 3.5 - 5.0 g/dL   AST 77 (H) 15 - 41 U/L   ALT 61 17 - 63 U/L   Alkaline Phosphatase 52 38 - 126 U/L   Total Bilirubin 1.1 0.3 - 1.2 mg/dL   GFR calc non Af Amer 30 (L) >60 mL/min   GFR calc Af Amer 35 (L) >60 mL/min   Anion gap 13 5 - 15  Protime-INR  Result Value Ref Range   Prothrombin Time 14.7 11.6 - 15.2 seconds   INR 1.13 0.00 - 1.49      I have personally reviewed and evaluated these images and lab results as part of my medical decision-making.   EKG Interpretation   Date/Time:  Saturday August 18 2015 10:05:05 EST Ventricular Rate:  58 PR Interval:  194 QRS Duration: 223 QT Interval:  578 QTC Calculation: 568 R Axis:   122 Text Interpretation:  A-V dual-paced rhythm No further analysis attempted  due to paced rhythm Confirmed by Ashok Cordia  MD, Lennette Bihari ([id number]) on 08/18/2015  10:13:31 AM  MDM   Iv ns. Labs. Ua.   Reviewed nursing notes and prior charts for additional history.   Hx cri, cr sl higher than last today. Iv ns bolus.  1530 awaiting ua.    Recheck  pt no change, updated on results, and need for urine.   Po fluids.  K+ slightly high - will need to hold/stop K replacement as outpatient and follow up closely w pcp for recheck chemistries.    1530 signed out to oncoming doc, Dr Jeanell Sparrow, to follow up on UA results and dispo appropriately.         Lajean Saver, MD 08/18/15 1539

## 2015-08-20 ENCOUNTER — Encounter: Payer: Self-pay | Admitting: *Deleted

## 2015-08-24 ENCOUNTER — Encounter: Payer: Self-pay | Admitting: Internal Medicine

## 2015-08-29 ENCOUNTER — Other Ambulatory Visit: Payer: Self-pay | Admitting: Internal Medicine

## 2015-08-30 ENCOUNTER — Encounter: Payer: Self-pay | Admitting: Nurse Practitioner

## 2015-08-30 ENCOUNTER — Non-Acute Institutional Stay: Payer: Medicare Other | Admitting: Nurse Practitioner

## 2015-08-30 DIAGNOSIS — K219 Gastro-esophageal reflux disease without esophagitis: Secondary | ICD-10-CM | POA: Diagnosis not present

## 2015-08-30 DIAGNOSIS — I10 Essential (primary) hypertension: Secondary | ICD-10-CM

## 2015-08-30 DIAGNOSIS — I4891 Unspecified atrial fibrillation: Secondary | ICD-10-CM

## 2015-08-30 DIAGNOSIS — E039 Hypothyroidism, unspecified: Secondary | ICD-10-CM | POA: Diagnosis not present

## 2015-08-30 DIAGNOSIS — N184 Chronic kidney disease, stage 4 (severe): Secondary | ICD-10-CM | POA: Diagnosis not present

## 2015-08-30 DIAGNOSIS — R351 Nocturia: Secondary | ICD-10-CM | POA: Diagnosis not present

## 2015-08-30 DIAGNOSIS — R413 Other amnesia: Secondary | ICD-10-CM

## 2015-08-30 DIAGNOSIS — S51801S Unspecified open wound of right forearm, sequela: Secondary | ICD-10-CM | POA: Diagnosis not present

## 2015-08-30 DIAGNOSIS — D649 Anemia, unspecified: Secondary | ICD-10-CM

## 2015-08-30 DIAGNOSIS — I5032 Chronic diastolic (congestive) heart failure: Secondary | ICD-10-CM

## 2015-08-30 NOTE — Assessment & Plan Note (Signed)
Repeat CBC

## 2015-08-30 NOTE — Assessment & Plan Note (Signed)
Should stay in [place] for care needs, continue Aricept, last MMSE 20/30 a few weeks ago, repeat MMSE

## 2015-08-30 NOTE — Assessment & Plan Note (Signed)
Takes Levothyroxine 172mcg daily. 08/16/15 TSH 5.31. Monitor.

## 2015-08-30 NOTE — Assessment & Plan Note (Signed)
Compensated, clear lungs, and no apparent peripheral edema, takes Furosemide and Spironolactone.  

## 2015-08-30 NOTE — Assessment & Plan Note (Signed)
Stable, continue PPI

## 2015-08-30 NOTE — Assessment & Plan Note (Signed)
Urine frequency, continue Doxazosin

## 2015-08-30 NOTE — Progress Notes (Signed)
Patient ID: Barry Thompson, male   DOB: 10/16/1923, 82 y.o.   MRN: [id number]  Location:  AL [facility] Provider:  Marlana Latus NP  Code Status:  DNR Goals of care: Advanced Directive information Does patient have an advance directive?: Yes, Type of Advance Directive: [place]  No chief complaint on file.    HPI: Patient is a 82 y.o. male seen in the AL at [facility] today for evaluation of  F/u ED eval for AKI, treated with bolus IVF, Na 130, K 5s, creat near 2. Hx of memory deficit, Education officer, museum at [facility] reported his recent MMSE was 20 and he cannot manage with medications at home independently.   Review of Systems:  Review of Systems  HENT: Positive for hearing loss and tinnitus. Negative for congestion and ear pain.        Clear nasal drainage associated with eating mainly at dinner time.   Eyes: Negative.   Respiratory: Negative for cough and shortness of breath.        History of dyspnea in the past typically with exertion. He feels he is doing better with his.  Cardiovascular: Positive for leg swelling. Negative for chest pain and palpitations.       History of aortic valve replacement. History of pacemaker implantation. 1+ edema BLE  Gastrointestinal: Positive for constipation. Negative for abdominal pain.       Poor appetite.  Genitourinary:       History prostate cancer, urinary frequency, and urgency. He is impotent. He has nocturia x2-3. In the daytime he voids every 90 minutes.  Musculoskeletal: Negative.  Negative for neck pain.  Skin: Negative for rash.       New skin tear left forearm appears more irritated according to wife and patient.  Neurological: Positive for tremors. Negative for weakness.       Memory loss  Psychiatric/Behavioral: The patient is nervous/anxious.   Kristen with [facility] would like the status on letter. Please Advise.   Past Medical History  Diagnosis Date  . Hypertension   . Hyperlipidemia   . Aortic stenosis    Moderately Severe by echo 07/2011  . 1St degree AV block   . Shortness of breath   . Angina   . Chronic kidney disease     renal insufficiency  . Pleural effusion     07/2011 - Left > Right - tapped (L) - Transudative  . Atrial fibrillation ([place])     07/2011 - amio and Rivaroxaban initiated - tee/dccv  . Diastolic CHF, chronic ([place])     07/2011 NL EF by echo  . Orthostatic hypotension   . Coronary artery disease 01/19/2012  . Cardiac pacemaker in situ 06/01/2012  . Unspecified hypothyroidism 09/24/2011  . Anxiety state, unspecified 09/24/2011  . Unspecified cataract 02/04/2011  . Disturbance of salivary secretion 07/25/2009  . Edema 09/05/2008  . Shortness of breath 09/05/2008  . 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 Insomnia, unspecified 05/09/2005  . Urinary frequency 05/09/2005  . Malignant neoplasm of prostate ([place]) 03/04/2005  . Hypertrophy of prostate without urinary obstruction and other lower urinary tract symptoms (LUTS) 02/12/2005  . Other malaise and fatigue 02/12/2005  . Nocturia 02/22/2003  . Disturbances of sensation of smell and taste 03/16/2001  . Other specified cardiac dysrhythmias(427.89) 11/19/1999  . Anemia, unspecified   . Deaf   . Tremor 10/29/2013  . Memory loss 10/29/2013  . Hyperglycemia     Patient Active Problem List   Diagnosis  Date Noted  . Wound, open, forearm 07/05/2015  . Anemia 06/21/2015  . Vasomotor rhinitis 01/11/2015  . GERD (gastroesophageal reflux disease) 01/11/2015  . Hypothyroidism 04/13/2014  . Aortic valve disorder 02/02/2014  . Tremor 10/29/2013  . Memory loss 10/29/2013  . Deaf   . Encounter for therapeutic drug monitoring 08/08/2013  . Chronic kidney disease, stage IV (severe) ([place]) 04/13/2013  . Chronic diastolic congestive heart failure ([place]) 04/13/2013  . Nocturia 03/10/2013  . Urine frequency 03/10/2013  . S/P aortic valve replacement 03/10/2013  . Pacemaker-Medtronic 08/16/2012  . Complete atrioventricular block  ([place]) 07/21/2012  . Artificial cardiac pacemaker 07/13/2012  . CAD (coronary artery disease) 03/30/2012  . Chronic obstructive pulmonary disease ([place]) 02/19/2012  . Pericardial effusion 07/19/2011  . Atrial fibrillation   07/14/2011  . HTN (hypertension) 05/22/2011  . Mitral valve disease 05/22/2011  . Mixed hyperlipidemia 05/22/2011  . Atrioventricular block, complete  assoc w TAVR 05/22/2011  . Malignant neoplasm of prostate ([place]) 05/26/2005    Allergies  Allergen Reactions  . Mold Extract [Trichophyton]     Medications: Patient's Medications  New Prescriptions   No medications on file  Previous Medications   ALPRAZOLAM (XANAX) 0.25 MG TABLET    TAKE 1 TABLET UP TO 3 TIMES A DAY AS NEEDED FOR NERVES.   AMIODARONE (PACERONE) 200 MG TABLET    TAKE 1 TABLET ONCE DAILY.   AMLODIPINE (NORVASC) 10 MG TABLET    TAKE (1) TABLET DAILY FOR HIGH BLOOD PRESSURE.   ATORVASTATIN (LIPITOR) 20 MG TABLET    Take one tablet by mouth once daily for cholesterol   BISACODYL (DULCOLAX) 5 MG EC TABLET    Take 5 mg by mouth daily.   CARVEDILOL (COREG) 25 MG TABLET    TAKE 2 TABLETS BY MOUTH EVERY 12 HOURS FOR BLOOD PRESSURE.   CYANOCOBALAMIN (VITAMIN B-12 PO)    Take 1 tablet by mouth every evening.   DONEPEZIL (ARICEPT) 10 MG TABLET    TAKE 1 TABLET ONCE DAILY FOR MEMORY.   ELIQUIS 2.5 MG TABS TABLET    TAKE 1 TABLET TWICE A DAY FOR ANTI COAGULATION.   FUROSEMIDE (LASIX) 40 MG TABLET    TAKE 1 TABLET ONCE DAILY TO CONTROL EDEMA.   IPRATROPIUM (ATROVENT) 0.03 % NASAL SPRAY    One spray each nostril at dinner   LEVOTHYROXINE (SYNTHROID, LEVOTHROID) 100 MCG TABLET    TAKE 1 TABLET DAILY FOR THYROID.   MUPIROCIN OINTMENT (BACTROBAN) 2 %    Apply as directed to arm   OMEGA-3 FATTY ACIDS (FISH OIL PO)    Take 1 tablet by mouth every evening. Takes 200 mg tablet by mouth daily.   OMEPRAZOLE (PRILOSEC) 20 MG CAPSULE    Take 1 capsule (20 mg total) by mouth daily. For stomach   PANTOPRAZOLE (PROTONIX) 40 MG  TABLET    Take one tablet by mouth once daily for stomach   SENNA (SENOKOT) 8.6 MG TABS TABLET    Take 2 tablets by mouth at bedtime.   SPIRONOLACTONE (ALDACTONE) 25 MG TABLET    TAKE 1 TABLET BY MOUTH NIGHTLY   ZOLPIDEM (AMBIEN) 10 MG TABLET    Take one tablet by mouth at bedtime as needed for sleep  Modified Medications   Modified Medication Previous Medication   DOXAZOSIN (CARDURA) 2 MG TABLET doxazosin (CARDURA) 2 MG tablet      TAKE 1 TABLET DAILY TO REDUCE URINARY FREQUENCY.    TAKE 1 TABLET DAILY TO REDUCE URINARY FREQUENCY.  Discontinued  Medications   No medications on file    Physical Exam: Filed Vitals:   08/30/15 0914  BP: 130/70  Pulse: 60  Temp: 97.1 F (36.2 C)  TempSrc: Oral  Resp: 16   There is no weight on file to calculate BMI.  Physical Exam  Constitutional: He is oriented to person, place, and time. He appears well-developed and well-nourished. No distress.  HENT:  Head: Normocephalic and atraumatic.  Nose: Nose normal.  Mouth/Throat: Oropharynx is clear and moist.  Partial deafness bilaterally.  Eyes: Conjunctivae and EOM are normal. Pupils are equal, round, and reactive to light.  Wears corrective lenses.  Neck: No JVD present. No tracheal deviation present. No thyromegaly present.  Cardiovascular: Normal rate, regular rhythm and intact distal pulses.  Exam reveals no gallop and no friction rub.   2/6 systolic ejection murmur at the base of the heart. Pacemaker left upper anterior chest. Audible systolic click.  Pulmonary/Chest: No respiratory distress. He has no wheezes. He exhibits no tenderness.  Breathing is even and unlabored at rest.  Abdominal: He exhibits no distension and no mass. There is no tenderness.  Musculoskeletal: Normal range of motion. He exhibits edema. He exhibits no tenderness.  1+ edema BLE  Lymphadenopathy:    He has no cervical adenopathy.  Neurological: He is alert and oriented to person, place, and time. No cranial nerve  deficit. Coordination normal.  Resting tremor bilaterally. Worse on the right side. 01/17/2009 MMSE 25/30. Passed clock drawing. 10/26/14 MMSE 23/30. Passed clock drawing.  Skin: No rash noted. No erythema. No pallor.  Multiple seborrheic keratoses. Common Nevus left neck posterior to the ear.  Psychiatric: He has a normal mood and affect. His behavior is normal. Judgment and thought content normal.    Labs reviewed: Basic Metabolic Panel:  Recent Labs  03/16/15 1455 06/14/15 08/16/15 08/18/15 1000  NA 140 133* 131* 130*  K 4.8 4.8 5.1 5.2*  CL 95*  --   --  96*  CO2 26  --   --  21*  GLUCOSE 106*  --   --  97  BUN 26 23* 27* 31*  CREATININE 2.13* 1.6* 1.8* 1.87*  CALCIUM 9.5  --   --  9.0    Liver Function Tests:  Recent Labs  03/16/15 1455 06/14/15 08/16/15 08/18/15 1000  AST 28 53* 43* 77*  ALT 39 69* 59* 61  ALKPHOS 47 43 48 52  BILITOT 0.7  --   --  1.1  PROT 7.1  --   --  6.9  ALBUMIN 4.6  --   --  4.2    CBC:  Recent Labs  03/16/15 1455 06/14/15 08/16/15 08/18/15 1000  WBC 6.4 9.0 5.7 11.3*  NEUTROABS 4.5  --   --   --   HGB  --  13.2* 12.7* 14.0  HCT 43.9 40* 39* 43.4  MCV 89  --   --  89.3  PLT 155 131* 139* 149*    Lab Results  Component Value Date   TSH 5.31 08/16/2015   Lab Results  Component Value Date   HGBA1C 6.0 06/14/2015   Lab Results  Component Value Date   CHOL 127 10/19/2014   HDL 32* 10/19/2014   LDLCALC 67 10/19/2014   TRIG 138 10/19/2014   CHOLHDL 3.2 01/03/2013    Significant Diagnostic Results since last visit: none  Patient Care Team: Estill Dooms, MD as PCP - General (Internal Medicine) Anson Crofts, MD as Attending Physician (Cardiothoracic  Surgery)  Assessment/Plan Problem List Items Addressed This Visit    HTN (hypertension) (Chronic)    Blood pressure is controlled, takes Norvasc and Carvedilol      Atrial fibrillation   (Chronic)    Heart rate is in control, takes Amiodarone, Norvasc, and  Carvedilol. Takes Eliquis for thromboembolic risk reduction.       Chronic kidney disease, stage IV (severe) (HCC) (Chronic)    08/18/15 creat near 2 ED, s/p IVF. Repeat CMP      Chronic diastolic congestive heart failure (HCC) (Chronic)    Compensated, clear lungs, and no apparent peripheral edema, takes Furosemide and Spironolactone.      Memory loss (Chronic)    Should stay in AL for care needs, continue Aricept, last MMSE 20/30 a few weeks ago, repeat MMSE      Hypothyroidism - Primary (Chronic)    Takes Levothyroxine 194mcg daily. 08/16/15 TSH 5.31. Monitor.       Nocturia    Urine frequency, continue Doxazosin      GERD (gastroesophageal reflux disease)    Stable, continue PPI      Anemia    Repeat CBC          Family/ staff Communication: close supervision for safety and ADLs.   Labs/tests ordered:  CBC, CMP, MMSE  ManXie Medard Decuir NP [facility] [address], [place] [address] On Call:  [phone] & follow prompts after 5pm & weekends Office Phone:  [phone] Office Fax:  [phone]

## 2015-08-30 NOTE — Assessment & Plan Note (Signed)
Right fore arm, left knee, dressing change

## 2015-08-30 NOTE — Assessment & Plan Note (Signed)
08/18/15 creat near 2 ED, s/p IVF. Repeat CMP

## 2015-08-30 NOTE — Assessment & Plan Note (Signed)
Heart rate is in control, takes Amiodarone, Norvasc, and Carvedilol. Takes Eliquis for thromboembolic risk reduction.  

## 2015-08-30 NOTE — Assessment & Plan Note (Signed)
Blood pressure is controlled, takes Norvasc and Carvedilol 

## 2015-09-05 ENCOUNTER — Other Ambulatory Visit: Payer: Self-pay | Admitting: Internal Medicine

## 2015-09-05 ENCOUNTER — Other Ambulatory Visit: Payer: Self-pay | Admitting: Nurse Practitioner

## 2015-09-06 ENCOUNTER — Non-Acute Institutional Stay (SKILLED_NURSING_FACILITY): Payer: Medicare Other | Admitting: Internal Medicine

## 2015-09-06 DIAGNOSIS — I2510 Atherosclerotic heart disease of native coronary artery without angina pectoris: Secondary | ICD-10-CM | POA: Diagnosis not present

## 2015-09-06 DIAGNOSIS — I5032 Chronic diastolic (congestive) heart failure: Secondary | ICD-10-CM

## 2015-09-06 DIAGNOSIS — F03918 Unspecified dementia, unspecified severity, with other behavioral disturbance: Secondary | ICD-10-CM

## 2015-09-06 DIAGNOSIS — I10 Essential (primary) hypertension: Secondary | ICD-10-CM

## 2015-09-06 DIAGNOSIS — I2583 Coronary atherosclerosis due to lipid rich plaque: Secondary | ICD-10-CM

## 2015-09-06 DIAGNOSIS — I4891 Unspecified atrial fibrillation: Secondary | ICD-10-CM | POA: Diagnosis not present

## 2015-09-06 DIAGNOSIS — F0391 Unspecified dementia with behavioral disturbance: Secondary | ICD-10-CM | POA: Diagnosis not present

## 2015-09-06 DIAGNOSIS — E039 Hypothyroidism, unspecified: Secondary | ICD-10-CM

## 2015-09-06 DIAGNOSIS — Z950 Presence of cardiac pacemaker: Secondary | ICD-10-CM | POA: Diagnosis not present

## 2015-09-06 LAB — BASIC METABOLIC PANEL
BUN: 29 mg/dL — AB (ref 4–21)
CREATININE: 1.8 mg/dL — AB (ref 0.6–1.3)
GLUCOSE: 92 mg/dL
POTASSIUM: 4.4 mmol/L (ref 3.4–5.3)
Sodium: 137 mmol/L (ref 137–147)

## 2015-09-06 NOTE — Progress Notes (Signed)
Patient ID: Barry Thompson, male   DOB: [birth year], 82 y.o.   MRN: [id number]  Provider:  Dr. Jeanmarie Hubert Location:  [place] Room Number: 111 Place of Service:  SNF (31)  PCP: Estill Dooms, MD Patient Care Team: Estill Dooms, MD as PCP - General (Internal Medicine) Anson Crofts, MD as Attending Physician (Cardiothoracic Surgery)  Extended Emergency Contact Information Primary Emergency Contact: Douthat,[name] Address: [address] [place]          [place], [place] [address] [place] Phone: [phone] Relation: Spouse Secondary Emergency Contact: [place],  [address] [place] Phone: [phone] Relation: Son  Goals of Care: Advanced Directive information Advanced Directives 08/30/2015  Does patient have an advance directive? Yes  Type of Advance Directive Healthcare Power of Attorney  Copy of advanced directive(s) in chart? -      Chief Complaint  Patient presents with  . New Admit To SNF    New Admit to Skilled. Deterioration in memory. MMSE 18/30 on 09/05/15    HPI: Patient is a 82 y.o. male seen today for admission to [facility] memory care unit. Patient's behavior has become more erratic with excessive spending of money, cursing, and getting lost when driving. These were all atypical behaviors for him. His wife is quite concerned. Patient was encouraged to come to the memory care unit" for a few days". Other issues include atrial fibrillation, chronic dyspnea, moderate aortic stenosis, diastolic congestive heart failure, coronary artery disease, cardiac pacemaker, hypothyroidism, chronic anemia, significant deafness that interferes with communication   Past Medical History  Diagnosis Date  . Hypertension   . Hyperlipidemia   . Aortic stenosis     Moderately Severe by echo 07/2011  . 1St degree AV block   . Shortness of breath   . Angina   . Chronic kidney  disease     renal insufficiency  . Pleural effusion     07/2011 - Left > Right - tapped (L) - Transudative  . Atrial fibrillation ([place])     07/2011 - amio and Rivaroxaban initiated - tee/dccv  . Diastolic CHF, chronic ([place])     07/2011 NL EF by echo  . Orthostatic hypotension   . Coronary artery disease 01/19/2012  . Cardiac pacemaker in situ 06/01/2012  . Unspecified hypothyroidism 09/24/2011  . Anxiety state, unspecified 09/24/2011  . Unspecified cataract 02/04/2011  . Disturbance of salivary secretion 07/25/2009  . Edema 09/05/2008  . Shortness of breath 09/05/2008  . 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 Insomnia, unspecified 05/09/2005  . Urinary frequency 05/09/2005  . Malignant neoplasm of prostate ([place]) 03/04/2005  . Hypertrophy of prostate without urinary obstruction and other lower urinary tract symptoms (LUTS) 02/12/2005  . Other malaise and fatigue 02/12/2005  . Nocturia 02/22/2003  . Disturbances of sensation of smell and taste 03/16/2001  . Other specified cardiac dysrhythmias(427.89) 11/19/1999  . Anemia, unspecified   . Deaf   . Tremor 10/29/2013  . Memory loss 10/29/2013  . Hyperglycemia    Past Surgical History  Procedure Laterality Date  . Tee without cardioversion  07/17/2011    Procedure: TRANSESOPHAGEAL ECHOCARDIOGRAM (TEE);  Surgeon: Lelon Perla, MD;  [facility] ENDOSCOPY;  Service: Cardiovascular;  Laterality: N/A;  With Cardioversion  . Hemorroidectomy  1968  . Spine surgery  1993    HNP(RUPTURED DISC)[name]  .  Prostate surgery  05/26/2005    Dr. Terance Hart  . Eye surgery  2008    left eye ( Dr. Charise Killian)  . Esophagogastroduodenoscopy N/A 04/15/2013    Procedure: ESOPHAGOGASTRODUODENOSCOPY (EGD);  Surgeon: Cleotis Nipper, MD;  Location: [facility] ENDOSCOPY;  Service: Endoscopy;  Laterality: N/A;  . Esophagogastroduodenoscopy N/A 04/18/2013    Procedure: ESOPHAGOGASTRODUODENOSCOPY (EGD);  Surgeon: Wonda Horner, MD;  Location: [facility] ENDOSCOPY;   Service: Endoscopy;  Laterality: N/A;    reports that he has never smoked. He has never used smokeless tobacco. He reports that he does not drink alcohol or use illicit drugs. Social History   Social History  . Marital Status: Married    Spouse Name: N/A  . Number of Children: N/A  . Years of Education: N/A   Occupational History  . Not on file.   Social History Main Topics  . Smoking status: Never Smoker   . Smokeless tobacco: Never Used  . Alcohol Use: No  . Drug Use: No  . Sexual Activity: No   Other Topics Concern  . Not on file   Social History Narrative   Lives at [place] [facility] since 08/26/13   Married Romie Minus   Exercise walking daily with cane   Never smoke   Hard of hearing              Family History  Problem Relation Age of Onset  . Cancer Mother   . Heart disease Father   . COPD Brother   . Heart disease Son     Health Maintenance  Topic Date Due  . ZOSTAVAX  04/16/1986  . PNA vac Low Risk Adult (2 of 2 - PPSV23) 07/14/2006  . INFLUENZA VACCINE  02/12/2016  . TETANUS/TDAP  02/28/2022    Allergies  Allergen Reactions  . Mold Extract [Trichophyton]       Medication List       This list is accurate as of: 09/06/15  2:09 PM.  Always use your most recent med list.               ALPRAZolam 0.25 MG tablet  Commonly known as:  XANAX  TAKE 1 TABLET UP TO 3 TIMES A DAY AS NEEDED FOR NERVES.     amiodarone 200 MG tablet  Commonly known as:  PACERONE  TAKE 1 TABLET ONCE DAILY.     amLODipine 10 MG tablet  Commonly known as:  NORVASC  TAKE (1) TABLET DAILY FOR HIGH BLOOD PRESSURE.     atorvastatin 20 MG tablet  Commonly known as:  LIPITOR  Take one tablet by mouth once daily for cholesterol     bisacodyl 5 MG EC tablet  Commonly known as:  DULCOLAX  Take 5 mg by mouth daily.     carvedilol 25 MG tablet  Commonly known as:  COREG  TAKE 2 TABLETS BY MOUTH EVERY 12 HOURS FOR BLOOD PRESSURE.     donepezil 10 MG tablet  Commonly  known as:  ARICEPT  TAKE 1 TABLET ONCE DAILY FOR MEMORY.     doxazosin 2 MG tablet  Commonly known as:  CARDURA  TAKE 1 TABLET DAILY TO REDUCE URINARY FREQUENCY.     ELIQUIS 2.5 MG Tabs tablet  Generic drug:  apixaban  TAKE 1 TABLET TWICE A DAY FOR ANTI COAGULATION.     FISH OIL PO  Take 1 tablet by mouth every evening. Takes 200 mg tablet by mouth daily.     furosemide 40 MG tablet  Commonly known as:  LASIX  TAKE 1 TABLET ONCE DAILY TO CONTROL EDEMA.     ipratropium 0.03 % nasal spray  Commonly known as:  ATROVENT  One spray each nostril at dinner     levothyroxine 100 MCG tablet  Commonly known as:  SYNTHROID, LEVOTHROID  TAKE 1 TABLET DAILY FOR THYROID.     mupirocin ointment 2 %  Commonly known as:  BACTROBAN  Apply as directed to arm     omeprazole 20 MG capsule  Commonly known as:  PRILOSEC  Take 1 capsule (20 mg total) by mouth daily. For stomach     pantoprazole 40 MG tablet  Commonly known as:  PROTONIX  Take one tablet by mouth once daily for stomach     senna 8.6 MG Tabs tablet  Commonly known as:  SENOKOT  Take 2 tablets by mouth at bedtime.     spironolactone 25 MG tablet  Commonly known as:  ALDACTONE  TAKE 1 TABLET BY MOUTH NIGHTLY     VITAMIN B-12 PO  Take 1 tablet by mouth every evening.     zolpidem 10 MG tablet  Commonly known as:  AMBIEN  TAKE 1 TABLET AT BEDTIME AS NEEDED FOR SLEEP.        Review of Systems  Constitutional: Positive for fatigue. Negative for activity change, appetite change and unexpected weight change.  HENT: Positive for hearing loss and tinnitus. Negative for congestion and ear pain.        Clear nasal drainage associated with eating mainly at dinner time.   Eyes: Negative.   Respiratory: Negative for cough, chest tightness and shortness of breath.        History of dyspnea in the past typically with exertion. He feels he is doing better with his.  Cardiovascular: Negative for chest pain, palpitations and leg  swelling.       History of aortic valve replacement. History of pacemaker implantation.  Gastrointestinal: Positive for constipation. Negative for abdominal pain and abdominal distention.       Poor appetite.  Endocrine: Negative.   Genitourinary:       History prostate cancer, urinary frequency, and urgency. He is impotent. He has nocturia x2-3. In the daytime he voids every 90 minutes.  Musculoskeletal: Negative.  Negative for neck pain and neck stiffness.  Skin: Positive for pallor. Negative for rash and wound.       New skin tear left forearm appears more irritated according to wife and patient.  Neurological: Positive for tremors. Negative for syncope, weakness and numbness.       Memory loss  Hematological: Negative.   Psychiatric/Behavioral: Positive for confusion. The patient is nervous/anxious.     Filed Vitals:   09/06/15 1359  BP: 148/70  Pulse: 68  Temp: 98.2 F (36.8 C)  TempSrc: Oral  Resp: 18   There is no weight on file to calculate BMI. Physical Exam  Constitutional: He is oriented to person, place, and time. He appears well-developed and well-nourished. No distress.  HENT:  Head: Normocephalic and atraumatic.  Nose: Nose normal.  Mouth/Throat: Oropharynx is clear and moist.  Partial deafness bilaterally.  Eyes: Conjunctivae and EOM are normal. Pupils are equal, round, and reactive to light.  Wears corrective lenses.  Neck: No JVD present. No tracheal deviation present. No thyromegaly present.  Cardiovascular: Normal rate, regular rhythm and intact distal pulses.  Exam reveals no gallop and no friction rub.   2/6 systolic ejection murmur at the base of the  heart. Pacemaker left upper anterior chest. Audible systolic click.  Pulmonary/Chest: No respiratory distress. He has no wheezes. He exhibits no tenderness.  Breathing is even and unlabored at rest.  Abdominal: He exhibits no distension and no mass. There is no tenderness.  Musculoskeletal: Normal range of  motion. He exhibits no edema or tenderness.  Lymphadenopathy:    He has no cervical adenopathy.  Neurological: He is alert and oriented to person, place, and time. No cranial nerve deficit. Coordination normal.  Resting tremor bilaterally. Worse on the right side. 01/17/2009 MMSE 25/30. Passed clock drawing. 10/26/14 MMSE 23/30. Passed clock drawing.  Skin: No rash noted. No erythema. No pallor.  Multiple seborrheic keratoses. Common Nevus left neck posterior to the ear. Skin tear left forearm.  Psychiatric:  Patient has impaired executive function and short-term memory is quite poor. Decision-making has undergone a dramatic change for the worse. There are personality change changes as well.    Labs reviewed: Basic Metabolic Panel:  Recent Labs  03/16/15 1455 06/14/15 08/16/15 08/18/15 1000  NA 140 133* 131* 130*  K 4.8 4.8 5.1 5.2*  CL 95*  --   --  96*  CO2 26  --   --  21*  GLUCOSE 106*  --   --  97  BUN 26 23* 27* 31*  CREATININE 2.13* 1.6* 1.8* 1.87*  CALCIUM 9.5  --   --  9.0   Liver Function Tests:  Recent Labs  03/16/15 1455 06/14/15 08/16/15 08/18/15 1000  AST 28 53* 43* 77*  ALT 39 69* 59* 61  ALKPHOS 47 43 48 52  BILITOT 0.7  --   --  1.1  PROT 7.1  --   --  6.9  ALBUMIN 4.6  --   --  4.2   No results for input(s): LIPASE, AMYLASE in the last 8760 hours. No results for input(s): AMMONIA in the last 8760 hours. CBC:  Recent Labs  03/16/15 1455 06/14/15 08/16/15 08/18/15 1000  WBC 6.4 9.0 5.7 11.3*  NEUTROABS 4.5  --   --   --   HGB  --  13.2* 12.7* 14.0  HCT 43.9 40* 39* 43.4  MCV 89  --   --  89.3  PLT 155 131* 139* 149*   Cardiac Enzymes: No results for input(s): CKTOTAL, CKMB, CKMBINDEX, TROPONINI in the last 8760 hours. BNP: Invalid input(s): POCBNP Lab Results  Component Value Date   HGBA1C 6.0 06/14/2015   Lab Results  Component Value Date   TSH 5.31 08/16/2015     Assessment/Plan  1. Dementia with behavioral disturbance At  this point, patient should remain in the memory care unit. Started on risperidone 0.5 mg twice daily.  2. Atrial fibrillation   Rate controlled and anticoagulated  3. Coronary artery disease due to lipid rich plaque No angina or palpitations  4. Chronic diastolic congestive heart failure ([place]) Compensated  5. Essential hypertension Controlled  6. Hypothyroidism, unspecified hypothyroidism type Compensated  7. Pacemaker-Medtronic Functioning appropriately

## 2015-09-10 ENCOUNTER — Non-Acute Institutional Stay (SKILLED_NURSING_FACILITY): Payer: Medicare Other | Admitting: Nurse Practitioner

## 2015-09-10 ENCOUNTER — Encounter: Payer: Self-pay | Admitting: Nurse Practitioner

## 2015-09-10 DIAGNOSIS — N189 Chronic kidney disease, unspecified: Secondary | ICD-10-CM

## 2015-09-10 DIAGNOSIS — D631 Anemia in chronic kidney disease: Secondary | ICD-10-CM

## 2015-09-10 DIAGNOSIS — I4891 Unspecified atrial fibrillation: Secondary | ICD-10-CM | POA: Diagnosis not present

## 2015-09-10 DIAGNOSIS — R413 Other amnesia: Secondary | ICD-10-CM

## 2015-09-10 DIAGNOSIS — I10 Essential (primary) hypertension: Secondary | ICD-10-CM

## 2015-09-10 DIAGNOSIS — R351 Nocturia: Secondary | ICD-10-CM

## 2015-09-10 DIAGNOSIS — E039 Hypothyroidism, unspecified: Secondary | ICD-10-CM | POA: Diagnosis not present

## 2015-09-10 DIAGNOSIS — N184 Chronic kidney disease, stage 4 (severe): Secondary | ICD-10-CM

## 2015-09-10 DIAGNOSIS — K219 Gastro-esophageal reflux disease without esophagitis: Secondary | ICD-10-CM

## 2015-09-10 DIAGNOSIS — I5032 Chronic diastolic (congestive) heart failure: Secondary | ICD-10-CM

## 2015-09-10 NOTE — Assessment & Plan Note (Signed)
Compensated, clear lungs, and no apparent peripheral edema, takes Furosemide and Spironolactone.  

## 2015-09-10 NOTE — Assessment & Plan Note (Signed)
Stable, last Hgb 14/08/18/15

## 2015-09-10 NOTE — Progress Notes (Signed)
Patient ID: Barry Thompson, male   DOB: 06-08-1924, 82 y.o.   MRN: [id number]  Provider:  Marlana Latus NP Location:  [facility] Room Number: 111 Place of Service:  SNF (31)  PCP: Estill Dooms, MD Patient Care Team: Estill Dooms, MD as PCP - General (Internal Medicine) Anson Crofts, MD as Attending Physician (Cardiothoracic Surgery)  Extended Emergency Contact Information Primary Emergency Contact: Kercher,[name] Address: [address] [place]          [place], [place] [address] [place] Phone: [phone] Relation: Spouse Secondary Emergency Contact: [place], [place] [address] [place] Phone: [phone] Relation: Son  Code Status: DNR Goals of Care: Advanced Directive information Advanced Directives 09/10/2015  Does patient have an advance directive? Yes  Type of Advance Directive [place]  Does patient want to make changes to advanced directive? No - Patient declined  Copy of advanced directive(s) in chart? Yes      Chief Complaint  Patient presents with  . Leg Problem    edema in both legs    HPI: Patient is a 82 y.o. male seen today for  HTN (hypertension) (Chronic). Blood pressure is controlled, takes Norvasc and Carvedilol. Atrial fibrillation   (Chronic). Heart rate is in control, takes Amiodarone, Norvasc, and Carvedilol. Takes Eliquis for thromboembolic risk reduction. Chronic diastolic congestive heart failure (Chronic). Compensated, clear lungs, and no apparent peripheral edema, takes Furosemide and Spironolactone. Memory loss (Chronic). Takes Aricept, admitted to [facility] related to progression of the disease and he is no longer self sufficient in AL setting. Hypothyroidism (Chronic). Takes Levothyroxine 159mcg daily. Last TSH 5.31 08/16/15. Malignant neoplasm of prostate. Sleeps 8 hours at night without urination. Also no urinary retention. Vasomotor rhinitis - Been  several month, associated with eating mainly at dinner time, clear nasal drainage and post nasal drainage, denied chest pain or cough at night or SOB. Flonase with little effect. Will try Atrovent nasal spray I each nostril ac dinner. Also may treat possible GERD with Omeprazole.   Past Medical History  Diagnosis Date  . Hypertension   . Hyperlipidemia   . Aortic stenosis     Moderately Severe by echo 07/2011  . 1St degree AV block   . Shortness of breath   . Angina   . Chronic kidney disease     renal insufficiency  . Pleural effusion     07/2011 - Left > Right - tapped (L) - Transudative  . Atrial fibrillation ([place])     07/2011 - amio and Rivaroxaban initiated - tee/dccv  . Diastolic CHF, chronic ([place])     07/2011 NL EF by echo  . Orthostatic hypotension   . Coronary artery disease 01/19/2012  . Cardiac pacemaker in situ 06/01/2012  . Unspecified hypothyroidism 09/24/2011  . Anxiety state, unspecified 09/24/2011  . Unspecified cataract 02/04/2011  . Disturbance of salivary secretion 07/25/2009  . Edema 09/05/2008  . Shortness of breath 09/05/2008  . 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 Insomnia, unspecified 05/09/2005  . Urinary frequency 05/09/2005  . Malignant neoplasm of prostate ([place]) 03/04/2005  . Hypertrophy of prostate without urinary obstruction and other lower urinary tract symptoms (LUTS) 02/12/2005  . Other malaise and fatigue 02/12/2005  . Nocturia 02/22/2003  . Disturbances of sensation of smell and taste 03/16/2001  . Other specified cardiac dysrhythmias(427.89) 11/19/1999  . Anemia, unspecified   .  Deaf   . Tremor 10/29/2013  . Memory loss 10/29/2013  . Hyperglycemia    Past Surgical History  Procedure Laterality Date  . Tee without cardioversion  07/17/2011    Procedure: TRANSESOPHAGEAL ECHOCARDIOGRAM (TEE);  Surgeon: Lelon Perla, MD;  Location: [facility] ENDOSCOPY;  Service: Cardiovascular;  Laterality: N/A;  With Cardioversion  . Hemorroidectomy  1968  .  Spine surgery  1993    HNP(RUPTURED DISC)[name]  . Prostate surgery  05/26/2005    Dr. Terance Hart  . Eye surgery  2008    left eye ( Dr. Charise Killian)  . Esophagogastroduodenoscopy N/A 04/15/2013    Procedure: ESOPHAGOGASTRODUODENOSCOPY (EGD);  Surgeon: Cleotis Nipper, MD;  Location: [facility] ENDOSCOPY;  Service: Endoscopy;  Laterality: N/A;  . Esophagogastroduodenoscopy N/A 04/18/2013    Procedure: ESOPHAGOGASTRODUODENOSCOPY (EGD);  Surgeon: Wonda Horner, MD;  Location: [facility] ENDOSCOPY;  Service: Endoscopy;  Laterality: N/A;    reports that he has never smoked. He has never used smokeless tobacco. He reports that he does not drink alcohol or use illicit drugs. Social History   Social History  . Marital Status: Married    Spouse Name: N/A  . Number of Children: N/A  . Years of Education: N/A   Occupational History  . Not on file.   Social History Main Topics  . Smoking status: Never Smoker   . Smokeless tobacco: Never Used  . Alcohol Use: No  . Drug Use: No  . Sexual Activity: No   Other Topics Concern  . Not on file   Social History Narrative   Lives at [facility] since 08/26/13   Married Romie Minus   Exercise walking daily with cane   Never smoke   Hard of hearing             Functional Status Survey:    Family History  Problem Relation Age of Onset  . Cancer Mother   . Heart disease Father   . COPD Brother   . Heart disease Son     Health Maintenance  Topic Date Due  . ZOSTAVAX  04/16/1986  . PNA vac Low Risk Adult (2 of 2 - PPSV23) 07/14/2006  . INFLUENZA VACCINE  02/12/2016  . TETANUS/TDAP  02/28/2022    Allergies  Allergen Reactions  . Mold Extract [Trichophyton]       Medication List       This list is accurate as of: 09/10/15  1:52 PM.  Always use your most recent med list.               ALPRAZolam 0.25 MG tablet  Commonly known as:  XANAX  TAKE 1 TABLET UP TO 3 TIMES A DAY AS NEEDED FOR NERVES.     amiodarone 200 MG tablet    Commonly known as:  PACERONE  TAKE 1 TABLET ONCE DAILY.     amLODipine 10 MG tablet  Commonly known as:  NORVASC  TAKE (1) TABLET DAILY FOR HIGH BLOOD PRESSURE.     atorvastatin 20 MG tablet  Commonly known as:  LIPITOR  Take one tablet by mouth once daily for cholesterol     bisacodyl 5 MG EC tablet  Commonly known as:  DULCOLAX  Take 5 mg by mouth daily.     carvedilol 25 MG tablet  Commonly known as:  COREG  TAKE 2 TABLETS BY MOUTH EVERY 12 HOURS FOR BLOOD PRESSURE.     donepezil 10 MG tablet  Commonly known as:  ARICEPT  TAKE 1  TABLET ONCE DAILY FOR MEMORY.     doxazosin 2 MG tablet  Commonly known as:  CARDURA  TAKE 1 TABLET DAILY TO REDUCE URINARY FREQUENCY.     ELIQUIS 2.5 MG Tabs tablet  Generic drug:  apixaban  TAKE 1 TABLET TWICE A DAY FOR ANTI COAGULATION.     FISH OIL PO  Take 1 tablet by mouth every evening. Takes 200 mg tablet by mouth daily.     furosemide 40 MG tablet  Commonly known as:  LASIX  TAKE 1 TABLET ONCE DAILY TO CONTROL EDEMA.     ipratropium 0.03 % nasal spray  Commonly known as:  ATROVENT  One spray each nostril at dinner     levothyroxine 100 MCG tablet  Commonly known as:  SYNTHROID, LEVOTHROID  TAKE 1 TABLET DAILY FOR THYROID.     mupirocin ointment 2 %  Commonly known as:  BACTROBAN  Apply as directed to arm     omeprazole 20 MG capsule  Commonly known as:  PRILOSEC  Take 1 capsule (20 mg total) by mouth daily. For stomach     pantoprazole 40 MG tablet  Commonly known as:  PROTONIX  Take one tablet by mouth once daily for stomach     senna 8.6 MG Tabs tablet  Commonly known as:  SENOKOT  Take 2 tablets by mouth at bedtime.     spironolactone 25 MG tablet  Commonly known as:  ALDACTONE  TAKE 1 TABLET BY MOUTH NIGHTLY     VITAMIN B-12 PO  Take 1 tablet by mouth every evening.     zolpidem 10 MG tablet  Commonly known as:  AMBIEN  TAKE 1 TABLET AT BEDTIME AS NEEDED FOR SLEEP.        Review of Systems   Constitutional: Positive for fatigue. Negative for activity change, appetite change and unexpected weight change.  HENT: Positive for hearing loss and tinnitus. Negative for congestion and ear pain.        Clear nasal drainage associated with eating mainly at dinner time.   Eyes: Negative.   Respiratory: Negative for cough, chest tightness and shortness of breath.        History of dyspnea in the past typically with exertion. He feels he is doing better with his.  Cardiovascular: Positive for leg swelling. Negative for chest pain and palpitations.       History of aortic valve replacement. History of pacemaker implantation. 1+ edema BLE  Gastrointestinal: Positive for constipation. Negative for abdominal pain and abdominal distention.       Poor appetite.  Endocrine: Negative.   Genitourinary:       History prostate cancer, urinary frequency, and urgency. He is impotent. He has nocturia x2-3. In the daytime he voids every 90 minutes.  Musculoskeletal: Negative.  Negative for neck pain and neck stiffness.  Skin: Positive for pallor. Negative for rash and wound.       New skin tear left forearm appears more irritated according to wife and patient.  Neurological: Positive for tremors. Negative for syncope, weakness and numbness.       Memory loss  Hematological: Negative.   Psychiatric/Behavioral: Positive for confusion. The patient is nervous/anxious.     Filed Vitals:   09/10/15 1122  BP: 118/80  Pulse: 88  Temp: 96.5 F (35.8 C)  TempSrc: Oral  Resp: 20   There is no weight on file to calculate BMI. Physical Exam  Constitutional: He is oriented to person, place, and time. He appears  well-developed and well-nourished. No distress.  HENT:  Head: Normocephalic and atraumatic.  Nose: Nose normal.  Mouth/Throat: Oropharynx is clear and moist.  Partial deafness bilaterally.  Eyes: Conjunctivae and EOM are normal. Pupils are equal, round, and reactive to light.  Wears corrective  lenses.  Neck: No JVD present. No tracheal deviation present. No thyromegaly present.  Cardiovascular: Normal rate, regular rhythm and intact distal pulses.  Exam reveals no gallop and no friction rub.   2/6 systolic ejection murmur at the base of the heart. Pacemaker left upper anterior chest. Audible systolic click.  Pulmonary/Chest: No respiratory distress. He has no wheezes. He exhibits no tenderness.  Breathing is even and unlabored at rest.  Abdominal: He exhibits no distension and no mass. There is no tenderness.  Musculoskeletal: Normal range of motion. He exhibits edema. He exhibits no tenderness.  1+ edema BLE  Lymphadenopathy:    He has no cervical adenopathy.  Neurological: He is alert and oriented to person, place, and time. No cranial nerve deficit. Coordination normal.  Resting tremor bilaterally. Worse on the right side. 01/17/2009 MMSE 25/30. Passed clock drawing. 10/26/14 MMSE 23/30. Passed clock drawing.  Skin: No rash noted. No erythema. No pallor.  Multiple seborrheic keratoses. Common Nevus left neck posterior to the ear.  Psychiatric: He has a normal mood and affect. His behavior is normal. Judgment and thought content normal.    Labs reviewed: Basic Metabolic Panel:  Recent Labs  03/16/15 1455  08/16/15 08/18/15 1000 09/06/15  NA 140  < > 131* 130* 137  K 4.8  < > 5.1 5.2* 4.4  CL 95*  --   --  96*  --   CO2 26  --   --  21*  --   GLUCOSE 106*  --   --  97  --   BUN 26  < > 27* 31* 29*  CREATININE 2.13*  < > 1.8* 1.87* 1.8*  CALCIUM 9.5  --   --  9.0  --   < > = values in this interval not displayed. Liver Function Tests:  Recent Labs  03/16/15 1455 06/14/15 08/16/15 08/18/15 1000  AST 28 53* 43* 77*  ALT 39 69* 59* 61  ALKPHOS 47 43 48 52  BILITOT 0.7  --   --  1.1  PROT 7.1  --   --  6.9  ALBUMIN 4.6  --   --  4.2   No results for input(s): LIPASE, AMYLASE in the last 8760 hours. No results for input(s): AMMONIA in the last 8760  hours. CBC:  Recent Labs  03/16/15 1455 06/14/15 08/16/15 08/18/15 1000  WBC 6.4 9.0 5.7 11.3*  NEUTROABS 4.5  --   --   --   HGB  --  13.2* 12.7* 14.0  HCT 43.9 40* 39* 43.4  MCV 89  --   --  89.3  PLT 155 131* 139* 149*   Cardiac Enzymes: No results for input(s): CKTOTAL, CKMB, CKMBINDEX, TROPONINI in the last 8760 hours. BNP: Invalid input(s): POCBNP Lab Results  Component Value Date   HGBA1C 6.0 06/14/2015   Lab Results  Component Value Date   TSH 5.31 08/16/2015   No results found for: VITAMINB12 No results found for: FOLATE No results found for: IRON, TIBC, FERRITIN  Imaging and Procedures obtained prior to SNF admission: No results found.  Assessment/Plan  HTN (hypertension) Blood pressure is controlled, takes Norvasc and Carvedilol  Atrial fibrillation   Heart rate is in control, takes Amiodarone, Norvasc,  and Carvedilol. Takes Eliquis for thromboembolic risk reduction.   Chronic kidney disease, stage IV (severe) ([place]) 08/18/15 creat near 2 ED, s/p IVF. 09/06/15 creat 123XX123  Chronic diastolic congestive heart failure Compensated, clear lungs, and no apparent peripheral edema, takes Furosemide and Spironolactone.  Memory loss Admitted to SNF, [facility] for care needs, continue Aricept, last MMSE 20/30 a few weeks ago, 18/30 09/05/15  Hypothyroidism Continue Levothyroxine 13mcg daily. 08/16/15 TSH 5.31. Monitor. TSH in 8 weeks.   Nocturia Urine frequency, continue Doxazosin  GERD (gastroesophageal reflux disease) Stable, continue Omeprazole 20mg  daily.   Anemia Stable, last Hgb 14/08/18/15    Family/ staff Communication: continue SNF [facility] for care needs.   Labs/tests ordered: BMP done 09/06/15, TSH in 8 weeks.

## 2015-09-10 NOTE — Assessment & Plan Note (Signed)
Heart rate is in control, takes Amiodarone, Norvasc, and Carvedilol. Takes Eliquis for thromboembolic risk reduction.  

## 2015-09-10 NOTE — Assessment & Plan Note (Signed)
Admitted to SNF, [facility] for care needs, continue Aricept, last MMSE 20/30 a few weeks ago, 18/30 09/05/15

## 2015-09-10 NOTE — Assessment & Plan Note (Signed)
Stable, continue Omeprazole 20mg daily.  

## 2015-09-10 NOTE — Assessment & Plan Note (Signed)
08/18/15 creat near 2 ED, s/p IVF. 09/06/15 creat 1.76

## 2015-09-10 NOTE — Assessment & Plan Note (Signed)
Blood pressure is controlled, takes Norvasc and Carvedilol 

## 2015-09-10 NOTE — Assessment & Plan Note (Signed)
Urine frequency, continue Doxazosin

## 2015-09-10 NOTE — Assessment & Plan Note (Signed)
Continue Levothyroxine 155mcg daily. 08/16/15 TSH 5.31. Monitor. TSH in 8 weeks.

## 2015-09-21 DIAGNOSIS — F03918 Unspecified dementia, unspecified severity, with other behavioral disturbance: Secondary | ICD-10-CM | POA: Insufficient documentation

## 2015-09-21 DIAGNOSIS — F0391 Unspecified dementia with behavioral disturbance: Secondary | ICD-10-CM | POA: Insufficient documentation

## 2015-09-27 NOTE — Assessment & Plan Note (Signed)
Continue Levothyroxine 17mcg daily. 08/16/15 TSH 5.31. Monitor. TSH in 8 weeks.

## 2015-09-27 NOTE — Assessment & Plan Note (Signed)
Stable, continue Omeprazole 20mg daily.  

## 2015-09-27 NOTE — Assessment & Plan Note (Addendum)
Compensated, clear lungs, and no apparent peripheral edema, continue Furosemide and Spironolactone.

## 2015-09-27 NOTE — Assessment & Plan Note (Signed)
08/18/15 creat near 2 ED, s/p IVF. 09/06/15 creat 1.76

## 2015-09-27 NOTE — Assessment & Plan Note (Signed)
Continue SNF, [facility] for care needs, continue Aricept, last MMSE 20/30 a few weeks ago, 18/30 09/05/15

## 2015-09-27 NOTE — Assessment & Plan Note (Signed)
Blood pressure is controlled, continue Norvasc and Carvedilol

## 2015-09-27 NOTE — Progress Notes (Signed)
Patient ID: DETROIT FADELEY, male   DOB: [birth year], 82 y.o.   MRN: [id number]  Location:  [facility] Room Number: 111 Place of Service:  SNF (31) Provider: Lennie Odor Devorah Givhan NP  GREEN, Viviann Spare, MD  Patient Care Team: Estill Dooms, MD as PCP - General (Internal Medicine) Anson Crofts, MD as Attending Physician (Cardiothoracic Surgery)  Extended Emergency Contact Information Primary Emergency Contact: Courter,[name] Address: [address] [place]          [place], [place] [address] [place] Phone: [phone] Relation: Spouse Secondary Emergency Contact: [place], [place] [address] [place] Phone: [phone] Relation: Son  Code Status:  DNR Goals of care: Advanced Directive information Advanced Directives 09/28/2015  Does patient have an advance directive? Yes  Type of Advance Directive [place]  Does patient want to make changes to advanced directive? No - Patient declined  Copy of advanced directive(s) in chart? Yes     Chief Complaint  Patient presents with  . Medical Management of Chronic Issues    Routine Visit    HPI:  Pt is a 82 y.o. male seen today for medical management of chronic diseases.  HTN (hypertension) (Chronic). Blood pressure is controlled, takes Norvasc and Carvedilol. Atrial fibrillation   (Chronic). Heart rate is in control, takes Amiodarone, Norvasc, and Carvedilol. Takes Eliquis for thromboembolic risk reduction. Chronic diastolic congestive heart failure (Chronic). Compensated, clear lungs, and no apparent peripheral edema, takes Furosemide and Spironolactone. Memory loss (Chronic). Takes Aricept, admitted to [facility] related to progression of the disease and he is no longer self sufficient in AL setting. Hypothyroidism (Chronic). Takes Levothyroxine 172mcg daily. Last TSH 5.31 08/16/15. Malignant neoplasm of prostate. Sleeps 8 hours at night without urination.  Also no urinary retention. Vasomotor rhinitis - Been several month, associated with eating mainly at dinner time, clear nasal drainage and post nasal drainage, denied chest pain or cough at night or SOB. Flonase with little effect. Will try Atrovent nasal spray I each nostril ac dinner. Also may treat possible GERD with Omeprazole.   Past Medical History  Diagnosis Date  . Hypertension   . Hyperlipidemia   . Aortic stenosis     Moderately Severe by echo 07/2011  . 1St degree AV block   . Shortness of breath   . Angina   . Chronic kidney disease     renal insufficiency  . Pleural effusion     07/2011 - Left > Right - tapped (L) - Transudative  . Atrial fibrillation ([place])     07/2011 - amio and Rivaroxaban initiated - tee/dccv  . Diastolic CHF, chronic ([place])     07/2011 NL EF by echo  . Orthostatic hypotension   . Coronary artery disease 01/19/2012  . Cardiac pacemaker in situ 06/01/2012  . Unspecified hypothyroidism 09/24/2011  . Anxiety state, unspecified 09/24/2011  . Unspecified cataract 02/04/2011  . Disturbance of salivary secretion 07/25/2009  . Edema 09/05/2008  . Shortness of breath 09/05/2008  . 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 Insomnia, unspecified 05/09/2005  . Urinary frequency 05/09/2005  . Malignant neoplasm of prostate ([place]) 03/04/2005  . Hypertrophy of prostate without urinary obstruction and other lower urinary tract symptoms (LUTS) 02/12/2005  . Other malaise and fatigue 02/12/2005  . Nocturia 02/22/2003  . Disturbances of sensation of smell and taste 03/16/2001  . Other specified cardiac dysrhythmias(427.89)  11/19/1999  . Anemia, unspecified   . Deaf   . Tremor 10/29/2013  . Memory loss 10/29/2013  . Hyperglycemia    Past Surgical History  Procedure Laterality Date  . Tee without cardioversion  07/17/2011    Procedure: TRANSESOPHAGEAL ECHOCARDIOGRAM (TEE);  Surgeon: Lelon Perla, MD;  Location: [facility] ENDOSCOPY;  Service: Cardiovascular;  Laterality:  N/A;  With Cardioversion  . Hemorroidectomy  1968  . Spine surgery  1993    HNP(RUPTURED DISC)[name]  . Prostate surgery  05/26/2005    Dr. Terance Hart  . Eye surgery  2008    left eye ( Dr. Charise Killian)  . Esophagogastroduodenoscopy N/A 04/15/2013    Procedure: ESOPHAGOGASTRODUODENOSCOPY (EGD);  Surgeon: Cleotis Nipper, MD;  Location: [facility] ENDOSCOPY;  Service: Endoscopy;  Laterality: N/A;  . Esophagogastroduodenoscopy N/A 04/18/2013    Procedure: ESOPHAGOGASTRODUODENOSCOPY (EGD);  Surgeon: Wonda Horner, MD;  Location: [facility] ENDOSCOPY;  Service: Endoscopy;  Laterality: N/A;    Allergies  Allergen Reactions  . Mold Extract [Trichophyton]       Medication List       This list is accurate as of: 09/28/15 11:59 PM.  Always use your most recent med list.               ALPRAZolam 0.25 MG tablet  Commonly known as:  XANAX  TAKE 1 TABLET UP TO 3 TIMES A DAY AS NEEDED FOR NERVES.     amiodarone 200 MG tablet  Commonly known as:  PACERONE  TAKE 1 TABLET ONCE DAILY.     amLODipine 10 MG tablet  Commonly known as:  NORVASC  TAKE (1) TABLET DAILY FOR HIGH BLOOD PRESSURE.     atorvastatin 20 MG tablet  Commonly known as:  LIPITOR  Take one tablet by mouth once daily for cholesterol     carvedilol 25 MG tablet  Commonly known as:  COREG  TAKE 2 TABLETS BY MOUTH EVERY 12 HOURS FOR BLOOD PRESSURE.     donepezil 10 MG tablet  Commonly known as:  ARICEPT  TAKE 1 TABLET ONCE DAILY FOR MEMORY.     doxazosin 2 MG tablet  Commonly known as:  CARDURA  TAKE 1 TABLET DAILY TO REDUCE URINARY FREQUENCY.     ELIQUIS 2.5 MG Tabs tablet  Generic drug:  apixaban  TAKE 1 TABLET TWICE A DAY FOR ANTI COAGULATION.     FISH OIL PO  Take 1 tablet by mouth every evening. Takes 200 mg tablet by mouth daily.     furosemide 40 MG tablet  Commonly known as:  LASIX  TAKE 1 TABLET ONCE DAILY TO CONTROL EDEMA.     haloperidol 0.5 MG tablet  Commonly known as:  HALDOL  Take 0.5 mg by mouth 2  (two) times daily.     levothyroxine 100 MCG tablet  Commonly known as:  SYNTHROID, LEVOTHROID  TAKE 1 TABLET DAILY FOR THYROID.     mupirocin ointment 2 %  Commonly known as:  BACTROBAN  Apply as directed to arm     NAMENDA XR 28 MG Cp24 24 hr capsule  Generic drug:  memantine  Take 28 mg by mouth daily.     omeprazole 20 MG capsule  Commonly known as:  PRILOSEC  Take 1 capsule (20 mg total) by mouth daily. For stomach     senna 8.6 MG Tabs tablet  Commonly known as:  SENOKOT  Take 2 tablets by mouth at bedtime.     spironolactone 25 MG tablet  Commonly known as:  ALDACTONE  TAKE 1 TABLET BY MOUTH NIGHTLY     VITAMIN B-12 PO  Take 1 tablet by mouth every evening.     zolpidem 10 MG tablet  Commonly known as:  AMBIEN  TAKE 1 TABLET AT BEDTIME AS NEEDED FOR SLEEP.        Review of Systems  Constitutional: Positive for fatigue. Negative for activity change, appetite change and unexpected weight change.  HENT: Positive for hearing loss and tinnitus. Negative for congestion and ear pain.        Clear nasal drainage associated with eating mainly at dinner time.   Eyes: Negative.   Respiratory: Negative for cough, chest tightness and shortness of breath.        History of dyspnea in the past typically with exertion. He feels he is doing better with his.  Cardiovascular: Positive for leg swelling. Negative for chest pain and palpitations.       History of aortic valve replacement. History of pacemaker implantation. 1+ edema BLE  Gastrointestinal: Positive for constipation. Negative for abdominal pain and abdominal distention.       Poor appetite.  Endocrine: Negative.   Genitourinary:       History prostate cancer, urinary frequency, and urgency. He is impotent. He has nocturia x2-3. In the daytime he voids every 90 minutes.  Musculoskeletal: Negative.  Negative for neck pain and neck stiffness.  Skin: Positive for pallor. Negative for rash and wound.       New skin tear  left forearm appears more irritated according to wife and patient.  Neurological: Positive for tremors. Negative for syncope, weakness and numbness.       Memory loss  Hematological: Negative.   Psychiatric/Behavioral: Positive for confusion. The patient is nervous/anxious.     Immunization History  Administered Date(s) Administered  . Influenza Whole 07/14/2009  . Influenza,inj,Quad PF,36+ Mos 04/13/2013, 06/21/2015  . Influenza-Unspecified 04/13/2014  . PPD Test 09/05/2015  . Pneumococcal Conjugate-13 07/14/2005  . Td 02/29/2012   Pertinent  Health Maintenance Due  Topic Date Due  . PNA vac Low Risk Adult (2 of 2 - PPSV23) 07/14/2006  . INFLUENZA VACCINE  02/12/2016   Fall Risk  03/16/2015 02/15/2015 04/13/2014 01/05/2013  Falls in the past year? No No No No   Functional Status Survey:    Filed Vitals:   09/28/15 1118  BP: 124/78  Pulse: 72  Temp: 97.1 F (36.2 C)  TempSrc: Oral  Resp: 20  Height: 6\' 2"  (1.88 m)  Weight: 224 lb 9.6 oz (101.878 kg)   Body mass index is 28.82 kg/(m^2). Physical Exam  Constitutional: He is oriented to person, place, and time. He appears well-developed and well-nourished. No distress.  HENT:  Head: Normocephalic and atraumatic.  Nose: Nose normal.  Mouth/Throat: Oropharynx is clear and moist.  Partial deafness bilaterally.  Eyes: Conjunctivae and EOM are normal. Pupils are equal, round, and reactive to light.  Wears corrective lenses.  Neck: No JVD present. No tracheal deviation present. No thyromegaly present.  Cardiovascular: Normal rate, regular rhythm and intact distal pulses.  Exam reveals no gallop and no friction rub.   2/6 systolic ejection murmur at the base of the heart. Pacemaker left upper anterior chest. Audible systolic click.  Pulmonary/Chest: No respiratory distress. He has no wheezes. He exhibits no tenderness.  Breathing is even and unlabored at rest.  Abdominal: He exhibits no distension and no mass. There is no  tenderness.  Musculoskeletal: Normal range of motion. He  exhibits edema. He exhibits no tenderness.  1+ edema BLE LLE>RLL  Lymphadenopathy:    He has no cervical adenopathy.  Neurological: He is alert and oriented to person, place, and time. No cranial nerve deficit. Coordination normal.  Resting tremor bilaterally. Worse on the right side. 01/17/2009 MMSE 25/30. Passed clock drawing. 10/26/14 MMSE 23/30. Passed clock drawing.  Skin: No rash noted. No erythema. No pallor.  Multiple seborrheic keratoses. Common Nevus left neck posterior to the ear.  Psychiatric: He has a normal mood and affect. His behavior is normal. Judgment and thought content normal.    Labs reviewed:  Recent Labs  03/16/15 1455  08/16/15 08/18/15 1000 09/06/15  NA 140  < > 131* 130* 137  K 4.8  < > 5.1 5.2* 4.4  CL 95*  --   --  96*  --   CO2 26  --   --  21*  --   GLUCOSE 106*  --   --  97  --   BUN 26  < > 27* 31* 29*  CREATININE 2.13*  < > 1.8* 1.87* 1.8*  CALCIUM 9.5  --   --  9.0  --   < > = values in this interval not displayed.  Recent Labs  03/16/15 1455 06/14/15 08/16/15 08/18/15 1000  AST 28 53* 43* 77*  ALT 39 69* 59* 61  ALKPHOS 47 43 48 52  BILITOT 0.7  --   --  1.1  PROT 7.1  --   --  6.9  ALBUMIN 4.6  --   --  4.2    Recent Labs  03/16/15 1455 06/14/15 08/16/15 08/18/15 1000  WBC 6.4 9.0 5.7 11.3*  NEUTROABS 4.5  --   --   --   HGB  --  13.2* 12.7* 14.0  HCT 43.9 40* 39* 43.4  MCV 89  --   --  89.3  PLT 155 131* 139* 149*   Lab Results  Component Value Date   TSH 5.31 08/16/2015   Lab Results  Component Value Date   HGBA1C 6.0 06/14/2015   Lab Results  Component Value Date   CHOL 127 10/19/2014   HDL 32* 10/19/2014   LDLCALC 67 10/19/2014   TRIG 138 10/19/2014   CHOLHDL 3.2 01/03/2013    Significant Diagnostic Results in last 30 days:  No results found.  Assessment/Plan  Anemia Stable, last Hgb 14/08/18/15  Atrial fibrillation   Heart rate is in control,  takes Amiodarone, Norvasc, and Carvedilol. Takes Eliquis for thromboembolic risk reduction.   Chronic diastolic congestive heart failure Compensated, clear lungs, and no apparent peripheral edema, continue Furosemide and Spironolactone.  Chronic kidney disease, stage IV (severe) ([place]) 08/18/15 creat near 2 ED, s/p IVF. 09/06/15 creat 1.76  Dementia with behavioral disturbance Admitted to SNF, [facility] for care needs, continue Aricept, last MMSE 20/30 a few weeks ago, 18/30 09/05/15  GERD (gastroesophageal reflux disease) Stable, continue Omeprazole 20mg  daily.   HTN (hypertension) Blood pressure is controlled, continue Norvasc and Carvedilol  Hypothyroidism Continue Levothyroxine 165mcg daily. 08/16/15 TSH 5.31. Monitor. TSH in 8 weeks.   Memory loss Continue SNF, [facility] for care needs, continue Aricept, last MMSE 20/30 a few weeks ago, 18/30 09/05/15  Nocturia Urine frequency, continue Doxazosin    Family/ staff Communication: continue SNF for care needs  Labs/tests ordered:  none

## 2015-09-27 NOTE — Assessment & Plan Note (Signed)
Admitted to SNF, [facility] for care needs, continue Aricept, last MMSE 20/30 a few weeks ago, 18/30 09/05/15

## 2015-09-27 NOTE — Assessment & Plan Note (Signed)
Urine frequency, continue Doxazosin

## 2015-09-27 NOTE — Assessment & Plan Note (Signed)
Stable, last Hgb 14/08/18/15

## 2015-09-27 NOTE — Assessment & Plan Note (Signed)
Heart rate is in control, takes Amiodarone, Norvasc, and Carvedilol. Takes Eliquis for thromboembolic risk reduction.  

## 2015-09-28 ENCOUNTER — Encounter: Payer: Self-pay | Admitting: Nurse Practitioner

## 2015-09-28 ENCOUNTER — Non-Acute Institutional Stay (SKILLED_NURSING_FACILITY): Payer: Medicare Other | Admitting: Nurse Practitioner

## 2015-09-28 DIAGNOSIS — E039 Hypothyroidism, unspecified: Secondary | ICD-10-CM

## 2015-09-28 DIAGNOSIS — I5032 Chronic diastolic (congestive) heart failure: Secondary | ICD-10-CM | POA: Diagnosis not present

## 2015-09-28 DIAGNOSIS — I4891 Unspecified atrial fibrillation: Secondary | ICD-10-CM

## 2015-09-28 DIAGNOSIS — R413 Other amnesia: Secondary | ICD-10-CM

## 2015-09-28 DIAGNOSIS — I10 Essential (primary) hypertension: Secondary | ICD-10-CM

## 2015-09-28 DIAGNOSIS — N184 Chronic kidney disease, stage 4 (severe): Secondary | ICD-10-CM | POA: Diagnosis not present

## 2015-09-28 DIAGNOSIS — F03918 Unspecified dementia, unspecified severity, with other behavioral disturbance: Secondary | ICD-10-CM

## 2015-09-28 DIAGNOSIS — K219 Gastro-esophageal reflux disease without esophagitis: Secondary | ICD-10-CM | POA: Diagnosis not present

## 2015-09-28 DIAGNOSIS — D649 Anemia, unspecified: Secondary | ICD-10-CM | POA: Diagnosis not present

## 2015-09-28 DIAGNOSIS — F0391 Unspecified dementia with behavioral disturbance: Secondary | ICD-10-CM

## 2015-09-28 DIAGNOSIS — R351 Nocturia: Secondary | ICD-10-CM

## 2015-10-05 ENCOUNTER — Non-Acute Institutional Stay (SKILLED_NURSING_FACILITY): Payer: Medicare Other | Admitting: Nurse Practitioner

## 2015-10-05 ENCOUNTER — Encounter: Payer: Self-pay | Admitting: Nurse Practitioner

## 2015-10-05 DIAGNOSIS — F03918 Unspecified dementia, unspecified severity, with other behavioral disturbance: Secondary | ICD-10-CM

## 2015-10-05 DIAGNOSIS — I5032 Chronic diastolic (congestive) heart failure: Secondary | ICD-10-CM | POA: Diagnosis not present

## 2015-10-05 DIAGNOSIS — F0391 Unspecified dementia with behavioral disturbance: Secondary | ICD-10-CM

## 2015-10-05 DIAGNOSIS — I4891 Unspecified atrial fibrillation: Secondary | ICD-10-CM | POA: Diagnosis not present

## 2015-10-05 DIAGNOSIS — E039 Hypothyroidism, unspecified: Secondary | ICD-10-CM | POA: Diagnosis not present

## 2015-10-05 DIAGNOSIS — R413 Other amnesia: Secondary | ICD-10-CM

## 2015-10-05 DIAGNOSIS — R351 Nocturia: Secondary | ICD-10-CM

## 2015-10-05 DIAGNOSIS — K219 Gastro-esophageal reflux disease without esophagitis: Secondary | ICD-10-CM

## 2015-10-05 DIAGNOSIS — N184 Chronic kidney disease, stage 4 (severe): Secondary | ICD-10-CM

## 2015-10-05 DIAGNOSIS — D649 Anemia, unspecified: Secondary | ICD-10-CM | POA: Diagnosis not present

## 2015-10-05 DIAGNOSIS — I10 Essential (primary) hypertension: Secondary | ICD-10-CM | POA: Diagnosis not present

## 2015-10-05 LAB — BASIC METABOLIC PANEL
BUN: 32 mg/dL — AB (ref 4–21)
Creatinine: 1.8 mg/dL — AB (ref 0.6–1.3)
GLUCOSE: 125 mg/dL
POTASSIUM: 5 mmol/L (ref 3.4–5.3)
SODIUM: 134 mmol/L — AB (ref 137–147)

## 2015-10-05 LAB — CBC AND DIFFERENTIAL
HCT: 36 % — AB (ref 41–53)
HEMOGLOBIN: 11.4 g/dL — AB (ref 13.5–17.5)
Platelets: 127 10*3/uL — AB (ref 150–399)
WBC: 5.2 10^3/mL

## 2015-10-05 NOTE — Assessment & Plan Note (Signed)
Continue SNF, [facility] for care needs, continue Aricept, last MMSE 20/30 a few weeks ago, 18/30 09/05/15

## 2015-10-05 NOTE — Assessment & Plan Note (Signed)
Stable, continue Omeprazole 20mg daily.  

## 2015-10-05 NOTE — Assessment & Plan Note (Signed)
Admitted to SNF, [facility] for care needs, continue Aricept, last MMSE 20/30 a few weeks ago, 18/30 09/05/15

## 2015-10-05 NOTE — Assessment & Plan Note (Signed)
Continue Levothyroxine 142mcg daily. 08/16/15 TSH 5.31. Monitor. TSH in 8 weeks.

## 2015-10-05 NOTE — Assessment & Plan Note (Addendum)
08/18/15 creat near 2 ED, s/p IVF. 09/06/15 creat 1.76 10/05/15 creat 1.75 Update BMP

## 2015-10-05 NOTE — Progress Notes (Signed)
Patient ID: Barry Thompson, male   DOB: 08/02/1923, 82 y.o.   MRN: [id number]  Location:  [facility] Room Number: 111 Place of Service:  SNF (31) Provider: Lennie Odor Destynee Stringfellow NP  GREEN, Viviann Spare, MD  Patient Care Team: Estill Dooms, MD as PCP - General (Internal Medicine) Anson Crofts, MD as Attending Physician (Cardiothoracic Surgery)  Extended Emergency Contact Information Primary Emergency Contact: Bohman,[name] Address: [address] [place]          [place], [place] [address] [place] Phone: [phone] Relation: Spouse Secondary Emergency Contact: [place], [place] [address] [place] Phone: [phone] Relation: Son  Code Status:  DNR Goals of care: Advanced Directive information Advanced Directives 10/05/2015  Does patient have an advance directive? Yes  Type of Advance Directive [place]  Does patient want to make changes to advanced directive? No - Patient declined  Copy of advanced directive(s) in chart? Yes     Chief Complaint  Patient presents with  . Medical Management of Chronic Issues    Routine visit    HPI:  Pt is a 82 y.o. male seen today for medical management of chronic diseases.  HTN (hypertension) (Chronic). Blood pressure is controlled, takes Norvasc and Carvedilol. Atrial fibrillation   (Chronic). Heart rate is in control, takes Amiodarone, Norvasc, and Carvedilol. Takes Eliquis for thromboembolic risk reduction. Chronic diastolic congestive heart failure (Chronic). Decompensating, , clear lungs, and increased peripheral edema, takes Furosemide 40mg  bid in the past 4 days and Spironolactone 25mg . Memory loss (Chronic). Takes Aricept, admitted to [facility] related to progression of the disease and he is no longer self sufficient in AL setting. Hypothyroidism (Chronic). Takes Levothyroxine 193mcg daily. Last TSH 5.31 08/16/15. Malignant neoplasm of prostate. Sleeps 8  hours at night without urination. Also no urinary retention. Vasomotor rhinitis - Been several month, associated with eating mainly at dinner time, clear nasal drainage and post nasal drainage, denied chest pain or cough at night or SOB. Flonase with little effect. Atrovent nasal spray I each nostril ac dinner. Also may treat possible GERD with Omeprazole.   Past Medical History  Diagnosis Date  . Hypertension   . Hyperlipidemia   . Aortic stenosis     Moderately Severe by echo 07/2011  . 1St degree AV block   . Shortness of breath   . Angina   . Chronic kidney disease     renal insufficiency  . Pleural effusion     07/2011 - Left > Right - tapped (L) - Transudative  . Atrial fibrillation ([place])     07/2011 - amio and Rivaroxaban initiated - tee/dccv  . Diastolic CHF, chronic ([place])     07/2011 NL EF by echo  . Orthostatic hypotension   . Coronary artery disease 01/19/2012  . Cardiac pacemaker in situ 06/01/2012  . Unspecified hypothyroidism 09/24/2011  . Anxiety state, unspecified 09/24/2011  . Unspecified cataract 02/04/2011  . Disturbance of salivary secretion 07/25/2009  . Edema 09/05/2008  . Shortness of breath 09/05/2008  . 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 Insomnia, unspecified 05/09/2005  . Urinary frequency 05/09/2005  . Malignant neoplasm of prostate ([place]) 03/04/2005  . Hypertrophy of prostate without urinary obstruction and other lower urinary tract symptoms (LUTS) 02/12/2005  . Other malaise and fatigue 02/12/2005  . Nocturia 02/22/2003  . Disturbances of sensation of smell and taste 03/16/2001  .  Other specified cardiac dysrhythmias(427.89) 11/19/1999  . Anemia, unspecified   . Deaf   . Tremor 10/29/2013  . Memory loss 10/29/2013  . Hyperglycemia    Past Surgical History  Procedure Laterality Date  . Tee without cardioversion  07/17/2011    Procedure: TRANSESOPHAGEAL ECHOCARDIOGRAM (TEE);  Surgeon: Lelon Perla, MD;  Location: [facility] ENDOSCOPY;  Service:  Cardiovascular;  Laterality: N/A;  With Cardioversion  . Hemorroidectomy  1968  . Spine surgery  1993    HNP(RUPTURED DISC)[name]  . Prostate surgery  05/26/2005    Dr. Terance Hart  . Eye surgery  2008    left eye ( Dr. Charise Killian)  . Esophagogastroduodenoscopy N/A 04/15/2013    Procedure: ESOPHAGOGASTRODUODENOSCOPY (EGD);  Surgeon: Cleotis Nipper, MD;  Location: [facility] ENDOSCOPY;  Service: Endoscopy;  Laterality: N/A;  . Esophagogastroduodenoscopy N/A 04/18/2013    Procedure: ESOPHAGOGASTRODUODENOSCOPY (EGD);  Surgeon: Wonda Horner, MD;  Location: [facility] ENDOSCOPY;  Service: Endoscopy;  Laterality: N/A;    Allergies  Allergen Reactions  . Mold Extract [Trichophyton]       Medication List       This list is accurate as of: 10/05/15  4:46 PM.  Always use your most recent med list.               ALPRAZolam 0.25 MG tablet  Commonly known as:  XANAX  TAKE 1 TABLET UP TO 3 TIMES A DAY AS NEEDED FOR NERVES.     amiodarone 200 MG tablet  Commonly known as:  PACERONE  TAKE 1 TABLET ONCE DAILY.     amLODipine 10 MG tablet  Commonly known as:  NORVASC  TAKE (1) TABLET DAILY FOR HIGH BLOOD PRESSURE.     atorvastatin 20 MG tablet  Commonly known as:  LIPITOR  Take one tablet by mouth once daily for cholesterol     carvedilol 25 MG tablet  Commonly known as:  COREG  TAKE 2 TABLETS BY MOUTH EVERY 12 HOURS FOR BLOOD PRESSURE.     donepezil 10 MG tablet  Commonly known as:  ARICEPT  TAKE 1 TABLET ONCE DAILY FOR MEMORY.     doxazosin 2 MG tablet  Commonly known as:  CARDURA  TAKE 1 TABLET DAILY TO REDUCE URINARY FREQUENCY.     ELIQUIS 2.5 MG Tabs tablet  Generic drug:  apixaban  TAKE 1 TABLET TWICE A DAY FOR ANTI COAGULATION.     FISH OIL PO  Take 1 tablet by mouth every evening. Takes 200 mg tablet by mouth daily.     furosemide 40 MG tablet  Commonly known as:  LASIX  TAKE 1 TABLET ONCE DAILY TO CONTROL EDEMA.     haloperidol 0.5 MG tablet  Commonly known as:  HALDOL    Take 0.5 mg by mouth 2 (two) times daily.     levothyroxine 100 MCG tablet  Commonly known as:  SYNTHROID, LEVOTHROID  TAKE 1 TABLET DAILY FOR THYROID.     mupirocin ointment 2 %  Commonly known as:  BACTROBAN  Apply as directed to arm     NAMENDA XR 28 MG Cp24 24 hr capsule  Generic drug:  memantine  Take 28 mg by mouth daily.     omeprazole 20 MG capsule  Commonly known as:  PRILOSEC  Take 1 capsule (20 mg total) by mouth daily. For stomach     senna 8.6 MG Tabs tablet  Commonly known as:  SENOKOT  Take 2 tablets by mouth at bedtime.  spironolactone 25 MG tablet  Commonly known as:  ALDACTONE  TAKE 1 TABLET BY MOUTH NIGHTLY     VITAMIN B-12 PO  Take 1 tablet by mouth every evening.     zolpidem 10 MG tablet  Commonly known as:  AMBIEN  TAKE 1 TABLET AT BEDTIME AS NEEDED FOR SLEEP.        Review of Systems  Constitutional: Positive for fatigue and unexpected weight change. Negative for activity change and appetite change.       Weight gain  HENT: Positive for hearing loss and tinnitus. Negative for congestion and ear pain.        Clear nasal drainage associated with eating mainly at dinner time.   Eyes: Negative.   Respiratory: Negative for cough, chest tightness and shortness of breath.        History of dyspnea in the past typically with exertion. He feels he is doing better with his.  Cardiovascular: Positive for leg swelling. Negative for chest pain and palpitations.       History of aortic valve replacement. History of pacemaker implantation. 1+ edema BLE  Gastrointestinal: Positive for constipation. Negative for abdominal pain and abdominal distention.       Poor appetite.  Endocrine: Negative.   Genitourinary:       History prostate cancer, urinary frequency, and urgency. He is impotent. He has nocturia x2-3. In the daytime he voids every 90 minutes.  Musculoskeletal: Negative.  Negative for neck pain and neck stiffness.  Skin: Positive for pallor.  Negative for rash and wound.       New skin tear left forearm appears more irritated according to wife and patient.  Neurological: Positive for tremors. Negative for syncope, weakness and numbness.       Memory loss  Hematological: Negative.   Psychiatric/Behavioral: Positive for confusion. The patient is nervous/anxious.     Immunization History  Administered Date(s) Administered  . Influenza Whole 07/14/2009  . Influenza,inj,Quad PF,36+ Mos 04/13/2013, 06/21/2015  . Influenza-Unspecified 04/13/2014  . PPD Test 09/05/2015  . Pneumococcal Conjugate-13 07/14/2005  . Td 02/29/2012   Pertinent  Health Maintenance Due  Topic Date Due  . PNA vac Low Risk Adult (2 of 2 - PPSV23) 07/14/2006  . INFLUENZA VACCINE  02/12/2016   Fall Risk  03/16/2015 02/15/2015 04/13/2014 01/05/2013  Falls in the past year? No No No No   Functional Status Survey:    Filed Vitals:   10/05/15 1135  BP: 120/80  Pulse: 56  Temp: 96.6 F (35.9 C)  TempSrc: Oral  Resp: 20  Height: 6\' 2"  (1.88 m)  Weight: 224 lb 9.6 oz (101.878 kg)  SpO2: 96%   Body mass index is 28.82 kg/(m^2). Physical Exam  Constitutional: He is oriented to person, place, and time. He appears well-developed and well-nourished. No distress.  HENT:  Head: Normocephalic and atraumatic.  Nose: Nose normal.  Mouth/Throat: Oropharynx is clear and moist.  Partial deafness bilaterally.  Eyes: Conjunctivae and EOM are normal. Pupils are equal, round, and reactive to light.  Wears corrective lenses.  Neck: No JVD present. No tracheal deviation present. No thyromegaly present.  Cardiovascular: Normal rate, regular rhythm and intact distal pulses.  Exam reveals no gallop and no friction rub.   2/6 systolic ejection murmur at the base of the heart. Pacemaker left upper anterior chest. Audible systolic click.  Pulmonary/Chest: No respiratory distress. He has no wheezes. He exhibits no tenderness.  Breathing is even and unlabored at rest.    Abdominal:  He exhibits no distension and no mass. There is no tenderness.  Musculoskeletal: Normal range of motion. He exhibits edema. He exhibits no tenderness.  2+ edema BLE LLE>RLL  Lymphadenopathy:    He has no cervical adenopathy.  Neurological: He is alert and oriented to person, place, and time. No cranial nerve deficit. Coordination normal.  Resting tremor bilaterally. Worse on the right side. 01/17/2009 MMSE 25/30. Passed clock drawing. 10/26/14 MMSE 23/30. Passed clock drawing.  Skin: No rash noted. No erythema. No pallor.  Multiple seborrheic keratoses. Common Nevus left neck posterior to the ear.  Psychiatric: He has a normal mood and affect. His behavior is normal. Judgment and thought content normal.    Labs reviewed:  Recent Labs  03/16/15 1455  08/16/15 08/18/15 1000 09/06/15  NA 140  < > 131* 130* 137  K 4.8  < > 5.1 5.2* 4.4  CL 95*  --   --  96*  --   CO2 26  --   --  21*  --   GLUCOSE 106*  --   --  97  --   BUN 26  < > 27* 31* 29*  CREATININE 2.13*  < > 1.8* 1.87* 1.8*  CALCIUM 9.5  --   --  9.0  --   < > = values in this interval not displayed.  Recent Labs  03/16/15 1455 06/14/15 08/16/15 08/18/15 1000  AST 28 53* 43* 77*  ALT 39 69* 59* 61  ALKPHOS 47 43 48 52  BILITOT 0.7  --   --  1.1  PROT 7.1  --   --  6.9  ALBUMIN 4.6  --   --  4.2    Recent Labs  03/16/15 1455 06/14/15 08/16/15 08/18/15 1000  WBC 6.4 9.0 5.7 11.3*  NEUTROABS 4.5  --   --   --   HGB  --  13.2* 12.7* 14.0  HCT 43.9 40* 39* 43.4  MCV 89  --   --  89.3  PLT 155 131* 139* 149*   Lab Results  Component Value Date   TSH 5.31 08/16/2015   Lab Results  Component Value Date   HGBA1C 6.0 06/14/2015   Lab Results  Component Value Date   CHOL 127 10/19/2014   HDL 32* 10/19/2014   LDLCALC 67 10/19/2014   TRIG 138 10/19/2014   CHOLHDL 3.2 01/03/2013    Significant Diagnostic Results in last 30 days:  No results found.  Assessment/Plan  Anemia Stable, last Hgb  14/08/18/15, 11.4 10/05/15  Atrial fibrillation   Heart rate is in control, takes Amiodarone, Norvasc, and Carvedilol. Takes Eliquis for thromboembolic risk reduction  Chronic diastolic congestive heart failure (HCC) Decompensating, weight gain, increased  peripheral edema, continue Furosemide 40mg  bid and Spironolactone 25mg , CXR 10/04/15 CHF, f/u BMP, BNP   Chronic kidney disease, stage IV (severe) ([place]) 08/18/15 creat near 2 ED, s/p IVF. 09/06/15 creat 1.76 10/05/15 creat 1.75 Update BMP  Dementia with behavioral disturbance Admitted to SNF, [facility] for care needs, continue Aricept, last MMSE 20/30 a few weeks ago, 18/30 09/05/15  GERD (gastroesophageal reflux disease) Stable, continue Omeprazole 20mg  daily.   HTN (hypertension) Blood pressure is controlled, continue Norvasc and Carvedilol  Hypothyroidism Continue Levothyroxine 15mcg daily. 08/16/15 TSH 5.31. Monitor. TSH in 8 weeks.   Memory loss Continue SNF, [facility] for care needs, continue Aricept, last MMSE 20/30 a few weeks ago, 18/30 09/05/15  Nocturia Urine frequency, continue Doxazosin    Family/ staff Communication: continue SNF for  care needs  Labs/tests ordered:  BMP, BNP

## 2015-10-05 NOTE — Assessment & Plan Note (Signed)
Blood pressure is controlled, continue Norvasc and Carvedilol

## 2015-10-05 NOTE — Assessment & Plan Note (Addendum)
Stable, last Hgb 14/08/18/15, 11.4 10/05/15

## 2015-10-05 NOTE — Assessment & Plan Note (Signed)
Heart rate is in control, takes Amiodarone, Norvasc, and Carvedilol. Takes Eliquis for thromboembolic risk reduction.  

## 2015-10-05 NOTE — Assessment & Plan Note (Signed)
Urine frequency, continue Doxazosin

## 2015-10-05 NOTE — Assessment & Plan Note (Signed)
Decompensating, weight gain, increased  peripheral edema, continue Furosemide 40mg  bid and Spironolactone 25mg , CXR 10/04/15 CHF, f/u BMP, BNP

## 2015-10-09 LAB — BASIC METABOLIC PANEL
BUN: 62 mg/dL — AB (ref 4–21)
CREATININE: 2.8 mg/dL — AB (ref 0.6–1.3)
Glucose: 66 mg/dL
POTASSIUM: 4.9 mmol/L (ref 3.4–5.3)
SODIUM: 138 mmol/L (ref 137–147)

## 2015-10-12 ENCOUNTER — Non-Acute Institutional Stay (SKILLED_NURSING_FACILITY): Payer: Medicare Other | Admitting: Nurse Practitioner

## 2015-10-12 ENCOUNTER — Encounter: Payer: Self-pay | Admitting: Nurse Practitioner

## 2015-10-12 DIAGNOSIS — E039 Hypothyroidism, unspecified: Secondary | ICD-10-CM

## 2015-10-12 DIAGNOSIS — N184 Chronic kidney disease, stage 4 (severe): Secondary | ICD-10-CM | POA: Diagnosis not present

## 2015-10-12 DIAGNOSIS — I10 Essential (primary) hypertension: Secondary | ICD-10-CM

## 2015-10-12 DIAGNOSIS — I4891 Unspecified atrial fibrillation: Secondary | ICD-10-CM | POA: Diagnosis not present

## 2015-10-12 DIAGNOSIS — F03918 Unspecified dementia, unspecified severity, with other behavioral disturbance: Secondary | ICD-10-CM

## 2015-10-12 DIAGNOSIS — F0391 Unspecified dementia with behavioral disturbance: Secondary | ICD-10-CM

## 2015-10-12 DIAGNOSIS — I5032 Chronic diastolic (congestive) heart failure: Secondary | ICD-10-CM | POA: Diagnosis not present

## 2015-10-12 DIAGNOSIS — K219 Gastro-esophageal reflux disease without esophagitis: Secondary | ICD-10-CM

## 2015-10-12 DIAGNOSIS — D649 Anemia, unspecified: Secondary | ICD-10-CM | POA: Diagnosis not present

## 2015-10-12 DIAGNOSIS — R413 Other amnesia: Secondary | ICD-10-CM

## 2015-10-12 NOTE — Assessment & Plan Note (Signed)
Continue SNF, [facility] for care needs, continue Aricept, last MMSE 20/30 a few weeks ago, 18/30 09/05/15

## 2015-10-12 NOTE — Progress Notes (Signed)
Patient ID: Barry Thompson, male   DOB: Oct 16, 1923, 82 y.o.   MRN: [id number]  Location:  [place] Room Number: 111 Place of Service:  SNF (31) Provider: Lennie Odor Dia Donate NP  GREEN, Viviann Spare, MD  Patient Care Team: Estill Dooms, MD as PCP - General (Internal Medicine) Anson Crofts, MD as Attending Physician (Cardiothoracic Surgery)  Extended Emergency Contact Information Primary Emergency Contact: Kobashigawa,[name] Address: [address] [place]          [place], [place] [address] [place] Phone: [phone] Relation: Spouse Secondary Emergency Contact: [place], [place] [address] [place] Phone: [phone] Relation: Son  Code Status:  DNR Goals of care: Advanced Directive information Advanced Directives 10/12/2015  Does patient have an advance directive? Yes  Type of Advance Directive [place]  Does patient want to make changes to advanced directive? -  Copy of advanced directive(s) in chart? Yes     Chief Complaint  Patient presents with  . Medical Management of Chronic Issues    weight gain and medication management    HPI:  Pt is a 82 y.o. male seen today for medical management of chronic diseases.  HTN (hypertension) (Chronic). Blood pressure is controlled, takes Norvasc and Carvedilol. Atrial fibrillation   (Chronic). Heart rate is in control, takes Amiodarone, Norvasc, and Carvedilol. Takes Eliquis for thromboembolic risk reduction. Chronic diastolic congestive heart failure (Chronic). Decompensating, , clear lungs, and increased peripheral edema, takes Furosemide 40mg  bid, Spironolactone 25mg . Memory loss (Chronic). Takes Aricept, admitted to [facility] related to progression of the disease and he is no longer self sufficient in AL setting. Hypothyroidism (Chronic). Takes Levothyroxine 173mcg daily. Last TSH 5.31 08/16/15. Malignant neoplasm of prostate. Sleeps 8 hours at night  without urination. Also no urinary retention. Vasomotor rhinitis - Been several month, associated with eating mainly at dinner time, clear nasal drainage and post nasal drainage, denied chest pain or cough at night or SOB. Flonase with little effect. Atrovent nasal spray I each nostril ac dinner. Also may treat possible GERD with Omeprazole.   Persisted BLE edema, weight #245-#250 in range in the past week, Furosemide 40mg  bid since 10/03/15 and Spironolactone 25mg , no O2 desaturation, no increased cough or SOB, the patient stated he is in his usual state of health.    Past Medical History  Diagnosis Date  . Hypertension   . Hyperlipidemia   . Aortic stenosis     Moderately Severe by echo 07/2011  . 1St degree AV block   . Shortness of breath   . Angina   . Chronic kidney disease     renal insufficiency  . Pleural effusion     07/2011 - Left > Right - tapped (L) - Transudative  . Atrial fibrillation ([place])     07/2011 - amio and Rivaroxaban initiated - tee/dccv  . Diastolic CHF, chronic ([place])     07/2011 NL EF by echo  . Orthostatic hypotension   . Coronary artery disease 01/19/2012  . Cardiac pacemaker in situ 06/01/2012  . Unspecified hypothyroidism 09/24/2011  . Anxiety state, unspecified 09/24/2011  . Unspecified cataract 02/04/2011  . Disturbance of salivary secretion 07/25/2009  . Edema 09/05/2008  . Shortness of breath 09/05/2008  . 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 Insomnia, unspecified 05/09/2005  . Urinary frequency 05/09/2005  . Malignant neoplasm of prostate ([place]) 03/04/2005  .  Hypertrophy of prostate without urinary obstruction and other lower urinary tract symptoms (LUTS) 02/12/2005  . Other malaise and fatigue 02/12/2005  . Nocturia 02/22/2003  . Disturbances of sensation of smell and taste 03/16/2001  . Other specified cardiac dysrhythmias(427.89) 11/19/1999  . Anemia, unspecified   . Deaf   . Tremor 10/29/2013  . Memory loss 10/29/2013  . Hyperglycemia     Past Surgical History  Procedure Laterality Date  . Tee without cardioversion  07/17/2011    Procedure: TRANSESOPHAGEAL ECHOCARDIOGRAM (TEE);  Surgeon: Lelon Perla, MD;  Location: [facility] ENDOSCOPY;  Service: Cardiovascular;  Laterality: N/A;  With Cardioversion  . Hemorroidectomy  1968  . Spine surgery  1993    HNP(RUPTURED DISC)[name]  . Prostate surgery  05/26/2005    Dr. Terance Hart  . Eye surgery  2008    left eye ( Dr. Charise Killian)  . Esophagogastroduodenoscopy N/A 04/15/2013    Procedure: ESOPHAGOGASTRODUODENOSCOPY (EGD);  Surgeon: Cleotis Nipper, MD;  Location: [facility] ENDOSCOPY;  Service: Endoscopy;  Laterality: N/A;  . Esophagogastroduodenoscopy N/A 04/18/2013    Procedure: ESOPHAGOGASTRODUODENOSCOPY (EGD);  Surgeon: Wonda Horner, MD;  Location: [facility] ENDOSCOPY;  Service: Endoscopy;  Laterality: N/A;    Allergies  Allergen Reactions  . Mold Extract [Trichophyton]       Medication List       This list is accurate as of: 10/12/15  2:51 PM.  Always use your most recent med list.               ALPRAZolam 0.25 MG tablet  Commonly known as:  XANAX  TAKE 1 TABLET UP TO 3 TIMES A DAY AS NEEDED FOR NERVES.     amiodarone 200 MG tablet  Commonly known as:  PACERONE  TAKE 1 TABLET ONCE DAILY.     amLODipine 10 MG tablet  Commonly known as:  NORVASC  TAKE (1) TABLET DAILY FOR HIGH BLOOD PRESSURE.     atorvastatin 20 MG tablet  Commonly known as:  LIPITOR  Take one tablet by mouth once daily for cholesterol     carvedilol 25 MG tablet  Commonly known as:  COREG  TAKE 2 TABLETS BY MOUTH EVERY 12 HOURS FOR BLOOD PRESSURE.     donepezil 10 MG tablet  Commonly known as:  ARICEPT  TAKE 1 TABLET ONCE DAILY FOR MEMORY.     doxazosin 2 MG tablet  Commonly known as:  CARDURA  TAKE 1 TABLET DAILY TO REDUCE URINARY FREQUENCY.     ELIQUIS 2.5 MG Tabs tablet  Generic drug:  apixaban  TAKE 1 TABLET TWICE A DAY FOR ANTI COAGULATION.     FISH OIL PO  Take 1 tablet by mouth  every evening. Takes 200 mg tablet by mouth daily.     furosemide 40 MG tablet  Commonly known as:  LASIX  TAKE 1 TABLET ONCE DAILY TO CONTROL EDEMA.     haloperidol 0.5 MG tablet  Commonly known as:  HALDOL  Take 0.5 mg by mouth 2 (two) times daily.     levothyroxine 100 MCG tablet  Commonly known as:  SYNTHROID, LEVOTHROID  TAKE 1 TABLET DAILY FOR THYROID.     mupirocin ointment 2 %  Commonly known as:  BACTROBAN  Apply as directed to arm     NAMENDA XR 28 MG Cp24 24 hr capsule  Generic drug:  memantine  Take 28 mg by mouth daily.     omeprazole 20 MG capsule  Commonly known as:  PRILOSEC  Take  1 capsule (20 mg total) by mouth daily. For stomach     senna 8.6 MG Tabs tablet  Commonly known as:  SENOKOT  Take 2 tablets by mouth at bedtime.     spironolactone 25 MG tablet  Commonly known as:  ALDACTONE  TAKE 1 TABLET BY MOUTH NIGHTLY     VITAMIN B-12 PO  Take 1 tablet by mouth every evening.     zolpidem 10 MG tablet  Commonly known as:  AMBIEN  TAKE 1 TABLET AT BEDTIME AS NEEDED FOR SLEEP.        Review of Systems  Constitutional: Positive for fatigue and unexpected weight change. Negative for activity change and appetite change.       Weight gain  HENT: Positive for hearing loss and tinnitus. Negative for congestion and ear pain.        Clear nasal drainage associated with eating mainly at dinner time.   Eyes: Negative.   Respiratory: Negative for cough, chest tightness and shortness of breath.        History of dyspnea in the past typically with exertion. He feels he is doing better with his.  Cardiovascular: Positive for leg swelling. Negative for chest pain and palpitations.       History of aortic valve replacement. History of pacemaker implantation. 2+ edema BLE  Gastrointestinal: Positive for constipation. Negative for abdominal pain and abdominal distention.       Poor appetite.  Endocrine: Negative.   Genitourinary:       History prostate cancer,  urinary frequency, and urgency. He is impotent. He has nocturia x2-3. In the daytime he voids every 90 minutes.  Musculoskeletal: Negative.  Negative for neck pain and neck stiffness.  Skin: Positive for pallor. Negative for rash and wound.       New skin tear left forearm appears more irritated according to wife and patient.  Neurological: Positive for tremors. Negative for syncope, weakness and numbness.       Memory loss  Hematological: Negative.   Psychiatric/Behavioral: Positive for confusion. The patient is nervous/anxious.     Immunization History  Administered Date(s) Administered  . Influenza Whole 07/14/2009  . Influenza,inj,Quad PF,36+ Mos 04/13/2013, 06/21/2015  . Influenza-Unspecified 04/13/2014  . PPD Test 09/05/2015  . Pneumococcal Conjugate-13 07/14/2005  . Td 02/29/2012   Pertinent  Health Maintenance Due  Topic Date Due  . PNA vac Low Risk Adult (2 of 2 - PPSV23) 07/14/2006  . INFLUENZA VACCINE  02/12/2016   Fall Risk  03/16/2015 02/15/2015 04/13/2014 01/05/2013  Falls in the past year? No No No No   Functional Status Survey:    Filed Vitals:   10/12/15 1234  BP: 120/70  Pulse: 56  Temp: 97.2 F (36.2 C)  TempSrc: Oral  Resp: 20  Height: 6\' 2"  (1.88 m)  Weight: 224 lb (101.606 kg)   Body mass index is 28.75 kg/(m^2). Physical Exam  Constitutional: He is oriented to person, place, and time. He appears well-developed and well-nourished. No distress.  HENT:  Head: Normocephalic and atraumatic.  Nose: Nose normal.  Mouth/Throat: Oropharynx is clear and moist.  Partial deafness bilaterally.  Eyes: Conjunctivae and EOM are normal. Pupils are equal, round, and reactive to light.  Wears corrective lenses.  Neck: No JVD present. No tracheal deviation present. No thyromegaly present.  Cardiovascular: Normal rate, regular rhythm and intact distal pulses.  Exam reveals no gallop and no friction rub.   2/6 systolic ejection murmur at the base of the  heart.  Pacemaker left upper anterior chest. Audible systolic click.  Pulmonary/Chest: No respiratory distress. He has no wheezes. He exhibits no tenderness.  Breathing is even and unlabored at rest.  Abdominal: He exhibits no distension and no mass. There is no tenderness.  Musculoskeletal: Normal range of motion. He exhibits edema. He exhibits no tenderness.  2+ edema BLE LLE>RLL  Lymphadenopathy:    He has no cervical adenopathy.  Neurological: He is alert and oriented to person, place, and time. No cranial nerve deficit. Coordination normal.  Resting tremor bilaterally. Worse on the right side. 01/17/2009 MMSE 25/30. Passed clock drawing. 10/26/14 MMSE 23/30. Passed clock drawing.  Skin: No rash noted. No erythema. No pallor.  Multiple seborrheic keratoses. Common Nevus left neck posterior to the ear.  Psychiatric: He has a normal mood and affect. His behavior is normal. Judgment and thought content normal.    Labs reviewed:  Recent Labs  03/16/15 1455  08/18/15 1000 09/06/15 10/05/15  NA 140  < > 130* 137 134*  K 4.8  < > 5.2* 4.4 5.0  CL 95*  --  96*  --   --   CO2 26  --  21*  --   --   GLUCOSE 106*  --  97  --   --   BUN 26  < > 31* 29* 32*  CREATININE 2.13*  < > 1.87* 1.8* 1.8*  CALCIUM 9.5  --  9.0  --   --   < > = values in this interval not displayed.  Recent Labs  03/16/15 1455 06/14/15 08/16/15 08/18/15 1000  AST 28 53* 43* 77*  ALT 39 69* 59* 61  ALKPHOS 47 43 48 52  BILITOT 0.7  --   --  1.1  PROT 7.1  --   --  6.9  ALBUMIN 4.6  --   --  4.2    Recent Labs  03/16/15 1455  08/16/15 08/18/15 1000 10/05/15  WBC 6.4  < > 5.7 11.3* 5.2  NEUTROABS 4.5  --   --   --   --   HGB  --   < > 12.7* 14.0 11.4*  HCT 43.9  < > 39* 43.4 36*  MCV 89  --   --  89.3  --   PLT 155  < > 139* 149* 127*  < > = values in this interval not displayed. Lab Results  Component Value Date   TSH 5.31 08/16/2015   Lab Results  Component Value Date   HGBA1C 6.0 06/14/2015    Lab Results  Component Value Date   CHOL 127 10/19/2014   HDL 32* 10/19/2014   LDLCALC 67 10/19/2014   TRIG 138 10/19/2014   CHOLHDL 3.2 01/03/2013    Significant Diagnostic Results in last 30 days:  No results found.  Assessment/Plan  Anemia Stable, last Hgb 14/08/18/15, 11.4 10/05/15  Atrial fibrillation   Heart rate is in control, takes Amiodarone, Norvasc, and Carvedilol. Takes Eliquis for thromboembolic risk reduction, dc Norvasc in setting of edema, controlled heart rate, and increased diuretics for BP.   Chronic diastolic congestive heart failure (HCC) Decompensating, weight gain, increased  peripheral edema 2+, continue Furosemide 40mg  bid and Spironolactone 25mg , CXR 10/04/15 CHF. Update BMP. 10/09/15 BNP 415.2, Na 138, K 4.9, Bun 62, creat 2.75. Continue to monitor weight.    Chronic kidney disease, stage IV (severe) ([place]) 08/18/15 creat near 2 ED, s/p IVF. 09/06/15 creat 1.76 10/05/15 creat 1.75 10/09/15 creat 2.75 10/12/15 diuretics are contributory, continue  Furosemide 40mg  bid, Spironolactone 25mg , f/u BMP  Dementia with behavioral disturbance Continue SNF, [facility] for care needs, continue Aricept, last MMSE 20/30 a few weeks ago, 18/30 09/05/15  GERD (gastroesophageal reflux disease) Stable, continue Omeprazole 20mg  daily.   HTN (hypertension) Blood pressure is controlled, dc Norvasc in setting of controlled BP, excessive edema BLE, and controlled heart rate. Continue Carvedilol  Hypothyroidism Continue Levothyroxine 184mcg daily. 08/16/15 TSH 5.31. Monitor. TSH in 8 weeks.   Memory loss Continue SNF, [facility] for care needs, continue Aricept, last MMSE 20/30 a few weeks ago, 18/30 09/05/15    Family/ staff Communication: continue SNF for care needs  Labs/tests ordered:  BMP

## 2015-10-12 NOTE — Assessment & Plan Note (Signed)
Continue Levothyroxine 110mcg daily. 08/16/15 TSH 5.31. Monitor. TSH in 8 weeks.

## 2015-10-12 NOTE — Assessment & Plan Note (Addendum)
Decompensating, weight gain, increased  peripheral edema 2+, continue Furosemide 40mg  bid and Spironolactone 25mg , CXR 10/04/15 CHF. Update BMP. 10/09/15 BNP 415.2, Na 138, K 4.9, Bun 62, creat 2.75. Continue to monitor weight.

## 2015-10-12 NOTE — Assessment & Plan Note (Signed)
Stable, continue Omeprazole 20mg daily.  

## 2015-10-12 NOTE — Assessment & Plan Note (Signed)
08/18/15 creat near 2 ED, s/p IVF. 09/06/15 creat 1.76 10/05/15 creat 1.75 10/09/15 creat 2.75 10/12/15 diuretics are contributory, continue Furosemide 40mg  bid, Spironolactone 25mg , f/u BMP

## 2015-10-12 NOTE — Assessment & Plan Note (Signed)
Blood pressure is controlled, dc Norvasc in setting of controlled BP, excessive edema BLE, and controlled heart rate. Continue Carvedilol

## 2015-10-12 NOTE — Assessment & Plan Note (Signed)
Stable, last Hgb 14/08/18/15, 11.4 10/05/15

## 2015-10-12 NOTE — Assessment & Plan Note (Signed)
Heart rate is in control, takes Amiodarone, Norvasc, and Carvedilol. Takes Eliquis for thromboembolic risk reduction, dc Norvasc in setting of edema, controlled heart rate, and increased diuretics for BP.

## 2015-10-16 LAB — BASIC METABOLIC PANEL
BUN: 57 mg/dL — AB (ref 4–21)
Creatinine: 2.6 mg/dL — AB (ref 0.6–1.3)
Glucose: 60 mg/dL
Potassium: 4.8 mmol/L (ref 3.4–5.3)
Sodium: 139 mmol/L (ref 137–147)

## 2015-10-19 ENCOUNTER — Encounter: Payer: Self-pay | Admitting: Nurse Practitioner

## 2015-10-19 ENCOUNTER — Non-Acute Institutional Stay (SKILLED_NURSING_FACILITY): Payer: Medicare Other | Admitting: Nurse Practitioner

## 2015-10-19 DIAGNOSIS — K219 Gastro-esophageal reflux disease without esophagitis: Secondary | ICD-10-CM | POA: Diagnosis not present

## 2015-10-19 DIAGNOSIS — D649 Anemia, unspecified: Secondary | ICD-10-CM

## 2015-10-19 DIAGNOSIS — F0391 Unspecified dementia with behavioral disturbance: Secondary | ICD-10-CM | POA: Diagnosis not present

## 2015-10-19 DIAGNOSIS — R413 Other amnesia: Secondary | ICD-10-CM

## 2015-10-19 DIAGNOSIS — E039 Hypothyroidism, unspecified: Secondary | ICD-10-CM | POA: Diagnosis not present

## 2015-10-19 DIAGNOSIS — I4891 Unspecified atrial fibrillation: Secondary | ICD-10-CM | POA: Diagnosis not present

## 2015-10-19 DIAGNOSIS — N184 Chronic kidney disease, stage 4 (severe): Secondary | ICD-10-CM | POA: Diagnosis not present

## 2015-10-19 DIAGNOSIS — I5032 Chronic diastolic (congestive) heart failure: Secondary | ICD-10-CM | POA: Diagnosis not present

## 2015-10-19 DIAGNOSIS — I10 Essential (primary) hypertension: Secondary | ICD-10-CM | POA: Diagnosis not present

## 2015-10-19 DIAGNOSIS — F03918 Unspecified dementia, unspecified severity, with other behavioral disturbance: Secondary | ICD-10-CM

## 2015-11-01 ENCOUNTER — Ambulatory Visit: Payer: Medicare Other | Admitting: Cardiovascular Disease

## 2015-11-08 ENCOUNTER — Encounter: Payer: Self-pay | Admitting: Internal Medicine

## 2015-11-12 NOTE — Assessment & Plan Note (Signed)
09/06/15 creat 1.76 10/05/15 creat 1.75 10/09/15 creat 2.75 10/16/15 creat 2.6

## 2015-11-12 NOTE — Assessment & Plan Note (Signed)
Heart rate is in control, takes Amiodarone and Carvedilol. Takes Eliquis for thromboembolic risk reduction, off Norvasc in setting of edema

## 2015-11-12 NOTE — Assessment & Plan Note (Signed)
Stable, last Hgb 14/08/18/15, 11.4 10/05/15

## 2015-11-12 NOTE — Assessment & Plan Note (Signed)
Stable, continue Omeprazole 20mg daily.  

## 2015-11-12 NOTE — Assessment & Plan Note (Signed)
Blood pressure is controlled, Continue Carvedilol 25mg  bid, Furosemide 40mg  bid, Spironolactone 25mg 

## 2015-11-12 NOTE — Progress Notes (Signed)
Patient ID: Barry Thompson, male   DOB: Sep 16, 1923, 82 y.o.   MRN: [id number]  Location:  [facility] Room Number: 111 Place of Service:  SNF (31) Provider: Lennie Odor Dontavia Brand NP  GREEN, Viviann Spare, MD  Patient Care Team: Estill Dooms, MD as PCP - General (Internal Medicine) Anson Crofts, MD as Attending Physician (Cardiothoracic Surgery)  Extended Emergency Contact Information Primary Emergency Contact: [name],[name] Address: [address] [place]          [place], [place] [address] [place] Phone: [phone] Relation: Spouse Secondary Emergency Contact: [place], [name] [address] [place] Phone: [phone] Relation: Son  Code Status:  DNR Goals of care: Advanced Directive information Advanced Directives 10/20/2015  Does patient have an advance directive? Yes  Type of Advance Directive [place]  Does patient want to make changes to advanced directive? No - Patient declined  Copy of advanced directive(s) in chart? Yes     Chief Complaint  Patient presents with  . Medical Management of Chronic Issues    Routine Visit    HPI:  Pt is a 82 y.o. male seen today for medical management of chronic diseases.  HTN (hypertension) (Chronic). Blood pressure is controlled, off Norvasc in setting of BLE edema, Carvedilol 25mg  bid, newly added Furosemide 40mg  bid, Spironolactone 25mg . Atrial fibrillation   (Chronic). Heart rate is in control, takes Amiodarone and Carvedilol. Takes Eliquis for thromboembolic risk reduction. Chronic diastolic congestive heart failure (Chronic). Decompensating, , clear lungs, and increased peripheral edema, takes Furosemide 40mg  bid, Spironolactone 25mg . Memory loss (Chronic). Takes Aricept, admitted to [facility] related to progression of the disease and he is no longer self sufficient in AL setting. Hypothyroidism (Chronic). Takes Levothyroxine 179mcg daily. Last TSH 5.31  08/16/15. Malignant neoplasm of prostate. Sleeps 8 hours at night without urination. Also no urinary retention. Vasomotor rhinitis - Been several month, associated with eating mainly at dinner time, clear nasal drainage and post nasal drainage, denied chest pain or cough at night or SOB. Flonase with little effect. Atrovent nasal spray I each nostril ac dinner. Also may treat possible GERD with Omeprazole. Takes Haldol 0.5mg  bid for agitation which is stabilized, may consider dose reduction.    Persisted BLE edema, weight #[id number]#[id number]#253 in range in the past week, Furosemide 40mg  bid since 10/03/15 and Spironolactone 25mg , no O2 desaturation, no increased cough or SOB, the patient stated he is in his usual state of health. BNP 415.2 10/09/15. CKD worse, diuretics contributory, creat has trended up from 1.7 to 2.7   Past Medical History  Diagnosis Date  . Hypertension   . Hyperlipidemia   . Aortic stenosis     Moderately Severe by echo 07/2011  . 1St degree AV block   . Shortness of breath   . Angina   . Chronic kidney disease     renal insufficiency  . Pleural effusion     07/2011 - Left > Right - tapped (L) - Transudative  . Atrial fibrillation ([place])     07/2011 - amio and Rivaroxaban initiated - tee/dccv  . Diastolic CHF, chronic ([place])     07/2011 NL EF by echo  . Orthostatic hypotension   . Coronary artery disease 01/19/2012  . Cardiac pacemaker in situ 06/01/2012  . Unspecified hypothyroidism 09/24/2011  . Anxiety state, unspecified 09/24/2011  . Unspecified cataract 02/04/2011  . Disturbance of  salivary secretion 07/25/2009  . Edema 09/05/2008  . Shortness of breath 09/05/2008  . 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 Insomnia, unspecified 05/09/2005  . Urinary frequency 05/09/2005  . Malignant neoplasm of prostate ([place]) 03/04/2005  . Hypertrophy of prostate without urinary obstruction and other lower urinary tract symptoms (LUTS) 02/12/2005  . Other malaise and fatigue 02/12/2005    . Nocturia 02/22/2003  . Disturbances of sensation of smell and taste 03/16/2001  . Other specified cardiac dysrhythmias(427.89) 11/19/1999  . Anemia, unspecified   . Deaf   . Tremor 10/29/2013  . Memory loss 10/29/2013  . Hyperglycemia    Past Surgical History  Procedure Laterality Date  . Tee without cardioversion  07/17/2011    Procedure: TRANSESOPHAGEAL ECHOCARDIOGRAM (TEE);  Surgeon: Lelon Perla, MD;  Location: [facility] ENDOSCOPY;  Service: Cardiovascular;  Laterality: N/A;  With Cardioversion  . Hemorroidectomy  1968  . Spine surgery  1993    HNP(RUPTURED DISC)[name]  . Prostate surgery  05/26/2005    Dr. Terance Hart  . Eye surgery  2008    left eye ( Dr. Charise Killian)  . Esophagogastroduodenoscopy N/A 04/15/2013    Procedure: ESOPHAGOGASTRODUODENOSCOPY (EGD);  Surgeon: Cleotis Nipper, MD;  Location: [facility] ENDOSCOPY;  Service: Endoscopy;  Laterality: N/A;  . Esophagogastroduodenoscopy N/A 04/18/2013    Procedure: ESOPHAGOGASTRODUODENOSCOPY (EGD);  Surgeon: Wonda Horner, MD;  Location: [facility] ENDOSCOPY;  Service: Endoscopy;  Laterality: N/A;    Allergies  Allergen Reactions  . Mold Extract [Trichophyton]       Medication List       This list is accurate as of: 11/07/2015  2:24 PM.  Always use your most recent med list.               ALPRAZolam 0.25 MG tablet  Commonly known as:  XANAX  TAKE 1 TABLET UP TO 3 TIMES A DAY AS NEEDED FOR NERVES.     amiodarone 200 MG tablet  Commonly known as:  PACERONE  TAKE 1 TABLET ONCE DAILY.     atorvastatin 20 MG tablet  Commonly known as:  LIPITOR  Take one tablet by mouth once daily for cholesterol     carvedilol 25 MG tablet  Commonly known as:  COREG  TAKE 2 TABLETS BY MOUTH EVERY 12 HOURS FOR BLOOD PRESSURE.     donepezil 10 MG tablet  Commonly known as:  ARICEPT  TAKE 1 TABLET ONCE DAILY FOR MEMORY.     ELIQUIS 2.5 MG Tabs tablet  Generic drug:  apixaban  TAKE 1 TABLET TWICE A DAY FOR ANTI COAGULATION.     FISH OIL PO   Take 1 tablet by mouth every evening. Takes 200 mg tablet by mouth daily.     furosemide 40 MG tablet  Commonly known as:  LASIX  TAKE 1 TABLET ONCE DAILY TO CONTROL EDEMA.     haloperidol 0.5 MG tablet  Commonly known as:  HALDOL  Take 0.5 mg by mouth. Take one tab in the am and at 5 pm     ipratropium-albuterol 0.5-2.5 (3) MG/3ML Soln  Commonly known as:  DUONEB  Take 3 mLs by nebulization 4 (four) times daily as needed.     levothyroxine 100 MCG tablet  Commonly known as:  SYNTHROID, LEVOTHROID  TAKE 1 TABLET DAILY FOR THYROID.     NAMENDA XR 28 MG Cp24 24 hr capsule  Generic drug:  memantine  Take 28 mg by mouth daily.     omeprazole 20 MG capsule  Commonly known as:  PRILOSEC  Take 1 capsule (20 mg total) by mouth daily. For stomach     senna 8.6 MG Tabs tablet  Commonly known as:  SENOKOT  Take 2 tablets by mouth at bedtime.     spironolactone 25 MG tablet  Commonly known as:  ALDACTONE  TAKE 1 TABLET BY MOUTH NIGHTLY     tamsulosin 0.4 MG Caps capsule  Commonly known as:  FLOMAX  Take 0.4 mg by mouth. Take 1 cap by mouth one half hour after same meal daily     VITAMIN B-12 PO  Take 1 tablet by mouth every evening.     zolpidem 10 MG tablet  Commonly known as:  AMBIEN  TAKE 1 TABLET AT BEDTIME AS NEEDED FOR SLEEP.        Review of Systems  Constitutional: Positive for fatigue and unexpected weight change. Negative for activity change and appetite change.       Weight gain  HENT: Positive for hearing loss and tinnitus. Negative for congestion and ear pain.        Clear nasal drainage associated with eating mainly at dinner time.   Eyes: Negative.   Respiratory: Negative for cough, chest tightness and shortness of breath.        History of dyspnea in the past typically with exertion. He feels he is doing better with his.  Cardiovascular: Positive for leg swelling. Negative for chest pain and palpitations.       History of aortic valve replacement.  History of pacemaker implantation. 2+ edema BLE  Gastrointestinal: Positive for constipation. Negative for abdominal pain and abdominal distention.       Poor appetite.  Endocrine: Negative.   Genitourinary:       History prostate cancer, urinary frequency, and urgency. He is impotent. He has nocturia x2-3. In the daytime he voids every 90 minutes.  Musculoskeletal: Negative.  Negative for neck pain and neck stiffness.  Skin: Positive for pallor. Negative for rash and wound.       New skin tear left forearm appears more irritated according to wife and patient.  Neurological: Positive for tremors. Negative for syncope, weakness and numbness.       Memory loss  Hematological: Negative.   Psychiatric/Behavioral: Positive for confusion. The patient is nervous/anxious.     Immunization History  Administered Date(s) Administered  . Influenza Whole 07/14/2009  . Influenza,inj,Quad PF,36+ Mos 04/13/2013, 06/21/2015  . Influenza-Unspecified 04/13/2014  . PPD Test 09/05/2015  . Pneumococcal Conjugate-13 07/14/2005  . Td 02/29/2012   Pertinent  Health Maintenance Due  Topic Date Due  . PNA vac Low Risk Adult (2 of 2 - PPSV23) 07/14/2006  . INFLUENZA VACCINE  02/12/2016   Fall Risk  03/16/2015 02/15/2015 04/13/2014 01/05/2013  Falls in the past year? No No No No   Functional Status Survey:    Filed Vitals:   11-13-2015 1107  BP: 128/78  Pulse: 54  Temp: 96 F (35.6 C)  TempSrc: Oral  Resp: 20  Height: 5\' 2"  (1.575 m)  Weight: 253 lb (114.76 kg)  SpO2: 97%   Body mass index is 46.26 kg/(m^2). Physical Exam  Constitutional: He is oriented to person, place, and time. He appears well-developed and well-nourished. No distress.  HENT:  Head: Normocephalic and atraumatic.  Nose: Nose normal.  Mouth/Throat: Oropharynx is clear and moist.  Partial deafness bilaterally.  Eyes: Conjunctivae and EOM are normal. Pupils are equal, round, and reactive to light.  Wears corrective lenses.  Neck:  No JVD present. No tracheal deviation present. No thyromegaly present.  Cardiovascular: Normal rate, regular rhythm and intact distal pulses.  Exam reveals no gallop and no friction rub.   2/6 systolic ejection murmur at the base of the heart. Pacemaker left upper anterior chest. Audible systolic click.  Pulmonary/Chest: No respiratory distress. He has no wheezes. He exhibits no tenderness.  Breathing is even and unlabored at rest.  Abdominal: He exhibits no distension and no mass. There is no tenderness.  Musculoskeletal: Normal range of motion. He exhibits edema. He exhibits no tenderness.  2+ edema BLE LLE>RLL  Lymphadenopathy:    He has no cervical adenopathy.  Neurological: He is alert and oriented to person, place, and time. No cranial nerve deficit. Coordination normal.  Resting tremor bilaterally. Worse on the right side. 01/17/2009 MMSE 25/30. Passed clock drawing. 10/26/14 MMSE 23/30. Passed clock drawing.  Skin: No rash noted. No erythema. No pallor.  Multiple seborrheic keratoses. Common Nevus left neck posterior to the ear.  Psychiatric: He has a normal mood and affect. His behavior is normal. Judgment and thought content normal.    Labs reviewed:  Recent Labs  03/16/15 1455  08/18/15 1000 09/06/15 10/05/15 10/09/15  NA 140  < > 130* 137 134* 138  K 4.8  < > 5.2* 4.4 5.0 4.9  CL 95*  --  96*  --   --   --   CO2 26  --  21*  --   --   --   GLUCOSE 106*  --  97  --   --   --   BUN 26  < > 31* 29* 32* 62*  CREATININE 2.13*  < > 1.87* 1.8* 1.8* 2.8*  CALCIUM 9.5  --  9.0  --   --   --   < > = values in this interval not displayed.  Recent Labs  03/16/15 1455 06/14/15 08/16/15 08/18/15 1000  AST 28 53* 43* 77*  ALT 39 69* 59* 61  ALKPHOS 47 43 48 52  BILITOT 0.7  --   --  1.1  PROT 7.1  --   --  6.9  ALBUMIN 4.6  --   --  4.2    Recent Labs  03/16/15 1455  08/16/15 08/18/15 1000 10/05/15  WBC 6.4  < > 5.7 11.3* 5.2  NEUTROABS 4.5  --   --   --   --   HGB   --   < > 12.7* 14.0 11.4*  HCT 43.9  < > 39* 43.4 36*  MCV 89  --   --  89.3  --   PLT 155  < > 139* 149* 127*  < > = values in this interval not displayed. Lab Results  Component Value Date   TSH 5.31 08/16/2015   Lab Results  Component Value Date   HGBA1C 6.0 06/14/2015   Lab Results  Component Value Date   CHOL 127 10/19/2014   HDL 32* 10/19/2014   LDLCALC 67 10/19/2014   TRIG 138 10/19/2014   CHOLHDL 3.2 01/03/2013    Significant Diagnostic Results in last 30 days:  No results found.  Assessment/Plan  Anemia Stable, last Hgb 14/08/18/15, 11.4 10/05/15  Atrial fibrillation   Heart rate is in control, takes Amiodarone and Carvedilol. Takes Eliquis for thromboembolic risk reduction, off Norvasc in setting of edema   Chronic diastolic congestive heart failure ([place]) CXR 10/04/15 CHF 10/09/15 BNP 415.2, Na 138, K 4.9, Bun 62, creat 2.75 10/16/15  Na 139, K 4.8, Bun 57, creat 2.60 Weights #[id number], no increased SOB or O2 desaturation, continue Furosemide 40mg  bid, Spironolactone 25mg . Obtain echocardiogram, f/u BMP/BNP  Chronic kidney disease, stage IV (severe) (HCC) 09/06/15 creat 1.76 10/05/15 creat 1.75 10/09/15 creat 2.75 10/16/15 creat 2.6  Dementia with behavioral disturbance Continue SNF, [facility] for care needs, continue Aricept, last MMSE 20/30 a few weeks ago, 18/30 09/05/15. Decrease Haldol to 0.5mg  daily  GERD (gastroesophageal reflux disease) Stable, continue Omeprazole 20mg  daily.   HTN (hypertension) Blood pressure is controlled, Continue Carvedilol 25mg  bid, Furosemide 40mg  bid, Spironolactone 25mg    Hypothyroidism Continue Levothyroxine 135mcg daily. 08/16/15 TSH 5.31. Monitor. TSH in 8 weeks.   Memory loss Continue SNF, [facility] for care needs, continue Aricept, last MMSE 20/30 a few weeks ago, 18/30 09/05/15    Family/ staff Communication: continue SNF for care needs. Plan of care meeting with family and staff conducted at 11am   Labs/tests ordered:  BMP, BNP,  echocardiogram.

## 2015-11-12 NOTE — Assessment & Plan Note (Signed)
Continue SNF, [facility] for care needs, continue Aricept, last MMSE 20/30 a few weeks ago, 18/30 09/05/15. Decrease Haldol to 0.5mg  daily

## 2015-11-12 NOTE — Assessment & Plan Note (Signed)
Continue Levothyroxine 120mcg daily. 08/16/15 TSH 5.31. Monitor. TSH in 8 weeks.

## 2015-11-12 NOTE — Assessment & Plan Note (Signed)
Continue SNF, [facility] for care needs, continue Aricept, last MMSE 20/30 a few weeks ago, 18/30 09/05/15

## 2015-11-12 NOTE — Assessment & Plan Note (Signed)
CXR 10/04/15 CHF 10/09/15 BNP 415.2, Na 138, K 4.9, Bun 62, creat 2.75 10/16/15 Na 139, K 4.8, Bun 57, creat 2.60 Weights #[id number], no increased SOB or O2 desaturation, continue Furosemide 40mg  bid, Spironolactone 25mg . Obtain echocardiogram, f/u BMP/BNP

## 2015-11-12 DEATH — deceased
